# Patient Record
Sex: Female | Born: 1950 | Race: White | Hispanic: No | Smoking: Former smoker
Health system: Southern US, Community
[De-identification: ages and names within clinical notes are randomized; demographics above are authoritative.]

## PROBLEM LIST (undated history)

## (undated) DIAGNOSIS — F419 Anxiety disorder, unspecified: Secondary | ICD-10-CM

## (undated) DIAGNOSIS — Z889 Allergy status to unspecified drugs, medicaments and biological substances status: Secondary | ICD-10-CM

## (undated) DIAGNOSIS — J069 Acute upper respiratory infection, unspecified: Secondary | ICD-10-CM

## (undated) DIAGNOSIS — T7840XA Allergy, unspecified, initial encounter: Secondary | ICD-10-CM

## (undated) DIAGNOSIS — K589 Irritable bowel syndrome without diarrhea: Secondary | ICD-10-CM

## (undated) DIAGNOSIS — I10 Essential (primary) hypertension: Secondary | ICD-10-CM

## (undated) DIAGNOSIS — M1990 Unspecified osteoarthritis, unspecified site: Secondary | ICD-10-CM

## (undated) HISTORY — DX: Irritable bowel syndrome, unspecified: K58.9

## (undated) HISTORY — DX: Allergy status to unspecified drugs, medicaments and biological substances: Z88.9

## (undated) HISTORY — DX: Anxiety disorder, unspecified: F41.9

## (undated) HISTORY — PX: SINUS SURGERY WITH INSTATRAK: SHX5215

## (undated) HISTORY — DX: Acute upper respiratory infection, unspecified: J06.9

## (undated) HISTORY — PX: CHOLECYSTECTOMY: SHX55

## (undated) HISTORY — DX: Unspecified osteoarthritis, unspecified site: M19.90

## (undated) HISTORY — PX: ABDOMINAL HYSTERECTOMY: SHX81

## (undated) HISTORY — DX: Allergy, unspecified, initial encounter: T78.40XA

## (undated) HISTORY — DX: Essential (primary) hypertension: I10

## (undated) HISTORY — PX: SINOSCOPY: SHX187

---

## 1998-03-12 ENCOUNTER — Ambulatory Visit (HOSPITAL_COMMUNITY): Admission: RE | Admit: 1998-03-12 | Discharge: 1998-03-12 | Payer: Self-pay | Admitting: Internal Medicine

## 1998-07-25 ENCOUNTER — Encounter: Payer: Self-pay | Admitting: Internal Medicine

## 1998-07-25 ENCOUNTER — Ambulatory Visit (HOSPITAL_COMMUNITY): Admission: RE | Admit: 1998-07-25 | Discharge: 1998-07-25 | Payer: Self-pay | Admitting: Internal Medicine

## 2004-10-27 ENCOUNTER — Ambulatory Visit (HOSPITAL_COMMUNITY): Admission: RE | Admit: 2004-10-27 | Discharge: 2004-10-27 | Payer: Self-pay | Admitting: Family Medicine

## 2005-06-25 ENCOUNTER — Emergency Department (HOSPITAL_COMMUNITY): Admission: EM | Admit: 2005-06-25 | Discharge: 2005-06-26 | Payer: Self-pay | Admitting: *Deleted

## 2006-01-11 ENCOUNTER — Encounter (HOSPITAL_COMMUNITY): Admission: RE | Admit: 2006-01-11 | Discharge: 2006-02-10 | Payer: Self-pay | Admitting: Preventative Medicine

## 2008-05-16 ENCOUNTER — Ambulatory Visit: Payer: Self-pay | Admitting: Cardiology

## 2008-06-03 ENCOUNTER — Encounter: Payer: Self-pay | Admitting: Cardiology

## 2008-06-03 ENCOUNTER — Ambulatory Visit: Payer: Self-pay | Admitting: Cardiology

## 2008-06-03 DIAGNOSIS — I10 Essential (primary) hypertension: Secondary | ICD-10-CM | POA: Insufficient documentation

## 2008-06-03 DIAGNOSIS — R05 Cough: Secondary | ICD-10-CM

## 2008-06-03 DIAGNOSIS — R9430 Abnormal result of cardiovascular function study, unspecified: Secondary | ICD-10-CM | POA: Insufficient documentation

## 2008-06-03 DIAGNOSIS — F411 Generalized anxiety disorder: Secondary | ICD-10-CM | POA: Insufficient documentation

## 2008-06-03 DIAGNOSIS — K589 Irritable bowel syndrome without diarrhea: Secondary | ICD-10-CM | POA: Insufficient documentation

## 2008-06-03 DIAGNOSIS — K219 Gastro-esophageal reflux disease without esophagitis: Secondary | ICD-10-CM | POA: Insufficient documentation

## 2008-06-03 DIAGNOSIS — R059 Cough, unspecified: Secondary | ICD-10-CM | POA: Insufficient documentation

## 2008-06-03 DIAGNOSIS — R079 Chest pain, unspecified: Secondary | ICD-10-CM | POA: Insufficient documentation

## 2008-06-06 ENCOUNTER — Ambulatory Visit: Payer: Self-pay | Admitting: Cardiovascular Disease

## 2008-06-06 ENCOUNTER — Inpatient Hospital Stay (HOSPITAL_BASED_OUTPATIENT_CLINIC_OR_DEPARTMENT_OTHER): Admission: RE | Admit: 2008-06-06 | Discharge: 2008-06-06 | Payer: Self-pay | Admitting: Cardiovascular Disease

## 2008-07-09 ENCOUNTER — Ambulatory Visit: Payer: Self-pay | Admitting: Cardiology

## 2010-07-05 HISTORY — PX: SHOULDER SURGERY: SHX246

## 2010-11-17 NOTE — Cardiovascular Report (Signed)
NAMEHAZLEY, Misty Clark                  ACCOUNT NO.:  [id number]   MEDICAL RECORD NO.:  [id number]          PATIENT TYPE:  OIB   LOCATION:  1962                         FACILITY:  [facility]   PHYSICIAN:  Verne Carrow, MDDATE OF BIRTH:  05-12-1951   DATE OF PROCEDURE:  DATE OF DISCHARGE:  06/06/2008                            CARDIAC CATHETERIZATION   PRIMARY CARDIOLOGIST:  Learta Codding, MD,FACC   PRIMARY CARE PHYSICIAN:  Dr Ernestine Conrad.   PROCEDURES PERFORMED:  1. Left heart catheterization.  2. Selective coronary angiography.  3. Left ventricular angiogram.   OPERATOR:  Verne Carrow, MD   INDICATION:  Chest pain in a 60 year old Caucasian female with a past  medical history significant for irritable bowel syndrome, as well as  anxiety who has had recent episodes of chest heaviness related to  stressful events.  She underwent a nuclear stress study that showed some  ischemic EKG changes, however, no perfusion defects on this study.   DETAIL OF THE PROCEDURE:  The patient was brought into the outpatient  cardiac catheterization laboratory after signing informed consent for  the procedure.  The right groin was prepped and draped in the sterile  fashion.  A 1% lidocaine was used for local anesthesia.  The modified  Seldinger technique was used to obtain access to the right femoral  artery.  A 4-French sheath was placed in the right femoral artery  without difficulty.  A 4-French JL4 diagnostic catheter was used to  selectively engage and inject the left coronary system.  A 4-French 3DRC  catheter was used to selectively engage and inject the right coronary  artery.  A 4-French pigtail catheter was used across the aortic valve  into the left ventricle.  Following performance of the left ventricular  angiogram,  Pigtail catheter was pullback across the aortic valve with  no significant pressure gradient measured.  The patient tolerated the  procedure well and was taken to  the holding area in stable condition.   ANGIOGRAPHIC FINDINGS:  1. The left main coronary artery bifurcates into the circumflex and      the left anterior descending and has no evidence of disease.  2. Left anterior descending is a large vessel that becomes a moderate      size in the distal portion.  It gives off 3 diagonal branches that      are small to moderate size.  There is no evidence of disease in the      left anterior descending system.  3. Circumflex artery is a moderate-to-large size vessel that gives off      a small early obtuse marginal branch and 2 larger more distal      obtuse marginal branches.  The AV groove circumflex becomes small.      There is no evidence of disease in the circumflex system.  4. The right coronary artery is a large vessel that is dominant.  This      bifurcates into a moderate-sized posterior descending branch and a      moderate-sized posterolateral segment.  There is no evidence of  disease in the right coronary artery.  5. The left ventricular angiogram demonstrates normal left ventricular      systolic function with no wall motion abnormalities.  Ejection      fraction is estimated at 55%.  There is no evidence of mitral      regurgitation.   HEMODYNAMIC DATA:  Central aortic pressure 139/73.  Left ventricular  pressure 145/11, end-diastolic pressure 14.   IMPRESSION:  1. No angiographic evidence of coronary artery disease.  2. Normal left ventricular systolic function.   RECOMMENDATIONS:  No further cardiac workup is indicated at the current  time.  The patient will follow up with Dr. Andee Lineman in 1-2 weeks.      Verne Carrow, MD  Electronically Signed     CM/MEDQ  D:  06/06/2008  T:  06/06/2008  Job:  [id number]   cc:   Learta Codding, MD,FACC  Dr Ernestine Conrad

## 2010-11-17 NOTE — Assessment & Plan Note (Signed)
[facility]                          [facility] OFFICE NOTE   NAME:FULP, DONIESHA LANDAU                         MRN:          [id number]  DATE:07/09/2008                            DOB:          Mar 19, 1951    CARDIOLOGIST:  Learta Codding, MD,FACC   PRIMARY CARE PHYSICIAN:  Dr. Ernestine Conrad   REASON FOR VISIT:  Postcatheterization followup.   HISTORY OF PRESENT ILLNESS:  Ms. Jillyn Hidden is a 60 year old female patient  who recently saw Dr. Andee Lineman secondary to chest pain and shortness of  breath.  She had an exercise Cardiolite study with EKG changes  suggestive of ischemia, but her nuclear images were normal.  Proceeding  with stress echocardiogram versus cardiac catheterization was discussed  with the patient.  She preferred cardiac catheterization for definitive  diagnosis.  Her cardiac catheterization was done by Dr. Clifton James on  June 06, 2008, and demonstrated EF of 55% and no angiographic  evidence of coronary artery disease.  Her central aortic pressure is  139/73 and her LV pressure was 145/11 and her end-diastolic pressure was  14.  In the office today, the patient notes that she is doing well.  She  denies any further chest pain, but continues to shortness of breath with  exertion.  She describes NYHA class II to class IIB symptoms.  She  denies orthopnea, PND.  She denies pedal edema.  She continues to note a  nonproductive cough.  She notes occasional wheezing.  She denies any  further dyspepsia.  She has been off of her proton pump inhibitor for  quite some time now.  She did have a coughing spell few days ago and she  developed significant pain in her lower right chest.  She continues to  note discomfort in that region with certain positioning changes.  She  has been taking ibuprofen with some relief.  Overall, her pain has  improved since it began.   CURRENT MEDICATIONS:  1. Celexa 20 mg daily.  2. Aspirin 81 mg daily.  3. Xanax 0.25 mg at  bedtime.  4. Tylenol p.r.n.  5. Ibuprofen p.r.n.  6. Zyrtec p.r.n.  7. Sudafed p.r.n.   ALLERGIES:  SULFA, CODEINE, AUGMENTIN, BACTRIM.   PHYSICAL EXAMINATION:  GENERAL:  She is a well-nourished, well-developed  female in no distress.  VITAL SIGNS:  Blood pressure is 154/92, pulse 91, weight 163.6 pounds.  HEENT:  Normal.  NECK:  Without JVD.  CARDIAC:  S1 and S2.  Regular rate and rhythm.  LUNGS:  Clear to auscultation bilaterally.  No wheezing, no rhonchi, no  rales.  ABDOMEN:  Soft, nontender.  EXTREMITIES:  No edema.  NEUROLOGIC:  She is alert and oriented x3.  Cranial II through XII  grossly intact.  VASCULAR:  Right femoral arteriotomy site without hematoma or bruit.  CHEST:  She has some tenderness over the lower ribs on the right.  No  obvious deformity is noted.   ASSESSMENT/PLAN:  1. Noncardiac chest pain and shortness of breath.  As noted above, the      patient had normal heart  catheterization with normal left      ventricular function and normal left-sided pressures.  No further      cardiovascular testing is warranted at this time.  2. Chronic cough.  The patient notes occasional wheezing and I      questioned whether or not she may have an element of reactive      airway disease or asthma.  I have asked her to follow up further      with Dr. Loney Hering for further testing.  3. Hypertension.  She has had several readings of blood pressures over      140/90.  She will require medical therapy.  I have recommended the      patient to return to see her primary care physician for treatment      of her hypertension.  4. Chest pain.  She is concerned that she cracked her rib with her      coughing the other day.  Her symptoms have improved, but she is      still tender over that area.  We will set her up for rib films to      rule out the possibility of rib fracture.  Otherwise, she will      continue followup with her primary care physician  5. Gastroesophageal reflux  disease.  The patient denies any further      symptoms of dyspepsia since discontinuing her Prilosec some weeks      ago.  Further treatment of this will be per her primary care      physician.   DISPOSITION:  We will follow up on her chest x-ray.  She can follow up  with Cardiology on a p.r.n. basis.  She should continue followup with  her primary care physician as outlined above.      Tereso Newcomer, PA-C  Electronically Signed      Jonelle Sidle, MD  Electronically Signed   SW/MedQ  DD: 07/09/2008  DT: 07/10/2008  Job #: [id number]   cc:   Dr. Ellin Mayhew, MD,FACC

## 2011-01-07 ENCOUNTER — Encounter: Payer: Self-pay | Admitting: Cardiology

## 2015-06-02 DIAGNOSIS — Z1211 Encounter for screening for malignant neoplasm of colon: Secondary | ICD-10-CM | POA: Diagnosis not present

## 2015-06-02 DIAGNOSIS — E559 Vitamin D deficiency, unspecified: Secondary | ICD-10-CM | POA: Diagnosis not present

## 2015-06-02 DIAGNOSIS — Z124 Encounter for screening for malignant neoplasm of cervix: Secondary | ICD-10-CM | POA: Diagnosis not present

## 2015-06-02 DIAGNOSIS — Z0000 Encounter for general adult medical examination without abnormal findings: Secondary | ICD-10-CM | POA: Diagnosis not present

## 2015-06-02 DIAGNOSIS — Z1231 Encounter for screening mammogram for malignant neoplasm of breast: Secondary | ICD-10-CM | POA: Diagnosis not present

## 2015-06-02 DIAGNOSIS — E7801 Familial hypercholesterolemia: Secondary | ICD-10-CM | POA: Diagnosis not present

## 2015-06-02 DIAGNOSIS — Z23 Encounter for immunization: Secondary | ICD-10-CM | POA: Diagnosis not present

## 2015-06-02 DIAGNOSIS — I10 Essential (primary) hypertension: Secondary | ICD-10-CM | POA: Diagnosis not present

## 2015-06-11 DIAGNOSIS — R300 Dysuria: Secondary | ICD-10-CM | POA: Diagnosis not present

## 2015-06-12 DIAGNOSIS — Z1231 Encounter for screening mammogram for malignant neoplasm of breast: Secondary | ICD-10-CM | POA: Diagnosis not present

## 2015-10-03 DIAGNOSIS — J0100 Acute maxillary sinusitis, unspecified: Secondary | ICD-10-CM | POA: Diagnosis not present

## 2016-01-02 DIAGNOSIS — H40053 Ocular hypertension, bilateral: Secondary | ICD-10-CM | POA: Diagnosis not present

## 2016-04-13 DIAGNOSIS — Z0101 Encounter for examination of eyes and vision with abnormal findings: Secondary | ICD-10-CM | POA: Diagnosis not present

## 2016-06-18 DIAGNOSIS — Z1231 Encounter for screening mammogram for malignant neoplasm of breast: Secondary | ICD-10-CM | POA: Diagnosis not present

## 2016-07-14 DIAGNOSIS — J0100 Acute maxillary sinusitis, unspecified: Secondary | ICD-10-CM | POA: Diagnosis not present

## 2016-07-28 DIAGNOSIS — Z23 Encounter for immunization: Secondary | ICD-10-CM | POA: Diagnosis not present

## 2016-07-28 DIAGNOSIS — E7801 Familial hypercholesterolemia: Secondary | ICD-10-CM | POA: Diagnosis not present

## 2016-07-28 DIAGNOSIS — E559 Vitamin D deficiency, unspecified: Secondary | ICD-10-CM | POA: Diagnosis not present

## 2016-07-28 DIAGNOSIS — J209 Acute bronchitis, unspecified: Secondary | ICD-10-CM | POA: Diagnosis not present

## 2016-07-28 DIAGNOSIS — Z0000 Encounter for general adult medical examination without abnormal findings: Secondary | ICD-10-CM | POA: Diagnosis not present

## 2016-07-28 DIAGNOSIS — Z124 Encounter for screening for malignant neoplasm of cervix: Secondary | ICD-10-CM | POA: Diagnosis not present

## 2016-07-28 DIAGNOSIS — I10 Essential (primary) hypertension: Secondary | ICD-10-CM | POA: Diagnosis not present

## 2016-07-28 DIAGNOSIS — Z1231 Encounter for screening mammogram for malignant neoplasm of breast: Secondary | ICD-10-CM | POA: Diagnosis not present

## 2016-07-28 DIAGNOSIS — Z1211 Encounter for screening for malignant neoplasm of colon: Secondary | ICD-10-CM | POA: Diagnosis not present

## 2016-07-28 DIAGNOSIS — Z6826 Body mass index (BMI) 26.0-26.9, adult: Secondary | ICD-10-CM | POA: Diagnosis not present

## 2016-07-30 DIAGNOSIS — R05 Cough: Secondary | ICD-10-CM | POA: Diagnosis not present

## 2016-09-20 DIAGNOSIS — R300 Dysuria: Secondary | ICD-10-CM | POA: Diagnosis not present

## 2016-09-27 DIAGNOSIS — J309 Allergic rhinitis, unspecified: Secondary | ICD-10-CM | POA: Diagnosis not present

## 2016-09-27 DIAGNOSIS — R300 Dysuria: Secondary | ICD-10-CM | POA: Diagnosis not present

## 2017-03-24 DIAGNOSIS — Z79899 Other long term (current) drug therapy: Secondary | ICD-10-CM | POA: Diagnosis not present

## 2017-03-24 DIAGNOSIS — J209 Acute bronchitis, unspecified: Secondary | ICD-10-CM | POA: Diagnosis not present

## 2017-03-24 DIAGNOSIS — Z8249 Family history of ischemic heart disease and other diseases of the circulatory system: Secondary | ICD-10-CM | POA: Diagnosis not present

## 2017-03-24 DIAGNOSIS — I10 Essential (primary) hypertension: Secondary | ICD-10-CM | POA: Diagnosis not present

## 2017-03-24 DIAGNOSIS — R05 Cough: Secondary | ICD-10-CM | POA: Diagnosis not present

## 2017-03-24 DIAGNOSIS — Z87891 Personal history of nicotine dependence: Secondary | ICD-10-CM | POA: Diagnosis not present

## 2017-05-03 DIAGNOSIS — H524 Presbyopia: Secondary | ICD-10-CM | POA: Diagnosis not present

## 2017-10-14 DIAGNOSIS — R05 Cough: Secondary | ICD-10-CM | POA: Diagnosis not present

## 2017-10-14 DIAGNOSIS — J40 Bronchitis, not specified as acute or chronic: Secondary | ICD-10-CM | POA: Diagnosis not present

## 2017-11-04 DIAGNOSIS — R42 Dizziness and giddiness: Secondary | ICD-10-CM | POA: Diagnosis not present

## 2017-11-04 DIAGNOSIS — H6982 Other specified disorders of Eustachian tube, left ear: Secondary | ICD-10-CM | POA: Diagnosis not present

## 2017-11-04 DIAGNOSIS — I10 Essential (primary) hypertension: Secondary | ICD-10-CM | POA: Diagnosis not present

## 2017-11-09 NOTE — Progress Notes (Deleted)
Subjective: WU:[id number] care, *** HPI: Misty Clark is a 67 y.o. female presenting to clinic today for:  1. ***  Past Medical History:  Diagnosis Date  . Anxiety   . Arthritis   . IBS (irritable bowel syndrome)    Mixed  . Multiple allergies    *** The histories are not reviewed yet. Please review them in the "History" navigator section and refresh this SmartLink. Social History   Socioeconomic History  . Marital status: Widowed    Spouse name: Not on file  . Number of children: Not on file  . Years of education: Not on file  . Highest education level: Not on file  Occupational History  . Not on file  Social Needs  . Financial resource strain: Not on file  . Food insecurity:    Worry: Not on file    Inability: Not on file  . Transportation needs:    Medical: Not on file    Non-medical: Not on file  Tobacco Use  . Smoking status: Former Smoker    Packs/day: 0.50  . Tobacco comment: Quit many years ago.  Substance and Sexual Activity  . Alcohol use: No  . Drug use: Not on file  . Sexual activity: Not on file  Lifestyle  . Physical activity:    Days per week: Not on file    Minutes per session: Not on file  . Stress: Not on file  Relationships  . Social connections:    Talks on phone: Not on file    Gets together: Not on file    Attends religious service: Not on file    Active member of club or organization: Not on file    Attends meetings of clubs or organizations: Not on file    Relationship status: Not on file  . Intimate partner violence:    Fear of current or ex partner: Not on file    Emotionally abused: Not on file    Physically abused: Not on file    Forced sexual activity: Not on file  Other Topics Concern  . Not on file  Social History Narrative  . Not on file   No outpatient medications have been marked as taking for the 11/14/17 encounter (Appointment) with Raliegh Ip, DO.   Family History  Problem Relation Age of Onset  .  Breast cancer Sister   . Heart failure Maternal Grandmother        CHF  . Heart attack Paternal Grandfather        MI  . Coronary artery disease Paternal Grandfather        At a premature age  . Coronary artery disease Other    Allergies  Allergen Reactions  . Amoxicillin-Pot Clavulanate     REACTION: GI upset and rash  . Codeine     REACTION: vomiting  . Sulfonamide Derivatives     REACTION: rash     Health Maintenance: ***  Flu Vaccine: {YES/NO/WILD GNFAO:13086}  Tdap Vaccine: {YES/NO/WILD VHQIO:96295}  - every 34yrs - (<3 lifetime doses or unknown): all wounds -- look up need for Tetanus IG - (>=3 lifetime doses): clean/minor wound if >54yrs from previous; all other wounds if >95yrs from previous Zoster Vaccine: {YES/NO/WILD CARDS:18581} (those >50yo, once) Pneumonia Vaccine: {YES/NO/WILD MWUXL:24401} (those w/ risk factors) - (<30yr) Both: Immunocompromised, cochlear implant, CSF leak, asplenic, sickle cell, Chronic Renal Failure - (<5yr) PPSV-23 only: Heart dz, lung disease, DM, tobacco abuse, alcoholism, cirrhosis/liver disease. - (>42yr): PPSV13 then PPSV23  in 6-12mths;  - (>61yr): repeat PPSV23 once if pt received prior to 68yo and 24yrs have passed  ROS: Per HPI  Objective: Office vital signs reviewed. There were no vitals taken for this visit.  Physical Examination:  General: Awake, alert, *** nourished, No acute distress HEENT: Normal    Neck: No masses palpated. No lymphadenopathy    Ears: Tympanic membranes intact, normal light reflex, no erythema, no bulging    Eyes: PERRLA, extraocular movement in tact, sclera ***    Nose: nasal turbinates moist, *** nasal discharge    Throat: moist mucus membranes, no erythema, *** tonsillar exudate.  Airway is patent Cardio: regular rate and rhythm, S1S2 heard, no murmurs appreciated Pulm: clear to auscultation bilaterally, no wheezes, rhonchi or rales; normal work of breathing on room air GI: soft, non-tender,  non-distended, bowel sounds present x4, no hepatomegaly, no splenomegaly, no masses GU: external vaginal tissue ***, cervix ***, *** punctate lesions on cervix appreciated, *** discharge from cervical os, *** bleeding, *** cervical motion tenderness, *** abdominal/ adnexal masses Extremities: warm, well perfused, No edema, cyanosis or clubbing; +*** pulses bilaterally MSK: *** gait and *** station Skin: dry; intact; no rashes or lesions Neuro: *** Strength and light touch sensation grossly intact, *** DTRs ***/4  Assessment/ Plan: 67 y.o. female   No problem-specific Assessment & Plan notes found for this encounter.   Raliegh Ip, DO [facility] [phone]

## 2017-11-14 ENCOUNTER — Ambulatory Visit: Payer: Self-pay | Admitting: Family Medicine

## 2017-11-18 ENCOUNTER — Ambulatory Visit: Payer: Medicare HMO | Admitting: Family Medicine

## 2017-11-18 ENCOUNTER — Other Ambulatory Visit: Payer: Self-pay | Admitting: Family Medicine

## 2017-11-18 ENCOUNTER — Encounter: Payer: Self-pay | Admitting: Family Medicine

## 2017-11-18 VITALS — BP 138/82 | HR 96 | Temp 97.3°F | Ht 65.0 in | Wt 158.0 lb

## 2017-11-18 DIAGNOSIS — Z13220 Encounter for screening for lipoid disorders: Secondary | ICD-10-CM

## 2017-11-18 DIAGNOSIS — Z1211 Encounter for screening for malignant neoplasm of colon: Secondary | ICD-10-CM

## 2017-11-18 DIAGNOSIS — Z7689 Persons encountering health services in other specified circumstances: Secondary | ICD-10-CM

## 2017-11-18 DIAGNOSIS — J301 Allergic rhinitis due to pollen: Secondary | ICD-10-CM | POA: Diagnosis not present

## 2017-11-18 DIAGNOSIS — Z8349 Family history of other endocrine, nutritional and metabolic diseases: Secondary | ICD-10-CM

## 2017-11-18 DIAGNOSIS — F411 Generalized anxiety disorder: Secondary | ICD-10-CM

## 2017-11-18 DIAGNOSIS — Z1329 Encounter for screening for other suspected endocrine disorder: Secondary | ICD-10-CM

## 2017-11-18 DIAGNOSIS — Z130 Encounter for screening for diseases of the blood and blood-forming organs and certain disorders involving the immune mechanism: Secondary | ICD-10-CM | POA: Diagnosis not present

## 2017-11-18 DIAGNOSIS — I10 Essential (primary) hypertension: Secondary | ICD-10-CM

## 2017-11-18 DIAGNOSIS — R69 Illness, unspecified: Secondary | ICD-10-CM | POA: Diagnosis not present

## 2017-11-18 MED ORDER — LORATADINE 10 MG PO TABS: 10.0000 mg | ORAL_TABLET | Freq: Every day | ORAL | 11 refills | Status: DC

## 2017-11-18 MED ORDER — TRIAMTERENE-HCTZ 37.5-25 MG PO TABS: 1.0000 | ORAL_TABLET | Freq: Every day | ORAL | 0 refills | Status: DC

## 2017-11-18 MED ORDER — CITALOPRAM HYDROBROMIDE 20 MG PO TABS: 20.0000 mg | ORAL_TABLET | Freq: Every day | ORAL | 0 refills | Status: DC

## 2017-11-18 MED ORDER — ALPRAZOLAM 0.5 MG PO TABS: 0.2500 mg | ORAL_TABLET | Freq: Two times a day (BID) | ORAL | 0 refills | Status: DC | PRN

## 2017-11-18 NOTE — Patient Instructions (Addendum)
Stop lisinopril HCTZ.  Start the triamterene/HCTZ.  Monitor your blood pressures daily.  Your goal blood pressure is less than 150/90.  If you are seeing blood pressures less than 110/60, please contact the office.  I have refilled the Celexa and the Xanax.  We discussed that Xanax is for use in severe cases only.  Do not operate heavy machinery, do not drive, do not drink alcohol while taking.  This medication is only intended to aid you in any panic attacks while we get you back on Celexa.  I have ordered the colon cancer screening kit for you.  You will be contacted about this.  It takes about 2 to 3 weeks to receive the results once you send it back.  If it is negative, we will plan to check for colon cancer every 3 years.  Schedule your mammogram when you check out today.  Plan to see me back in 4 weeks for anxiety and blood pressure.  Taking the medicine as directed and not missing any doses is one of the best things you can do to treat your anxiety.  Here are some things to keep in mind:  1) Side effects (stomach upset, some increased anxiety) may happen before you notice a benefit.  These side effects typically go away over time. 2) Changes to your dose of medicine or a change in medication all together is sometimes necessary 3) Most people need to be on medication at least 12 months 4) Many people will notice an improvement within two weeks but the full effect of the medication can take up to 4-6 weeks 5) Stopping the medication when you start feeling better often results in a return of symptoms 6) Never discontinue your medication without contacting a health care professional first.  Some medications require gradual discontinuation/ taper and can make you sick if you stop them abruptly.  If your symptoms worsen or you have thoughts of suicide/homicide, PLEASE SEEK IMMEDIATE MEDICAL ATTENTION.  You may always call:  National Suicide Hotline: (424) 827-6677 [place]:  774-637-7742 Crisis Recovery in [place]: 512-851-1930   These are available 24 hours a day, 7 days a week.

## 2017-11-18 NOTE — Assessment & Plan Note (Addendum)
Does not appear very anxious on exam.  However, I do think that she has true panic symptoms that are related to significant stressors.  She seemed to do well on Celexa in the past.  I have also given her a small supply of Xanax to use over the next couple of weeks if needed for severe symptoms.  We discussed proper use of this medication.  Caution sedation.  We discussed that this is a one-time prescription while we get her back onto the Celexa.  The narcotic database was reviewed and there were no red flags.  Last fill was in 2018.  I have encouraged her to continue with therapy.  She will follow-up in 4 weeks for recheck.

## 2017-11-18 NOTE — Progress Notes (Signed)
Subjective: UY:[id number] care, HTN HPI: Misty Clark is a 67 y.o. female presenting to clinic today for:  1. Hypertension Patient reports Blood pressure at home: 140-160/80-90s; she reports that she is actually been using lisinopril/HCTZ every other day because she gets cramping in her lower extremities with this medication.  She notes that she was previously on triamterene/HCTZ, which she tolerated very well.  Lisinopril/HCTZ was prescribed by urgent care whom she saw for an upper respiratory infection.  She would like to go back on her previous blood pressure medicine. Denies headache, dizziness, visual changes, nausea, vomiting, chest pain, LE swelling, abdominal pain or shortness of breath.  2.  Anxiety Patient notes that she has had issues with anxiety and panic since the passing of her sister 5 years ago.  She notes her sister passed away from metastatic breast cancer.  Over the last few years, she has been having anxiety and panic episodes related to the death of her son-in-law.  She notes that her grandchildren have PTSD like behaviors related to the death of their father.  She describes this as very difficult to deal with.  She notes an upcoming trial related to the death of her son-in-law.  She does note that this increases her symptoms.  She was previously prescribed Celexa and Xanax.  She reported seldom use of the Xanax but does feel like she probably needs it now.  She brings me a bottle from 2018 that is empty.  She reports that Celexa helps control her symptoms quite a bit but that she has run out of the medication.  No SI, HI, visual or auditory hallucinations.  3.  Sinus symptoms Patient reports sinus pressure, postnasal drip and intermittent cough.  No fevers, chills.  No purulence from nares.  No hemoptysis.  No shortness of breath or wheeze.  She was seen at urgent care for symptoms and treated.  She notes that she takes Zyrtec daily but she wonders if this is not effective since  she has been on a Versed many years.  She wonders if there is an alternative.  4. Preventative care Has not had a mammogram in almost a year.  She was previously getting them done with the [facility] in [place].  Family history significant for breast cancer in her sister at age 37.  Age of death was 98.  Was getting colon cancer screening through [facility] yearly.  Has not had this done in quite some time.  No family history or personal history of colon cancers.  No hematochezia or melena.  She had a total hysterectomy in her 48s after postpartum hemorrhage.  She notes that she was on oral hormones for many years and then discontinued them after studies came out about adverse effects from prolonged use.  No abnormal vaginal bleeding.  Has not been getting any Paps because she had a total hysterectomy.  She thinks that she is up-to-date on her pneumococcal and tetanus shots.  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  She believes that she was tested for hepatitis C with Dr. Wenda Overland and that this was negative.  She has not had fasting labs in over a year.   Past Medical History:  Diagnosis Date  . Allergy   . Anxiety   . Arthritis   . Hypertension   . IBS (irritable bowel syndrome)    Mixed  . Multiple allergies    Past Surgical History:  Procedure Laterality Date  .  ABDOMINAL HYSTERECTOMY    . CHOLECYSTECTOMY    . SHOULDER SURGERY Left 2012  . SINUS SURGERY WITH INSTATRAK     Social History   Socioeconomic History  . Marital status: Widowed    Spouse name: Not on file  . Number of children: Not on file  . Years of education: Not on file  . Highest education level: Not on file  Occupational History  . Occupation: Retired Nursing    Comment: Scientist, research (physical sciences) [employer]  . Financial resource strain: Not on file  . Food insecurity:    Worry: Not on file    Inability: Not on file  . Transportation needs:    Medical: Not on file    Non-medical: Not on file   Tobacco Use  . Smoking status: Former Smoker    Packs/day: 0.50  . Smokeless tobacco: Never Used  . Tobacco comment: Quit many years ago.  Substance and Sexual Activity  . Alcohol use: No  . Drug use: Never  . Sexual activity: Not on file  Lifestyle  . Physical activity:    Days per week: Not on file    Minutes per session: Not on file  . Stress: Not on file  Relationships  . Social connections:    Talks on phone: Not on file    Gets together: Not on file    Attends religious service: Not on file    Active member of club or organization: Not on file    Attends meetings of clubs or organizations: Not on file    Relationship status: Not on file  . Intimate partner violence:    Fear of current or ex partner: Not on file    Emotionally abused: Not on file    Physically abused: Not on file    Forced sexual activity: Not on file  Other Topics Concern  . Not on file  Social History Narrative  . Not on file   Current Meds  Medication Sig  . ipratropium (ATROVENT) 0.06 % nasal spray 2 sprays by Each Nare route Three (3) times a day.  . [DISCONTINUED] lisinopril-hydrochlorothiazide (ZESTORETIC) 10-12.5 MG tablet Take by mouth.   Family History  Problem Relation Age of Onset  . Heart failure Maternal Grandmother        CHF  . Breast cancer Sister   . Heart attack Paternal Grandfather        MI  . Coronary artery disease Paternal Grandfather        At a premature age  . Coronary artery disease Other   . Colon cancer Other   . Prostate cancer Other   . Ovarian cancer Other   . Asthma Mother   . Heart disease Father   . Hypertension Daughter    Allergies  Allergen Reactions  . Codeine     REACTION: vomiting  . Sulfonamide Derivatives     REACTION: rash     Health Maintenance: mammo, colon cancer screening  ROS: Per HPI  Objective: Office vital signs reviewed. BP 138/82   Pulse 96   Temp (!) 97.3 F (36.3 C) (Oral)   Ht 5' 5" (1.651 m)   Wt 158 lb (71.7 kg)    BMI 26.29 kg/m   Physical Examination:  General: Awake, alert, well nourished, No acute distress HEENT: Normal    Neck: No masses palpated. No lymphadenopathy; no goiter     Eyes: PERRLA, extraocular movement in tact, sclera white; no exophthalmos    Nose: nasal  turbinates moist, no nasal discharge    Throat: moist mucus membranes, no erythema, no tonsillar exudate.  Airway is patent Cardio: regular rate and rhythm, S1S2 heard, no murmurs appreciated Pulm: clear to auscultation bilaterally, no wheezes, rhonchi or rales; normal work of breathing on room air Extremities: warm, well perfused, No edema, cyanosis or clubbing; +2 pulses bilaterally MSK: Normal gait and normal station Psych: Mood stable, speech normal, affect appropriate, pleasant, interactive, does not appear to be responding to internal stimuli. Depression screen [facility] 2/9 11/18/2017  Decreased Interest 0  Down, Depressed, Hopeless 0  PHQ - 2 Score 0   GAD 7 : Generalized Anxiety Score 11/18/2017  Nervous, Anxious, on Edge 1  Control/stop worrying 0  Worry too much - different things 1  Trouble relaxing 0  Restless 0  Easily annoyed or irritable 0  Afraid - awful might happen 0  Total GAD 7 Score 2  Anxiety Difficulty Not difficult at all     Assessment/ Plan:  67 y.o. female   Essential hypertension, benign Blood pressure within normal limits for age.  Because she is having adverse side effects from the lisinopril/HCTZ, will discontinue.  I have placed her back on the triamterene/HCTZ.  She will come in for fasting labs on Wednesday.  Plan to check CMP and lipid panel.  Patient will continue to monitor blood pressures at home.  Goal blood pressure less than 150/90.  She will contact me if she is having any blood pressures less than 110/60.  Follow-up in 4 weeks.  Generalized anxiety disorder Does not appear very anxious on exam.  However, I do think that she has true panic symptoms that are related to significant  stressors.  She seemed to do well on Celexa in the past.  I have also given her a small supply of Xanax to use over the next couple of weeks if needed for severe symptoms.  We discussed proper use of this medication.  Caution sedation.  We discussed that this is a one-time prescription while we get her back onto the Celexa.  The narcotic database was reviewed and there were no red flags.  Last fill was in 2018.  I have encouraged her to continue with therapy.  She will follow-up in 4 weeks for recheck.  Establishing care with new doctor, encounter for Release of information form completed.  Will obtain records from Dr. Irene Shipper office.  In the interim, patient to schedule mammogram.  Cologuard as below.  We discussed DEXA scan during today's visit.  Unsure of last.  Will await vaccination history and preventative care records from previous PCP.  Seasonal allergic rhinitis due to pollen Discontinue Zyrtec.  Start Claritin.  If she continues to have symptoms, may consider adding Flonase.  No evidence of infectious processes on exam.  Colon cancer screening Cologuard ordered.  Will contact patient once results are available. - Cologuard  Screening for malignant neoplasm of colon - Cologuard  Screening for lipid disorders - Lipid Panel; Future  Screening for deficiency anemia - CBC with Differential; Future  Screening for thyroid disorder - TSH; Future  Family history of thyroid disease in mother - TSH; Future  Meds ordered this encounter  Medications  . citalopram (CELEXA) 20 MG tablet    Sig: Take 1 tablet (20 mg total) by mouth daily.    Dispense:  90 tablet    Refill:  0  . ALPRAZolam (XANAX) 0.5 MG tablet    Sig: Take 0.5-1 tablets (0.25-0.5 mg total) by  mouth 2 (two) times daily as needed for anxiety.    Dispense:  14 tablet    Refill:  0  . triamterene-hydrochlorothiazide (MAXZIDE-25) 37.5-25 MG tablet    Sig: Take 1 tablet by mouth daily.    Dispense:  90 tablet    Refill:   0  . loratadine (CLARITIN) 10 MG tablet    Sig: Take 1 tablet (10 mg total) by mouth daily.    Dispense:  30 tablet    Refill:  11   Orders Placed This Encounter  Procedures  . Cologuard  . TSH  . Lipid Panel  . CBC with Differential  . [place], [place] [phone]

## 2017-11-18 NOTE — Assessment & Plan Note (Signed)
Blood pressure within normal limits for age.  Because she is having adverse side effects from the lisinopril/HCTZ, will discontinue.  I have placed her back on the triamterene/HCTZ.  She will come in for fasting labs on Wednesday.  Plan to check CMP and lipid panel.  Patient will continue to monitor blood pressures at home.  Goal blood pressure less than 150/90.  She will contact me if she is having any blood pressures less than 110/60.  Follow-up in 4 weeks.

## 2017-11-23 ENCOUNTER — Other Ambulatory Visit: Payer: Medicare HMO

## 2017-11-23 DIAGNOSIS — I10 Essential (primary) hypertension: Secondary | ICD-10-CM | POA: Diagnosis not present

## 2017-11-23 DIAGNOSIS — Z1329 Encounter for screening for other suspected endocrine disorder: Secondary | ICD-10-CM | POA: Diagnosis not present

## 2017-11-23 DIAGNOSIS — Z8349 Family history of other endocrine, nutritional and metabolic diseases: Secondary | ICD-10-CM | POA: Diagnosis not present

## 2017-11-23 DIAGNOSIS — Z130 Encounter for screening for diseases of the blood and blood-forming organs and certain disorders involving the immune mechanism: Secondary | ICD-10-CM | POA: Diagnosis not present

## 2017-11-23 DIAGNOSIS — Z13220 Encounter for screening for lipoid disorders: Secondary | ICD-10-CM

## 2017-11-24 LAB — CMP14+EGFR
A/G RATIO: 2 (ref 1.2–2.2)
ALBUMIN: 4.7 g/dL (ref 3.6–4.8)
ALT: 14 IU/L (ref 0–32)
AST: 12 IU/L (ref 0–40)
Alkaline Phosphatase: 85 IU/L (ref 39–117)
BUN / CREAT RATIO: 18 (ref 12–28)
BUN: 11 mg/dL (ref 8–27)
Bilirubin Total: 0.6 mg/dL (ref 0.0–1.2)
CALCIUM: 9.9 mg/dL (ref 8.7–10.3)
CO2: 26 mmol/L (ref 20–29)
Chloride: 93 mmol/L — ABNORMAL LOW (ref 96–106)
Creatinine, Ser: 0.62 mg/dL (ref 0.57–1.00)
GFR calc Af Amer: 108 mL/min/{1.73_m2} (ref >59)
GFR, EST NON AFRICAN AMERICAN: 94 mL/min/{1.73_m2} (ref >59)
GLOBULIN, TOTAL: 2.3 g/dL (ref 1.5–4.5)
Glucose: 100 mg/dL — ABNORMAL HIGH (ref 65–99)
POTASSIUM: 4.6 mmol/L (ref 3.5–5.2)
SODIUM: 133 mmol/L — AB (ref 134–144)
Total Protein: 7 g/dL (ref 6.0–8.5)

## 2017-11-24 LAB — CBC WITH DIFFERENTIAL/PLATELET
Basophils Absolute: 0 10*3/uL (ref 0.0–0.2)
Basos: 1 %
EOS (ABSOLUTE): 0 10*3/uL (ref 0.0–0.4)
Eos: 1 %
Hematocrit: 41 % (ref 34.0–46.6)
Hemoglobin: 14.5 g/dL (ref 11.1–15.9)
IMMATURE GRANULOCYTES: 0 %
Immature Grans (Abs): 0 10*3/uL (ref 0.0–0.1)
LYMPHS ABS: 1.4 10*3/uL (ref 0.7–3.1)
Lymphs: 23 %
MCH: 30.7 pg (ref 26.6–33.0)
MCHC: 35.4 g/dL (ref 31.5–35.7)
MCV: 87 fL (ref 79–97)
MONOS ABS: 0.4 10*3/uL (ref 0.1–0.9)
Monocytes: 7 %
NEUTROS ABS: 4.1 10*3/uL (ref 1.4–7.0)
NEUTROS PCT: 68 %
PLATELETS: 240 10*3/uL (ref 150–450)
RBC: 4.72 x10E6/uL (ref 3.77–5.28)
RDW: 12.6 % (ref 12.3–15.4)
WBC: 6 10*3/uL (ref 3.4–10.8)

## 2017-11-24 LAB — LIPID PANEL
CHOL/HDL RATIO: 3.6 ratio (ref 0.0–4.4)
CHOLESTEROL TOTAL: 225 mg/dL — AB (ref 100–199)
HDL: 63 mg/dL (ref >39)
LDL CALC: 137 mg/dL — AB (ref 0–99)
Triglycerides: 124 mg/dL (ref 0–149)
VLDL CHOLESTEROL CAL: 25 mg/dL (ref 5–40)

## 2017-11-24 LAB — TSH: TSH: 1.88 u[IU]/mL (ref 0.450–4.500)

## 2017-12-02 ENCOUNTER — Telehealth: Payer: Self-pay | Admitting: *Deleted

## 2017-12-02 NOTE — Progress Notes (Signed)
The order went through. The expiration date must've been changed when it was ordered. It usually defaults to 1 year out.

## 2017-12-16 NOTE — Progress Notes (Deleted)
Subjective: CC: f/u HTN, HLD, GAD PCP: Raliegh Ip, DO ZOX:WRUEA L Aybar is a 67 y.o. female presenting to clinic today for:  1. Hypertension Patient reports Blood pressure at home: ***; Meds: Compliant with triamterene/HCTZ, Side effects: ***  ROS: Denies headache, dizziness, visual changes, nausea, vomiting, chest pain, LE swelling, abdominal pain or shortness of breath.  2.  Hyperlipidemia Patient noted to have an ASCVD score of 10.7% on her recent cholesterol panel.  She is not currently on a statin.  She wanted to wait to today's visit before starting anything.  Diet is composed of ***.  Exercise ***.  Medical history significant for hypertension.  3. GAD Patient was reinitiated on Celexa and given a small amount of Xanax to get her start back on the SSRI.  She notes that anxiety symptoms have been ***.  She required ***Xanax pills.  ROS: Per HPI  Allergies  Allergen Reactions  . Codeine     REACTION: vomiting  . Sulfonamide Derivatives     REACTION: rash   Past Medical History:  Diagnosis Date  . Allergy   . Anxiety   . Arthritis   . Hypertension   . IBS (irritable bowel syndrome)    Mixed  . Multiple allergies     Current Outpatient Medications:  .  ALPRAZolam (XANAX) 0.5 MG tablet, Take 0.5-1 tablets (0.25-0.5 mg total) by mouth 2 (two) times daily as needed for anxiety., Disp: 14 tablet, Rfl: 0 .  citalopram (CELEXA) 20 MG tablet, Take 1 tablet (20 mg total) by mouth daily., Disp: 90 tablet, Rfl: 0 .  ipratropium (ATROVENT) 0.06 % nasal spray, 2 sprays by Each Nare route Three (3) times a day., Disp: , Rfl:  .  loratadine (CLARITIN) 10 MG tablet, Take 1 tablet (10 mg total) by mouth daily., Disp: 30 tablet, Rfl: 11 .  triamterene-hydrochlorothiazide (MAXZIDE-25) 37.5-25 MG tablet, Take 1 tablet by mouth daily., Disp: 90 tablet, Rfl: 0 Social History   Socioeconomic History  . Marital status: Widowed    Spouse name: Not on file  . Number of children:  Not on file  . Years of education: Not on file  . Highest education level: Not on file  Occupational History  . Occupation: Retired Nursing    Comment: Engineer, [employer]  Social Needs  . Financial resource strain: Not on file  . Food insecurity:    Worry: Not on file    Inability: Not on file  . Transportation needs:    Medical: Not on file    Non-medical: Not on file  Tobacco Use  . Smoking status: Former Smoker    Packs/day: 0.50  . Smokeless tobacco: Never Used  . Tobacco comment: Quit many years ago.  Substance and Sexual Activity  . Alcohol use: No  . Drug use: Never  . Sexual activity: Not on file  Lifestyle  . Physical activity:    Days per week: Not on file    Minutes per session: Not on file  . Stress: Not on file  Relationships  . Social connections:    Talks on phone: Not on file    Gets together: Not on file    Attends religious service: Not on file    Active member of club or organization: Not on file    Attends meetings of clubs or organizations: Not on file    Relationship status: Not on file  . Intimate partner violence:    Fear of current or ex partner: Not  on file    Emotionally abused: Not on file    Physically abused: Not on file    Forced sexual activity: Not on file  Other Topics Concern  . Not on file  Social History Narrative  . Not on file   Family History  Problem Relation Age of Onset  . Heart failure Maternal Grandmother        CHF  . Breast cancer Sister   . Heart attack Paternal Grandfather        MI  . Coronary artery disease Paternal Grandfather        At a premature age  . Coronary artery disease Other   . Colon cancer Other   . Prostate cancer Other   . Ovarian cancer Other   . Asthma Mother   . Thyroid disease Mother   . Heart disease Father   . Hypertension Daughter     Objective: Office vital signs reviewed. There were no vitals taken for this visit.  Physical Examination:  General: Awake, alert, ***  nourished, No acute distress HEENT: Normal    Neck: No masses palpated. No lymphadenopathy    Ears: Tympanic membranes intact, normal light reflex, no erythema, no bulging    Eyes: PERRLA, extraocular membranes intact, sclera ***    Nose: nasal turbinates moist, *** nasal discharge    Throat: moist mucus membranes, no erythema, *** tonsillar exudate.  Airway is patent Cardio: regular rate and rhythm, S1S2 heard, no murmurs appreciated Pulm: clear to auscultation bilaterally, no wheezes, rhonchi or rales; normal work of breathing on room air GI: soft, non-tender, non-distended, bowel sounds present x4, no hepatomegaly, no splenomegaly, no masses GU: external vaginal tissue ***, cervix ***, *** punctate lesions on cervix appreciated, *** discharge from cervical os, *** bleeding, *** cervical motion tenderness, *** abdominal/ adnexal masses Extremities: warm, well perfused, No edema, cyanosis or clubbing; +*** pulses bilaterally MSK: *** gait and *** station Skin: dry; intact; no rashes or lesions Neuro: *** Strength and light touch sensation grossly intact, *** DTRs ***/4  Assessment/ Plan: 67 y.o. female   ***  No orders of the defined types were placed in this encounter.  No orders of the defined types were placed in this encounter.    Raliegh IpAshly M Gottschalk, DO [facility] [phone]

## 2017-12-19 ENCOUNTER — Ambulatory Visit: Payer: Medicare HMO | Admitting: Family Medicine

## 2017-12-21 ENCOUNTER — Encounter: Payer: Self-pay | Admitting: Family Medicine

## 2017-12-23 NOTE — Progress Notes (Signed)
Subjective: CC: HTN PCP: Misty Ip, DO JYN:WGNFA [name] is a 67 y.o. female presenting to clinic today for:  1. Hypertension Patient was seen 1 month ago to establish care.  She was having adverse side effects from lisinopril/hydrochlorothiazide and requested to be placed back on her previous blood pressure medication, triamterene/hydrochlorothiazide.  She reports that her Blood pressure at home: 120/70s; Meds: Compliant with triamterene/hydrochlorothiazide, Side effects: none.  She is doing much better on this combination.  ROS: Denies headache, dizziness, visual changes, nausea, vomiting, chest pain, LE swelling, abdominal pain or shortness of breath.  2.  Hyperlipidemia Patient was found to have an ASCVD risk score of 10.7%.  She has a fairly healthy lifestyle.  She is active and eats fairly healthy.  She notes a family history of hyperlipidemia in her father.  Most of the family members live well into their 65s and 90s.  Her paternal grandfather had early CAD.  3.  Anxiety Patient was seen 1 month ago to establish care.  Celexa was started.  A small amount of Xanax was provided to bridge her while she started Celexa.  She reports that things have been going well on Celexa.  She has had to use no Xanax since last visit.  She notes improvement in depressive symptoms and reduction in anxiety.  She does note some GI side effects but those seem to be subsiding.  ROS: Per HPI  Allergies  Allergen Reactions  . Codeine     REACTION: vomiting  . Sulfonamide Derivatives     REACTION: rash   Past Medical History:  Diagnosis Date  . Allergy   . Anxiety   . Arthritis   . Hypertension   . IBS (irritable bowel syndrome)    Mixed  . Multiple allergies     Current Outpatient Medications:  .  ALPRAZolam (XANAX) 0.5 MG tablet, Take 0.5-1 tablets (0.25-0.5 mg total) by mouth 2 (two) times daily as needed for anxiety., Disp: 14 tablet, Rfl: 0 .  citalopram (CELEXA) 20 MG tablet,  Take 1 tablet (20 mg total) by mouth daily., Disp: 90 tablet, Rfl: 0 .  ipratropium (ATROVENT) 0.06 % nasal spray, 2 sprays by Each Nare route Three (3) times a day., Disp: , Rfl:  .  loratadine (CLARITIN) 10 MG tablet, Take 1 tablet (10 mg total) by mouth daily., Disp: 30 tablet, Rfl: 11 .  triamterene-hydrochlorothiazide (MAXZIDE-25) 37.5-25 MG tablet, Take 1 tablet by mouth daily., Disp: 90 tablet, Rfl: 0 Social History   Socioeconomic History  . Marital status: Widowed    Spouse name: Not on file  . Number of children: Not on file  . Years of education: Not on file  . Highest education level: Not on file  Occupational History  . Occupation: Retired Nursing    Comment: Engineer, [employer]  Social Needs  . Financial resource strain: Not on file  . Food insecurity:    Worry: Not on file    Inability: Not on file  . Transportation needs:    Medical: Not on file    Non-medical: Not on file  Tobacco Use  . Smoking status: Former Smoker    Packs/day: 0.50  . Smokeless tobacco: Never Used  . Tobacco comment: Quit many years ago.  Substance and Sexual Activity  . Alcohol use: No  . Drug use: Never  . Sexual activity: Not on file  Lifestyle  . Physical activity:    Days per week: Not on file  Minutes per session: Not on file  . Stress: Not on file  Relationships  . Social connections:    Talks on phone: Not on file    Gets together: Not on file    Attends religious service: Not on file    Active member of club or organization: Not on file    Attends meetings of clubs or organizations: Not on file    Relationship status: Not on file  . Intimate partner violence:    Fear of current or ex partner: Not on file    Emotionally abused: Not on file    Physically abused: Not on file    Forced sexual activity: Not on file  Other Topics Concern  . Not on file  Social History Narrative  . Not on file   Family History  Problem Relation Age of Onset  . Heart failure Maternal  Grandmother        CHF  . Breast cancer Sister   . Heart attack Paternal Grandfather        MI  . Coronary artery disease Paternal Grandfather        At a premature age  . Coronary artery disease Other   . Colon cancer Other   . Prostate cancer Other   . Ovarian cancer Other   . Asthma Mother   . Thyroid disease Mother   . Heart disease Father   . Hypertension Daughter     Objective: Office vital signs reviewed. BP 129/78   Pulse (!) 104   Temp 98.1 F (36.7 C) (Oral)   Ht 5\' 5"  (1.651 m)   Wt 154 lb (69.9 kg)   BMI 25.63 kg/m   Physical Examination:  General: Awake, alert, well nourished, No acute distress HEENT: Normal    Eyes: PERRLA, extraocular membranes intact, sclera white  Cardio: mildly tachycardic w/ rhythm, S1S2 heard, no murmurs appreciated Pulm: clear to auscultation bilaterally, no wheezes, rhonchi or rales; normal work of breathing on room air Extremities: warm, well perfused, No edema, cyanosis or clubbing; +2 pulses bilaterally MSK: normal gait and normal station Skin: dry; intact; no rashes or lesions  Depression screen [facility] 2/9 12/26/2017 11/18/2017  Decreased Interest 0 0  Down, Depressed, Hopeless - 0  PHQ - 2 Score 0 0   GAD 7 : Generalized Anxiety Score 12/26/2017 11/18/2017  Nervous, Anxious, on Edge 0 1  Control/stop worrying 0 0  Worry too much - different things 0 1  Trouble relaxing 0 0  Restless 0 0  Easily annoyed or irritable 0 0  Afraid - awful might happen 0 0  Total GAD 7 Score 0 2  Anxiety Difficulty Not difficult at all Not difficult at all    Assessment/ Plan: 67 y.o. female   Essential hypertension, benign Well-controlled on current regimen.  No changes made.  Maxide refill x6 months.  Follow-up in 3 months or sooner if needed.  Generalized anxiety disorder Well-controlled on Celexa 20 mg daily.  Has not had any need for benzodiazepine.  She will follow-up in 3 months or sooner if needed.  Mixed hyperlipidemia New  diagnosis for patient.  ASCVD risk score of 10.7%.  Lipitor 40 mg nightly prescribed.  We will plan to recheck direct LDL in 3 months.   Meds ordered this encounter  Medications  . citalopram (CELEXA) 20 MG tablet    Sig: Take 1 tablet (20 mg total) by mouth daily.    Dispense:  90 tablet    Refill:  1  .  triamterene-hydrochlorothiazide (MAXZIDE-25) 37.5-25 MG tablet    Sig: Take 1 tablet by mouth daily.    Dispense:  90 tablet    Refill:  1  . atorvastatin (LIPITOR) 40 MG tablet    Sig: Take 1 tablet (40 mg total) by mouth daily.    Dispense:  90 tablet    Refill:  3     Misty Clark SkainsM Emmilia Sowder, DO [facility] [phone]

## 2017-12-26 ENCOUNTER — Ambulatory Visit (INDEPENDENT_AMBULATORY_CARE_PROVIDER_SITE_OTHER): Payer: Medicare HMO | Admitting: Family Medicine

## 2017-12-26 ENCOUNTER — Encounter: Payer: Self-pay | Admitting: Family Medicine

## 2017-12-26 VITALS — BP 129/78 | HR 104 | Temp 98.1°F | Ht 65.0 in | Wt 154.0 lb

## 2017-12-26 DIAGNOSIS — I10 Essential (primary) hypertension: Secondary | ICD-10-CM

## 2017-12-26 DIAGNOSIS — R69 Illness, unspecified: Secondary | ICD-10-CM | POA: Diagnosis not present

## 2017-12-26 DIAGNOSIS — E782 Mixed hyperlipidemia: Secondary | ICD-10-CM | POA: Diagnosis not present

## 2017-12-26 DIAGNOSIS — Z1231 Encounter for screening mammogram for malignant neoplasm of breast: Secondary | ICD-10-CM | POA: Diagnosis not present

## 2017-12-26 DIAGNOSIS — F411 Generalized anxiety disorder: Secondary | ICD-10-CM | POA: Diagnosis not present

## 2017-12-26 LAB — HM MAMMOGRAPHY

## 2017-12-26 MED ORDER — CITALOPRAM HYDROBROMIDE 20 MG PO TABS: 20.0000 mg | ORAL_TABLET | Freq: Every day | ORAL | 1 refills | Status: DC

## 2017-12-26 MED ORDER — TRIAMTERENE-HCTZ 37.5-25 MG PO TABS: 1.0000 | ORAL_TABLET | Freq: Every day | ORAL | 1 refills | Status: DC

## 2017-12-26 MED ORDER — ATORVASTATIN CALCIUM 40 MG PO TABS: 40.0000 mg | ORAL_TABLET | Freq: Every day | ORAL | 3 refills | Status: DC

## 2017-12-26 NOTE — Assessment & Plan Note (Signed)
Well-controlled on current regimen.  No changes made.  Maxide refill x6 months.  Follow-up in 3 months or sooner if needed.

## 2017-12-26 NOTE — Assessment & Plan Note (Signed)
Well-controlled on Celexa 20 mg daily.  Has not had any need for benzodiazepine.  She will follow-up in 3 months or sooner if needed.

## 2017-12-26 NOTE — Patient Instructions (Signed)
We have started you on a cholesterol medication called atorvastatin (brand-name is Lipitor) today.  Your ASCVD risk score was 10.7%.  This is your ten-year risk score for stroke or heart attack.  We will plan to recheck your cholesterol in 3 months.  Refills have been sent on your blood pressure medicine and your Celexa.   Cholesterol Cholesterol is a white, waxy, fat-like substance that is needed by the human body in small amounts. The liver makes all the cholesterol we need. Cholesterol is carried from the liver by the blood through the blood vessels. Deposits of cholesterol (plaques) may build up on blood vessel (artery) walls. Plaques make the arteries narrower and stiffer. Cholesterol plaques increase the risk for heart attack and stroke. You cannot feel your cholesterol level even if it is very high. The only way to know that it is high is to have a blood test. Once you know your cholesterol levels, you should keep a record of the test results. Work with your health care provider to keep your levels in the desired range. What do the results mean?  Total cholesterol is a rough measure of all the cholesterol in your blood.  LDL (low-density lipoprotein) is the "bad" cholesterol. This is the type that causes plaque to build up on the artery walls. You want this level to be low.  HDL (high-density lipoprotein) is the "good" cholesterol because it cleans the arteries and carries the LDL away. You want this level to be high.  Triglycerides are fat that the body can either burn for energy or store. High levels are closely linked to heart disease. What are the desired levels of cholesterol?  Total cholesterol below 200.  LDL below 100 for people who are at risk, below 70 for people at very high risk.  HDL above 40 is good. A level of 60 or higher is considered to be protective against heart disease.  Triglycerides below 150. How can I lower my cholesterol? Diet Follow your diet program as  told by your health care provider.  Choose fish or white meat chicken and [place], roasted or baked. Limit fatty cuts of red meat, fried foods, and processed meats, such as sausage and lunch meats.  Eat lots of fresh fruits and vegetables.  Choose whole grains, beans, pasta, potatoes, and cereals.  Choose olive oil, corn oil, or canola oil, and use only small amounts.  Avoid butter, mayonnaise, shortening, or palm kernel oils.  Avoid foods with trans fats.  Drink skim or nonfat milk and eat low-fat or nonfat yogurt and cheeses. Avoid whole milk, cream, ice cream, egg yolks, and full-fat cheeses.  Healthier desserts include angel food cake, ginger snaps, animal crackers, hard candy, popsicles, and low-fat or nonfat frozen yogurt. Avoid pastries, cakes, pies, and cookies.  Exercise  Follow your exercise program as told by your health care provider. A regular program: ? Helps to decrease LDL and raise HDL. ? Helps with weight control.  Do things that increase your activity level, such as gardening, walking, and taking the stairs.  Ask your health care provider about ways that you can be more active in your daily life.  Medicine  Take over-the-counter and prescription medicines only as told by your health care provider. ? Medicine may be prescribed by your health care provider to help lower cholesterol and decrease the risk for heart disease. This is usually done if diet and exercise have failed to bring down cholesterol levels. ? If you have several risk factors,  you may need medicine even if your levels are normal.  This information is not intended to replace advice given to you by your health care provider. Make sure you discuss any questions you have with your health care provider. Document Released: 03/16/2001 Document Revised: 01/17/2016 Document Reviewed: 12/20/2015 Elsevier Interactive Patient Education  [employer]2018 Elsevier Inc.

## 2017-12-26 NOTE — Assessment & Plan Note (Signed)
New diagnosis for patient.  ASCVD risk score of 10.7%.  Lipitor 40 mg nightly prescribed.  We will plan to recheck direct LDL in 3 months.

## 2017-12-28 ENCOUNTER — Ambulatory Visit: Payer: Medicare HMO | Admitting: *Deleted

## 2018-01-10 ENCOUNTER — Ambulatory Visit (INDEPENDENT_AMBULATORY_CARE_PROVIDER_SITE_OTHER): Payer: Medicare HMO | Admitting: Family Medicine

## 2018-01-10 ENCOUNTER — Encounter: Payer: Self-pay | Admitting: Family Medicine

## 2018-01-10 VITALS — BP 138/79 | HR 91 | Temp 97.6°F | Ht 65.0 in | Wt 154.0 lb

## 2018-01-10 DIAGNOSIS — H1131 Conjunctival hemorrhage, right eye: Secondary | ICD-10-CM

## 2018-01-10 NOTE — Patient Instructions (Signed)

## 2018-01-10 NOTE — Progress Notes (Signed)
Subjective: CC: eye issue PCP: Misty Ip, DO Misty Clark is a 67 y.o. female presenting to clinic today for:  1. Eye problem Patient reports that she developed a red eye that has progressively gotten worse over the last couple of days.  She notes associated itching today.  She denies any visual disturbance, including loss of vision, blurry vision or double vision.  Denies any trauma to the eye.  No coughing but she has been sneezing quite a bit.  Blood pressures been under control with her current regimen.  No use of anticoagulants.   ROS: Per HPI  Allergies  Allergen Reactions  . Codeine     REACTION: vomiting  . Sulfonamide Derivatives     REACTION: rash   Past Medical History:  Diagnosis Date  . Allergy   . Anxiety   . Arthritis   . Hypertension   . IBS (irritable bowel syndrome)    Mixed  . Multiple allergies     Current Outpatient Medications:  .  ALPRAZolam (XANAX) 0.5 MG tablet, Take 0.5-1 tablets (0.25-0.5 mg total) by mouth 2 (two) times daily as needed for anxiety., Disp: 14 tablet, Rfl: 0 .  atorvastatin (LIPITOR) 40 MG tablet, Take 1 tablet (40 mg total) by mouth daily., Disp: 90 tablet, Rfl: 3 .  citalopram (CELEXA) 20 MG tablet, Take 1 tablet (20 mg total) by mouth daily., Disp: 90 tablet, Rfl: 1 .  ipratropium (ATROVENT) 0.06 % nasal spray, 2 sprays by Each Nare route Three (3) times a day., Disp: , Rfl:  .  loratadine (CLARITIN) 10 MG tablet, Take 1 tablet (10 mg total) by mouth daily., Disp: 30 tablet, Rfl: 11 .  triamterene-hydrochlorothiazide (MAXZIDE-25) 37.5-25 MG tablet, Take 1 tablet by mouth daily., Disp: 90 tablet, Rfl: 1 Social History   Socioeconomic History  . Marital status: Widowed    Spouse name: Not on file  . Number of children: Not on file  . Years of education: Not on file  . Highest education level: Not on file  Occupational History  . Occupation: Retired Nursing    Comment: Engineer, [employer]  Social Needs  .  Financial resource strain: Not on file  . Food insecurity:    Worry: Not on file    Inability: Not on file  . Transportation needs:    Medical: Not on file    Non-medical: Not on file  Tobacco Use  . Smoking status: Former Smoker    Packs/day: 0.50  . Smokeless tobacco: Never Used  . Tobacco comment: Quit many years ago.  Substance and Sexual Activity  . Alcohol use: No  . Drug use: Never  . Sexual activity: Not on file  Lifestyle  . Physical activity:    Days per week: Not on file    Minutes per session: Not on file  . Stress: Not on file  Relationships  . Social connections:    Talks on phone: Not on file    Gets together: Not on file    Attends religious service: Not on file    Active member of club or organization: Not on file    Attends meetings of clubs or organizations: Not on file    Relationship status: Not on file  . Intimate partner violence:    Fear of current or ex partner: Not on file    Emotionally abused: Not on file    Physically abused: Not on file    Forced sexual activity: Not on file  Other Topics Concern  . Not on file  Social History Narrative  . Not on file   Family History  Problem Relation Age of Onset  . Heart failure Maternal Grandmother        CHF  . Breast cancer Sister   . Heart attack Paternal Grandfather        MI  . Coronary artery disease Paternal Grandfather        At a premature age  . Coronary artery disease Other   . Colon cancer Other   . Prostate cancer Other   . Ovarian cancer Other   . Asthma Mother   . Thyroid disease Mother   . Heart disease Father   . Hypertension Daughter     Objective: Office vital signs reviewed. BP (!) 154/85   Pulse 91   Temp 97.6 F (36.4 C) (Oral)   Ht 5\' 5"  (1.651 m)   Wt 154 lb (69.9 kg)   BMI 25.63 kg/m   Physical Examination:  General: Awake, alert, well nourished, No acute distress    Eyes: PERRLA, extraocular membranes intact, no pain with extraocular movement testing.   Sclera white on left. On right a subconjunctival hemorrhage is appreciated along the ouer aspect of the sclera.  Assessment/ Plan: 67 y.o. female   1. Conjunctival hemorrhage, right Clinically appears to be a conjunctival hemorrhage.  Likely benign but given worsening of the hemorrhage, I have discussed her care with Dr. Conley RollsLe who agrees to see her today.  Patient will go directly to their office.   Misty IpAshly M Gottschalk, DO [facility] [phone]

## 2018-01-13 NOTE — Telephone Encounter (Signed)
Opened in error

## 2018-02-06 ENCOUNTER — Other Ambulatory Visit (HOSPITAL_COMMUNITY): Payer: Self-pay | Admitting: Internal Medicine

## 2018-02-06 DIAGNOSIS — N63 Unspecified lump in unspecified breast: Secondary | ICD-10-CM

## 2018-03-07 ENCOUNTER — Other Ambulatory Visit: Payer: Self-pay | Admitting: Family Medicine

## 2018-03-07 DIAGNOSIS — N6310 Unspecified lump in the right breast, unspecified quadrant: Secondary | ICD-10-CM

## 2018-03-29 ENCOUNTER — Ambulatory Visit (INDEPENDENT_AMBULATORY_CARE_PROVIDER_SITE_OTHER): Payer: Medicare HMO | Admitting: Family Medicine

## 2018-03-29 ENCOUNTER — Encounter: Payer: Self-pay | Admitting: Family Medicine

## 2018-03-29 VITALS — BP 158/98 | HR 81 | Temp 97.1°F | Ht 65.0 in | Wt 159.0 lb

## 2018-03-29 DIAGNOSIS — M545 Low back pain: Secondary | ICD-10-CM

## 2018-03-29 DIAGNOSIS — R69 Illness, unspecified: Secondary | ICD-10-CM | POA: Diagnosis not present

## 2018-03-29 DIAGNOSIS — F411 Generalized anxiety disorder: Secondary | ICD-10-CM | POA: Diagnosis not present

## 2018-03-29 DIAGNOSIS — J301 Allergic rhinitis due to pollen: Secondary | ICD-10-CM | POA: Diagnosis not present

## 2018-03-29 DIAGNOSIS — I10 Essential (primary) hypertension: Secondary | ICD-10-CM

## 2018-03-29 DIAGNOSIS — Z23 Encounter for immunization: Secondary | ICD-10-CM

## 2018-03-29 MED ORDER — TIZANIDINE HCL 4 MG PO TABS: 2.0000 mg | ORAL_TABLET | Freq: Three times a day (TID) | ORAL | 0 refills | Status: DC | PRN

## 2018-03-29 NOTE — Patient Instructions (Addendum)
Your blood pressure is too high.  Start taking your blood pressure medication again.  Keep an eye on the blood pressure and if you are having blood pressures persistently over 140/90 or if you have blood pressures less than 110/60s, I want you to contact the office.   Back Pain, Adult Back pain is very common. The pain often gets better over time. The cause of back pain is usually not dangerous. Most people can learn to manage their back pain on their own. Follow these instructions at home: Watch your back pain for any changes. The following actions may help to lessen any pain you are feeling:  Stay active. Start with short walks on flat ground if you can. Try to walk farther each day.  Exercise regularly as told by your doctor. Exercise helps your back heal faster. It also helps avoid future injury by keeping your muscles strong and flexible.  Do not sit, drive, or stand in one place for more than 30 minutes.  Do not stay in bed. Resting more than 1-2 days can slow down your recovery.  Be careful when you bend or lift an object. Use good form when lifting: ? Bend at your knees. ? Keep the object close to your body. ? Do not twist.  Sleep on a firm mattress. Lie on your side, and bend your knees. If you lie on your back, put a pillow under your knees.  Take medicines only as told by your doctor.  Put ice on the injured area. ? Put ice in a plastic bag. ? Place a towel between your skin and the bag. ? Leave the ice on for 20 minutes, 2-3 times a day for the first 2-3 days. After that, you can switch between ice and heat packs.  Avoid feeling anxious or stressed. Find good ways to deal with stress, such as exercise.  Maintain a healthy weight. Extra weight puts stress on your back.  Contact a doctor if:  You have pain that does not go away with rest or medicine.  You have worsening pain that goes down into your legs or buttocks.  You have pain that does not get better in one  week.  You have pain at night.  You lose weight.  You have a fever or chills. Get help right away if:  You cannot control when you poop (bowel movement) or pee (urinate).  Your arms or legs feel weak.  Your arms or legs lose feeling (numbness).  You feel sick to your stomach (nauseous) or throw up (vomit).  You have belly (abdominal) pain.  You feel like you may pass out (faint). This information is not intended to replace advice given to you by your health care provider. Make sure you discuss any questions you have with your health care provider. Document Released: 12/08/2007 Document Revised: 11/27/2015 Document Reviewed: 10/23/2013 Elsevier Interactive Patient Education  [employer].

## 2018-03-29 NOTE — Progress Notes (Signed)
Subjective: CC: HTN, HLD, GAD PCP: Raliegh Ip, DO ZOX:WRUEA L Westendorf is a 67 y.o. female presenting to clinic today for:  1. Hypertension w/ Hyperlipidemia ASCVD risk score of 10.7%.  Patient reports Blood pressure at home: Running 120s / 60s to 70s; she discontinued the use of her triamterene hydrochlorothiazide because blood pressures are running well.  Denies headache, dizziness, visual changes, nausea, vomiting, chest pain or shortness of breath.  2. GAD Patient is treated with Celexa 20 mg daily.  She also has as needed Xanax, which she rarely uses.  She has not had to use any of the Xanax in several months.  She states she has had some increased stress related to family members being ill, she cites that her father recently developed atrial fibrillation and was hospitalized.  She has been traveling quite a bit.  3.  Low back pain Patient reports acute onset of low back pain predominantly on the right side radiating to the right hip.  She denies any preceding trauma but states that she has been walking and flying quite a bit.  She describes it as a dull ache that occasionally turns into a burning pain that seems to be worsened by bending.  The pain occurs spontaneously.  She denies any lower extremity weakness, numbness or tingling.  No difficulty with bowel movements, urination or saddle anesthesia.  She has been using Tylenol with minimal improvement in pain.  ROS: Per HPI  Allergies  Allergen Reactions  . Codeine     REACTION: vomiting  . Sulfonamide Derivatives     REACTION: rash   Past Medical History:  Diagnosis Date  . Allergy   . Anxiety   . Arthritis   . Hypertension   . IBS (irritable bowel syndrome)    Mixed  . Multiple allergies     Current Outpatient Medications:  .  ALPRAZolam (XANAX) 0.5 MG tablet, Take 0.5-1 tablets (0.25-0.5 mg total) by mouth 2 (two) times daily as needed for anxiety., Disp: 14 tablet, Rfl: 0 .  atorvastatin (LIPITOR) 40 MG tablet,  Take 1 tablet (40 mg total) by mouth daily., Disp: 90 tablet, Rfl: 3 .  citalopram (CELEXA) 20 MG tablet, Take 1 tablet (20 mg total) by mouth daily., Disp: 90 tablet, Rfl: 1 .  ipratropium (ATROVENT) 0.06 % nasal spray, 2 sprays by Each Nare route Three (3) times a day., Disp: , Rfl:  .  loratadine (CLARITIN) 10 MG tablet, Take 1 tablet (10 mg total) by mouth daily., Disp: 30 tablet, Rfl: 11 .  triamterene-hydrochlorothiazide (MAXZIDE-25) 37.5-25 MG tablet, Take 1 tablet by mouth daily. (Patient not taking: Reported on 03/29/2018), Disp: 90 tablet, Rfl: 1 Social History   Socioeconomic History  . Marital status: Widowed    Spouse name: Not on file  . Number of children: Not on file  . Years of education: Not on file  . Highest education level: Not on file  Occupational History  . Occupation: Retired Nursing    Comment: Engineer, [employer]  Social Needs  . Financial resource strain: Not on file  . Food insecurity:    Worry: Not on file    Inability: Not on file  . Transportation needs:    Medical: Not on file    Non-medical: Not on file  Tobacco Use  . Smoking status: Former Smoker    Packs/day: 0.50  . Smokeless tobacco: Never Used  . Tobacco comment: Quit many years ago.  Substance and Sexual Activity  . Alcohol  use: No  . Drug use: Never  . Sexual activity: Not on file  Lifestyle  . Physical activity:    Days per week: Not on file    Minutes per session: Not on file  . Stress: Not on file  Relationships  . Social connections:    Talks on phone: Not on file    Gets together: Not on file    Attends religious service: Not on file    Active member of club or organization: Not on file    Attends meetings of clubs or organizations: Not on file    Relationship status: Not on file  . Intimate partner violence:    Fear of current or ex partner: Not on file    Emotionally abused: Not on file    Physically abused: Not on file    Forced sexual activity: Not on file  Other  Topics Concern  . Not on file  Social History Narrative  . Not on file   Family History  Problem Relation Age of Onset  . Heart failure Maternal Grandmother        CHF  . Breast cancer Sister   . Heart attack Paternal Grandfather        MI  . Coronary artery disease Paternal Grandfather        At a premature age  . Coronary artery disease Other   . Colon cancer Other   . Prostate cancer Other   . Ovarian cancer Other   . Asthma Mother   . Thyroid disease Mother   . Heart disease Father   . Hypertension Daughter     Objective: Office vital signs reviewed. BP (!) 158/98   Pulse 81   Temp (!) 97.1 F (36.2 C) (Oral)   Ht 5\' 5"  (1.651 m)   Wt 159 lb (72.1 kg)   BMI 26.46 kg/m   Physical Examination:  General: Awake, alert, well nourished, No acute distress HEENT: sclera white, MMM Cardio: regular rate and rhythm, S1S2 heard, no murmurs appreciated Pulm: clear to auscultation bilaterally, no wheezes, rhonchi or rales; normal work of breathing on room air Extremities: warm, well perfused, No edema, cyanosis or clubbing; +2 pulses bilaterally MSK: normal gait and station  Lumbar spine: She has decreased to range of motion in extension and rotation to the right.  No midline tenderness to palpation.  She does have paraspinal muscle tenderness to palpation particularly on the right side over the lumbosacral junction and over the SI joint. Skin: dry; intact; no rashes or lesions Neuro: 5/5 LE Strength and light touch sensation grossly intact, patellar DTRs 2/4 bilaterally  Depression screen [facility] 2/9 03/29/2018 01/10/2018 12/26/2017  Decreased Interest 0 0 0  Down, Depressed, Hopeless 0 0 -  PHQ - 2 Score 0 0 0   GAD 7 : Generalized Anxiety Score 03/29/2018 12/26/2017 11/18/2017  Nervous, Anxious, on Edge 1 0 1  Control/stop worrying 1 0 0  Worry too much - different things 0 0 1  Trouble relaxing 0 0 0  Restless 0 0 0  Easily annoyed or irritable 0 0 0  Afraid - awful might  happen 1 0 0  Total GAD 7 Score 3 0 2  Anxiety Difficulty Not difficult at all Not difficult at all Not difficult at all    Assessment/ Plan: 67 y.o. female   1. Essential hypertension, benign Not at goal but patient has been off of medication.  She will resume use of triamterene hydrochlorothiazide and follow-up in  3 months for recheck of blood pressure.  She will continue to monitor blood pressures at home as well.  Discussed goals and this is reinforced on AVS.  2. Seasonal allergic rhinitis due to pollen Having some nasal drainage since travel to [place].  This was mentioned briefly at the end of the visit.  Should she develop any signs or symptoms of infection, she will contact the office.  3. Generalized anxiety disorder Well-controlled on current regimen.  Rare use of Xanax.  Okay to continue Celexa 20 mg of as needed Xanax.  She will follow-up PRN.  4. Need for influenza vaccination Influenza vaccine administered today.  5. Acute right-sided low back pain without sciatica Likely muscle spasm.  She does have some tightness, particularly with extension rotation to the right of the spinal exam.  She otherwise had no focal neurologic deficits or muscular skeletal deficits.  I have given her a small quantity of muscle relaxer to use at bedtime if needed.  She will follow-up with me in the next couple of weeks if symptoms are worsening or not improving.  At that time, can consider obtaining lumbar spine x-rays.   No orders of the defined types were placed in this encounter.  Meds ordered this encounter  Medications  . tiZANidine (ZANAFLEX) 4 MG tablet    Sig: Take 0.5-1 tablets (2-4 mg total) by mouth every 8 (eight) hours as needed for muscle spasms.    Dispense:  30 tablet    Refill:  0     Ashly Hulen Skains, DO [facility] [phone]

## 2018-03-31 ENCOUNTER — Other Ambulatory Visit: Payer: Self-pay | Admitting: Family Medicine

## 2018-03-31 MED ORDER — IPRATROPIUM BROMIDE 0.06 % NA SOLN: NASAL | 0 refills | Status: DC

## 2018-03-31 NOTE — Telephone Encounter (Signed)
What is the name of the medication? Ipratropium 0.06% Nasal Spray Was seen 9-25 with Dr. Nadine Counts and she was going to call it in for her.  Have you contacted your pharmacy to request a refill? Yes  Which pharmacy would you like this sent to? [facility] in [place]   Patient notified that their request is being sent to the clinical staff for review and that they should receive a call once it is complete. If they do not receive a call within 24 hours they can check with their pharmacy or our office.

## 2018-04-11 ENCOUNTER — Ambulatory Visit (HOSPITAL_COMMUNITY)
Admission: RE | Admit: 2018-04-11 | Discharge: 2018-04-11 | Disposition: A | Discharge status: Home / Self Care | Payer: Medicare HMO | Source: Ambulatory Visit | Attending: Family Medicine | Admitting: Family Medicine

## 2018-04-11 DIAGNOSIS — N6310 Unspecified lump in the right breast, unspecified quadrant: Secondary | ICD-10-CM

## 2018-04-11 DIAGNOSIS — N6489 Other specified disorders of breast: Secondary | ICD-10-CM | POA: Diagnosis not present

## 2018-04-11 DIAGNOSIS — R928 Other abnormal and inconclusive findings on diagnostic imaging of breast: Secondary | ICD-10-CM | POA: Diagnosis not present

## 2018-04-25 ENCOUNTER — Ambulatory Visit: Payer: Medicare HMO | Admitting: Family Medicine

## 2018-04-26 ENCOUNTER — Ambulatory Visit (INDEPENDENT_AMBULATORY_CARE_PROVIDER_SITE_OTHER): Payer: Medicare HMO

## 2018-04-26 ENCOUNTER — Ambulatory Visit (INDEPENDENT_AMBULATORY_CARE_PROVIDER_SITE_OTHER): Payer: Medicare HMO | Admitting: Family Medicine

## 2018-04-26 ENCOUNTER — Encounter: Payer: Self-pay | Admitting: Family Medicine

## 2018-04-26 VITALS — BP 139/78 | HR 84 | Temp 97.8°F | Ht 65.0 in | Wt 160.0 lb

## 2018-04-26 DIAGNOSIS — R05 Cough: Secondary | ICD-10-CM

## 2018-04-26 DIAGNOSIS — R399 Unspecified symptoms and signs involving the genitourinary system: Secondary | ICD-10-CM | POA: Diagnosis not present

## 2018-04-26 DIAGNOSIS — R053 Chronic cough: Secondary | ICD-10-CM

## 2018-04-26 LAB — URINALYSIS
BILIRUBIN UA: NEGATIVE
GLUCOSE, UA: NEGATIVE
Ketones, UA: NEGATIVE
Nitrite, UA: NEGATIVE
PROTEIN UA: NEGATIVE
SPEC GRAV UA: 1.015 (ref 1.005–1.030)
Urobilinogen, Ur: 0.2 mg/dL (ref 0.2–1.0)
pH, UA: 7 (ref 5.0–7.5)

## 2018-04-26 MED ORDER — BENZONATATE 100 MG PO CAPS: 100.0000 mg | ORAL_CAPSULE | Freq: Three times a day (TID) | ORAL | 0 refills | Status: DC | PRN

## 2018-04-26 MED ORDER — ALBUTEROL SULFATE HFA 108 (90 BASE) MCG/ACT IN AERS: 2.0000 | INHALATION_SPRAY | Freq: Four times a day (QID) | RESPIRATORY_TRACT | 0 refills | Status: DC | PRN

## 2018-04-26 MED ORDER — AZITHROMYCIN 250 MG PO TABS: ORAL_TABLET | ORAL | 0 refills | Status: DC

## 2018-04-26 NOTE — Patient Instructions (Signed)
I have prescribed you azithromycin to cover for possible pneumonia.  This should treat any sinus symptoms as well.  I have also sent in cough Perles to use up to 3 times daily if needed.  Your albuterol inhaler has been refilled.  If your symptoms worsen or do not improve, I want you to return for reevaluation.  Acute Bronchitis, Adult Acute bronchitis is sudden (acute) swelling of the air tubes (bronchi) in the lungs. Acute bronchitis causes these tubes to fill with mucus, which can make it hard to breathe. It can also cause coughing or wheezing. In adults, acute bronchitis usually goes away within 2 weeks. A cough caused by bronchitis may last up to 3 weeks. Smoking, allergies, and asthma can make the condition worse. Repeated episodes of bronchitis may cause further lung problems, such as chronic obstructive pulmonary disease (COPD). What are the causes? This condition can be caused by germs and by substances that irritate the lungs, including:  Cold and flu viruses. This condition is most often caused by the same virus that causes a cold.  Bacteria.  Exposure to tobacco smoke, dust, fumes, and air pollution.  What increases the risk? This condition is more likely to develop in people who:  Have close contact with someone with acute bronchitis.  Are exposed to lung irritants, such as tobacco smoke, dust, fumes, and vapors.  Have a weak immune system.  Have a respiratory condition such as asthma.  What are the signs or symptoms? Symptoms of this condition include:  A cough.  Coughing up clear, yellow, or green mucus.  Wheezing.  Chest congestion.  Shortness of breath.  A fever.  Body aches.  Chills.  A sore throat.  How is this diagnosed? This condition is usually diagnosed with a physical exam. During the exam, your health care provider may order tests, such as chest X-rays, to rule out other conditions. He or she may also:  Test a sample of your mucus for  bacterial infection.  Check the level of oxygen in your blood. This is done to check for pneumonia.  Do a chest X-ray or lung function testing to rule out pneumonia and other conditions.  Perform blood tests.  Your health care provider will also ask about your symptoms and medical history. How is this treated? Most cases of acute bronchitis clear up over time without treatment. Your health care provider may recommend:  Drinking more fluids. Drinking more makes your mucus thinner, which may make it easier to breathe.  Taking a medicine for a fever or cough.  Taking an antibiotic medicine.  Using an inhaler to help improve shortness of breath and to control a cough.  Using a cool mist vaporizer or humidifier to make it easier to breathe.  Follow these instructions at home: Medicines  Take over-the-counter and prescription medicines only as told by your health care provider.  If you were prescribed an antibiotic, take it as told by your health care provider. Do not stop taking the antibiotic even if you start to feel better. General instructions  Get plenty of rest.  Drink enough fluids to keep your urine clear or pale yellow.  Avoid smoking and secondhand smoke. Exposure to cigarette smoke or irritating chemicals will make bronchitis worse. If you smoke and you need help quitting, ask your health care provider. Quitting smoking will help your lungs heal faster.  Use an inhaler, cool mist vaporizer, or humidifier as told by your health care provider.  Keep all follow-up visits  as told by your health care provider. This is important. How is this prevented? To lower your risk of getting this condition again:  Wash your hands often with soap and water. If soap and water are not available, use hand sanitizer.  Avoid contact with people who have cold symptoms.  Try not to touch your hands to your mouth, nose, or eyes.  Make sure to get the flu shot every year.  Contact a  health care provider if:  Your symptoms do not improve in 2 weeks of treatment. Get help right away if:  You cough up blood.  You have chest pain.  You have severe shortness of breath.  You become dehydrated.  You faint or keep feeling like you are going to faint.  You keep vomiting.  You have a severe headache.  Your fever or chills gets worse. This information is not intended to replace advice given to you by your health care provider. Make sure you discuss any questions you have with your health care provider. Document Released: 07/29/2004 Document Revised: 01/14/2016 Document Reviewed: 12/10/2015 Elsevier Interactive Patient Education  [employer].

## 2018-04-26 NOTE — Progress Notes (Signed)
Subjective: CC: Cough PCP: Raliegh Ip, DO ZHY:QMVHQ Misty Clark is a 67 y.o. female presenting to clinic today for:  1. Cough Patient reports a 3-week history of productive cough, which started out as sinus stuffiness and drainage.  She took Tylenol Sinus, use her Atrovent nasal spray and Zyrtec with little improvement in symptoms.  She notes that the cough seems worse, particularly with laying down.  She feels that she has been having subjective fevers and chills.  She reports rib pain because of persistent coughing.  She has some shortness of breath after coughing spells and feels like she is having some shortness of breath with exertion.  She used albuterol, which she had left over from a previous illness, which seemed to help.  Denies any hemoptysis.   ROS: Per HPI  Allergies  Allergen Reactions  . Codeine     REACTION: vomiting  . Sulfonamide Derivatives     REACTION: rash   Past Medical History:  Diagnosis Date  . Allergy   . Anxiety   . Arthritis   . Hypertension   . IBS (irritable bowel syndrome)    Mixed  . Multiple allergies     Current Outpatient Medications:  .  ALPRAZolam (XANAX) 0.5 MG tablet, Take 0.5-1 tablets (0.25-0.5 mg total) by mouth 2 (two) times daily as needed for anxiety., Disp: 14 tablet, Rfl: 0 .  atorvastatin (LIPITOR) 40 MG tablet, Take 1 tablet (40 mg total) by mouth daily., Disp: 90 tablet, Rfl: 3 .  citalopram (CELEXA) 20 MG tablet, Take 1 tablet (20 mg total) by mouth daily., Disp: 90 tablet, Rfl: 1 .  ipratropium (ATROVENT) 0.06 % nasal spray, 2 sprays by Each Nare route Three (3) times a day., Disp: 45 mL, Rfl: 0 .  loratadine (CLARITIN) 10 MG tablet, Take 1 tablet (10 mg total) by mouth daily., Disp: 30 tablet, Rfl: 11 .  tiZANidine (ZANAFLEX) 4 MG tablet, Take 0.5-1 tablets (2-4 mg total) by mouth every 8 (eight) hours as needed for muscle spasms., Disp: 30 tablet, Rfl: 0 .  triamterene-hydrochlorothiazide (MAXZIDE-25) 37.5-25 MG  tablet, Take 1 tablet by mouth daily. (Patient not taking: Reported on 03/29/2018), Disp: 90 tablet, Rfl: 1 Social History   Socioeconomic History  . Marital status: Widowed    Spouse name: Not on file  . Number of children: Not on file  . Years of education: Not on file  . Highest education level: Not on file  Occupational History  . Occupation: Retired Nursing    Comment: Engineer, [employer]  Social Needs  . Financial resource strain: Not on file  . Food insecurity:    Worry: Not on file    Inability: Not on file  . Transportation needs:    Medical: Not on file    Non-medical: Not on file  Tobacco Use  . Smoking status: Former Smoker    Packs/day: 0.50  . Smokeless tobacco: Never Used  . Tobacco comment: Quit many years ago.  Substance and Sexual Activity  . Alcohol use: No  . Drug use: Never  . Sexual activity: Not on file  Lifestyle  . Physical activity:    Days per week: Not on file    Minutes per session: Not on file  . Stress: Not on file  Relationships  . Social connections:    Talks on phone: Not on file    Gets together: Not on file    Attends religious service: Not on file    Active member  of club or organization: Not on file    Attends meetings of clubs or organizations: Not on file    Relationship status: Not on file  . Intimate partner violence:    Fear of current or ex partner: Not on file    Emotionally abused: Not on file    Physically abused: Not on file    Forced sexual activity: Not on file  Other Topics Concern  . Not on file  Social History Narrative  . Not on file   Family History  Problem Relation Age of Onset  . Heart failure Maternal Grandmother        CHF  . Breast cancer Sister   . Heart attack Paternal Grandfather        MI  . Coronary artery disease Paternal Grandfather        At a premature age  . Coronary artery disease Other   . Colon cancer Other   . Prostate cancer Other   . Ovarian cancer Other   . Asthma Mother   .  Thyroid disease Mother   . Heart disease Father   . Hypertension Daughter     Objective: Office vital signs reviewed. BP 139/78   Pulse 84   Temp 97.8 F (36.6 C) (Oral)   Ht 5\' 5"  (1.651 m)   Wt 160 lb (72.6 kg)   SpO2 97%   BMI 26.63 kg/m   Physical Examination:  General: Awake, alert, well nourished, nontoxic. No acute distress HEENT: Normal    Neck: No masses palpated. No lymphadenopathy    Ears: Tympanic membranes intact, normal light reflex, no erythema, no bulging    Eyes: PERRLA, extraocular membranes intact, sclera white    Nose: nasal turbinates moist, clear nasal discharge    Throat: moist mucus membranes, no erythema, no tonsillar exudate.  Airway is patent Cardio: regular rate and rhythm, S1S2 heard, no murmurs appreciated Pulm: Slight decreased breath sounds and appreciable mild expiratory wheezes in the right middle lung fields.  Otherwise clear to auscultation.  No rhonchi or rales; normal work of breathing on room air  No results found.  Assessment/ Plan: 67 y.o. female   1. Persistent cough for 3 weeks or longer Her work of breathing on room air is within normal limits.  Her physical exam was remarkable for what sounded like mild expiratory wheezes in the right middle lung field with slight decreased breath sounds in that area.  I ordered a chest x-ray which upon personal review does report feel some streakiness in that area.  Awaiting formal review by radiology.  I have started empiric antibiotics with a Z-Pak.  Instructions for use discussed with the patient.  Tessalon Perles sent for cough.  Albuterol inhaler refilled.  Patient to follow-up if symptoms are not improving or are worsening.  Reasons for emergent evaluation the emergency department discussed as well.  Follow-up as needed as directed. - DG Chest 2 View; Future   Orders Placed This Encounter  Procedures  . DG Chest 2 View    Standing Status:   Future    Number of Occurrences:   1    Standing  Expiration Date:   06/27/2019    Order Specific Question:   Reason for Exam (SYMPTOM  OR DIAGNOSIS REQUIRED)    Answer:   3 week productive cough.  RML w/ wheeze and decreased breath sounds.    Order Specific Question:   Preferred imaging location?    Answer:   Internal  Order Specific Question:   Radiology Contrast Protocol - do NOT remove file path    Answer:   [email or web address]   Meds ordered this encounter  Medications  . benzonatate (TESSALON PERLES) 100 MG capsule    Sig: Take 1 capsule (100 mg total) by mouth 3 (three) times daily as needed.    Dispense:  20 capsule    Refill:  0  . albuterol (PROVENTIL HFA;VENTOLIN HFA) 108 (90 Base) MCG/ACT inhaler    Sig: Inhale 2 puffs into the lungs every 6 (six) hours as needed for wheezing or shortness of breath.    Dispense:  1 Inhaler    Refill:  0  . azithromycin (ZITHROMAX) 250 MG tablet    Sig: Take 2 tablets today, then take 1 tablet daily until gone.    Dispense:  6 tablet    Refill:  0     Danniell Rotundo Hulen Skains, DO [facility] [phone]

## 2018-04-28 ENCOUNTER — Ambulatory Visit: Payer: Medicare HMO | Admitting: Family Medicine

## 2018-05-10 ENCOUNTER — Ambulatory Visit (INDEPENDENT_AMBULATORY_CARE_PROVIDER_SITE_OTHER): Payer: Medicare HMO | Admitting: Family Medicine

## 2018-05-10 ENCOUNTER — Encounter: Payer: Self-pay | Admitting: Family Medicine

## 2018-05-10 VITALS — BP 121/73 | HR 86 | Temp 97.4°F | Ht 65.0 in | Wt 160.0 lb

## 2018-05-10 DIAGNOSIS — R053 Chronic cough: Secondary | ICD-10-CM

## 2018-05-10 DIAGNOSIS — R05 Cough: Secondary | ICD-10-CM

## 2018-05-10 DIAGNOSIS — E782 Mixed hyperlipidemia: Secondary | ICD-10-CM

## 2018-05-10 MED ORDER — MONTELUKAST SODIUM 10 MG PO TABS: 10.0000 mg | ORAL_TABLET | Freq: Every day | ORAL | 1 refills | Status: DC

## 2018-05-10 NOTE — Progress Notes (Signed)
Subjective: CC: cough PCP: Raliegh Ip, DO ZOX:WRUEA Misty Clark is a 67 y.o. female presenting to clinic today for:  1. Cough Patient is here for 2-week recheck on her cough.  She notes she continues to have sinus drainage and that the cough seems to be worse in the morning.  She has had some thickened phlegm.  She alternates Claritin and Zyrtec daily.  She also uses Atrovent nasal spray.  She is planning on adding humidification today.  Denies any hemoptysis, shortness of breath or wheeze.  Upon further questioning, she was actually on Singulair many years ago and did well on this but somehow it was discontinued when she switched from her old PCP to me.  She would like to go back on this as it works well for a family member.   ROS: Per HPI  Allergies  Allergen Reactions  . Codeine     REACTION: vomiting  . Sulfonamide Derivatives     REACTION: rash   Past Medical History:  Diagnosis Date  . Allergy   . Anxiety   . Arthritis   . Hypertension   . IBS (irritable bowel syndrome)    Mixed  . Multiple allergies     Current Outpatient Medications:  .  albuterol (PROVENTIL HFA;VENTOLIN HFA) 108 (90 Base) MCG/ACT inhaler, Inhale 2 puffs into the lungs every 6 (six) hours as needed for wheezing or shortness of breath., Disp: 1 Inhaler, Rfl: 0 .  ALPRAZolam (XANAX) 0.5 MG tablet, Take 0.5-1 tablets (0.25-0.5 mg total) by mouth 2 (two) times daily as needed for anxiety., Disp: 14 tablet, Rfl: 0 .  atorvastatin (LIPITOR) 40 MG tablet, Take 1 tablet (40 mg total) by mouth daily., Disp: 90 tablet, Rfl: 3 .  benzonatate (TESSALON PERLES) 100 MG capsule, Take 1 capsule (100 mg total) by mouth 3 (three) times daily as needed., Disp: 20 capsule, Rfl: 0 .  citalopram (CELEXA) 20 MG tablet, Take 1 tablet (20 mg total) by mouth daily., Disp: 90 tablet, Rfl: 1 .  ipratropium (ATROVENT) 0.06 % nasal spray, 2 sprays by Each Nare route Three (3) times a day., Disp: 45 mL, Rfl: 0 .  loratadine  (CLARITIN) 10 MG tablet, Take 1 tablet (10 mg total) by mouth daily., Disp: 30 tablet, Rfl: 11 .  tiZANidine (ZANAFLEX) 4 MG tablet, Take 0.5-1 tablets (2-4 mg total) by mouth every 8 (eight) hours as needed for muscle spasms., Disp: 30 tablet, Rfl: 0 .  triamterene-hydrochlorothiazide (MAXZIDE-25) 37.5-25 MG tablet, Take 1 tablet by mouth daily., Disp: 90 tablet, Rfl: 1 Social History   Socioeconomic History  . Marital status: Widowed    Spouse name: Not on file  . Number of children: Not on file  . Years of education: Not on file  . Highest education level: Not on file  Occupational History  . Occupation: Retired Nursing    Comment: Engineer, [employer]  Social Needs  . Financial resource strain: Not on file  . Food insecurity:    Worry: Not on file    Inability: Not on file  . Transportation needs:    Medical: Not on file    Non-medical: Not on file  Tobacco Use  . Smoking status: Former Smoker    Packs/day: 0.50  . Smokeless tobacco: Never Used  . Tobacco comment: Quit many years ago.  Substance and Sexual Activity  . Alcohol use: No  . Drug use: Never  . Sexual activity: Not on file  Lifestyle  . Physical activity:  Days per week: Not on file    Minutes per session: Not on file  . Stress: Not on file  Relationships  . Social connections:    Talks on phone: Not on file    Gets together: Not on file    Attends religious service: Not on file    Active member of club or organization: Not on file    Attends meetings of clubs or organizations: Not on file    Relationship status: Not on file  . Intimate partner violence:    Fear of current or ex partner: Not on file    Emotionally abused: Not on file    Physically abused: Not on file    Forced sexual activity: Not on file  Other Topics Concern  . Not on file  Social History Narrative  . Not on file   Family History  Problem Relation Age of Onset  . Heart failure Maternal Grandmother        CHF  . Breast cancer  Sister   . Heart attack Paternal Grandfather        MI  . Coronary artery disease Paternal Grandfather        At a premature age  . Coronary artery disease Other   . Colon cancer Other   . Prostate cancer Other   . Ovarian cancer Other   . Asthma Mother   . Thyroid disease Mother   . Heart disease Father   . Hypertension Daughter     Objective: Office vital signs reviewed. BP 121/73   Pulse 86   Temp (!) 97.4 F (36.3 C) (Oral)   Ht 5\' 5"  (1.651 m)   Wt 160 lb (72.6 kg)   SpO2 97%   BMI 26.63 kg/m   Physical Examination:  General: Awake, alert, well nourished, No acute distress HEENT: Normal    Neck: No masses palpated. No lymphadenopathy    Ears: Tympanic membranes intact, normal light reflex, no erythema, no bulging    Eyes: PERRLA, extraocular membranes intact, sclera white    Nose: nasal turbinates moist, clear nasal discharge    Throat: moist mucus membranes, no erythema, no tonsillar exudate.  Airway is patent Cardio: regular rate and rhythm, S1S2 heard, no murmurs appreciated Pulm: clear to auscultation bilaterally, no wheezes, rhonchi or rales; normal work of breathing on room air  Assessment/ Plan: 67 y.o. female   1. Persistent cough for 3 weeks or longer Likely related to postnasal drip.  I have added Singulair 10 mg nightly.  She is to continue Claritin daily.  Continue nasal spray as directed.  Add humidification.  We discussed addition of nasal saline gel or missed as well.  Reasons for return discussed.  She will follow-up in the next 8 weeks or sooner if needed. - CBC  2. Mixed hyperlipidemia Started on Lipitor this past summer for ASCVD risk score of 10.7%.  We will check direct LDL. - LDL Cholesterol, Direct   Orders Placed This Encounter  Procedures  . LDL Cholesterol, Direct  . CBC   Meds ordered this encounter  Medications  . montelukast (SINGULAIR) 10 MG tablet    Sig: Take 1 tablet (10 mg total) by mouth at bedtime.    Dispense:  90  tablet    Refill:  1     Louay Myrie Hulen Skains, DO [facility] [phone]

## 2018-05-10 NOTE — Patient Instructions (Addendum)
You had labs performed today.  You will be contacted with the results of the labs once they are available, usually in the next 3 business days for routine lab work.   We have added singulair to use at night.  I want you to add humidification to your room and use nasal saline gel or mist if you are having persistent nasal bleeds.  Continue Claritin during the day.

## 2018-05-11 LAB — LDL CHOLESTEROL, DIRECT: LDL Direct: 80 mg/dL (ref 0–99)

## 2018-05-11 LAB — CBC
HEMATOCRIT: 37.9 % (ref 34.0–46.6)
Hemoglobin: 13.3 g/dL (ref 11.1–15.9)
MCH: 30.3 pg (ref 26.6–33.0)
MCHC: 35.1 g/dL (ref 31.5–35.7)
MCV: 86 fL (ref 79–97)
Platelets: 248 10*3/uL (ref 150–450)
RBC: 4.39 x10E6/uL (ref 3.77–5.28)
RDW: 11.4 % — AB (ref 12.3–15.4)
WBC: 5.9 10*3/uL (ref 3.4–10.8)

## 2018-06-29 ENCOUNTER — Ambulatory Visit: Payer: Medicare HMO | Admitting: Family

## 2018-07-03 ENCOUNTER — Ambulatory Visit (INDEPENDENT_AMBULATORY_CARE_PROVIDER_SITE_OTHER): Payer: Medicare HMO | Admitting: Family Medicine

## 2018-07-03 ENCOUNTER — Encounter: Payer: Self-pay | Admitting: Family Medicine

## 2018-07-03 VITALS — BP 107/68 | HR 86 | Temp 97.7°F | Ht 65.0 in | Wt 159.0 lb

## 2018-07-03 DIAGNOSIS — B9689 Other specified bacterial agents as the cause of diseases classified elsewhere: Secondary | ICD-10-CM

## 2018-07-03 DIAGNOSIS — J019 Acute sinusitis, unspecified: Secondary | ICD-10-CM | POA: Diagnosis not present

## 2018-07-03 DIAGNOSIS — J301 Allergic rhinitis due to pollen: Secondary | ICD-10-CM | POA: Diagnosis not present

## 2018-07-03 DIAGNOSIS — F411 Generalized anxiety disorder: Secondary | ICD-10-CM | POA: Diagnosis not present

## 2018-07-03 DIAGNOSIS — R69 Illness, unspecified: Secondary | ICD-10-CM | POA: Diagnosis not present

## 2018-07-03 DIAGNOSIS — I10 Essential (primary) hypertension: Secondary | ICD-10-CM | POA: Diagnosis not present

## 2018-07-03 MED ORDER — AMOXICILLIN-POT CLAVULANATE 875-125 MG PO TABS: 1.0000 | ORAL_TABLET | Freq: Two times a day (BID) | ORAL | 0 refills | Status: DC

## 2018-07-03 MED ORDER — TRIAMTERENE-HCTZ 37.5-25 MG PO TABS: 1.0000 | ORAL_TABLET | Freq: Every day | ORAL | 1 refills | Status: DC

## 2018-07-03 MED ORDER — BENZONATATE 100 MG PO CAPS: 100.0000 mg | ORAL_CAPSULE | Freq: Three times a day (TID) | ORAL | 1 refills | Status: DC | PRN

## 2018-07-03 MED ORDER — CITALOPRAM HYDROBROMIDE 20 MG PO TABS: 20.0000 mg | ORAL_TABLET | Freq: Every day | ORAL | 1 refills | Status: DC

## 2018-07-03 NOTE — Progress Notes (Signed)
Subjective: CC: cough, anxiety, htn PCP: Raliegh IpGottschalk,  M, DO WJX:BJYNWHPI:Misty Clark is a 67 y.o. female presenting to clinic today for:  1. Cough Has been ongoing essentially since October.  She does report that since adding the Singulair the cough that she was experiencing previously has resolved however she started developing more drainage, postnasal drip and a mild cough over the last couple of weeks.  She has been taking her Singulair, Claritin, using her nasal spray but these are not helping.  She did feel that she lost her breath when she went outside in the cold recently.  The seem to be relieved by albuterol.  Denies any hemoptysis, shortness of breath, wheezing.  She reports associated facial pain.  She did have subjective fevers that have resolved.  She is unsure as to who her sick contact was but notes that she had been shopping for Christmas and could have come in contact with anything.  2.  Anxiety disorder Patient reports good control with Celexa.  No SI, HI.  No panic symptoms.  3.  Hypertension Patient reports compliance with Maxide.  No chest pain, visual disturbance.  ROS: Per HPI  Allergies  Allergen Reactions  . Codeine     REACTION: vomiting  . Sulfonamide Derivatives     REACTION: rash   Past Medical History:  Diagnosis Date  . Allergy   . Anxiety   . Arthritis   . Hypertension   . IBS (irritable bowel syndrome)    Mixed  . Multiple allergies     Current Outpatient Medications:  .  albuterol (PROVENTIL HFA;VENTOLIN HFA) 108 (90 Base) MCG/ACT inhaler, Inhale 2 puffs into the lungs every 6 (six) hours as needed for wheezing or shortness of breath., Disp: 1 Inhaler, Rfl: 0 .  ALPRAZolam (XANAX) 0.5 MG tablet, Take 0.5-1 tablets (0.25-0.5 mg total) by mouth 2 (two) times daily as needed for anxiety., Disp: 14 tablet, Rfl: 0 .  atorvastatin (LIPITOR) 40 MG tablet, Take 1 tablet (40 mg total) by mouth daily., Disp: 90 tablet, Rfl: 3 .  benzonatate (TESSALON  PERLES) 100 MG capsule, Take 1 capsule (100 mg total) by mouth 3 (three) times daily as needed., Disp: 20 capsule, Rfl: 0 .  citalopram (CELEXA) 20 MG tablet, Take 1 tablet (20 mg total) by mouth daily., Disp: 90 tablet, Rfl: 1 .  ipratropium (ATROVENT) 0.06 % nasal spray, 2 sprays by Each Nare route Three (3) times a day., Disp: 45 mL, Rfl: 0 .  loratadine (CLARITIN) 10 MG tablet, Take 1 tablet (10 mg total) by mouth daily., Disp: 30 tablet, Rfl: 11 .  montelukast (SINGULAIR) 10 MG tablet, Take 1 tablet (10 mg total) by mouth at bedtime., Disp: 90 tablet, Rfl: 1 .  tiZANidine (ZANAFLEX) 4 MG tablet, Take 0.5-1 tablets (2-4 mg total) by mouth every 8 (eight) hours as needed for muscle spasms., Disp: 30 tablet, Rfl: 0 .  triamterene-hydrochlorothiazide (MAXZIDE-25) 37.5-25 MG tablet, Take 1 tablet by mouth daily., Disp: 90 tablet, Rfl: 1 Social History   Socioeconomic History  . Marital status: Widowed    Spouse name: Not on file  . Number of children: Not on file  . Years of education: Not on file  . Highest education level: Not on file  Occupational History  . Occupation: Retired Nursing    Comment: Engineer, [employer] unit/ HH  Social Needs  . Financial resource strain: Not on file  . Food insecurity:    Worry: Not on file    Inability:  Not on file  . Transportation needs:    Medical: Not on file    Non-medical: Not on file  Tobacco Use  . Smoking status: Former Smoker    Packs/day: 0.50  . Smokeless tobacco: Never Used  . Tobacco comment: Quit many years ago.  Substance and Sexual Activity  . Alcohol use: No  . Drug use: Never  . Sexual activity: Not on file  Lifestyle  . Physical activity:    Days per week: Not on file    Minutes per session: Not on file  . Stress: Not on file  Relationships  . Social connections:    Talks on phone: Not on file    Gets together: Not on file    Attends religious service: Not on file    Active member of club or organization: Not on file     Attends meetings of clubs or organizations: Not on file    Relationship status: Not on file  . Intimate partner violence:    Fear of current or ex partner: Not on file    Emotionally abused: Not on file    Physically abused: Not on file    Forced sexual activity: Not on file  Other Topics Concern  . Not on file  Social History Narrative  . Not on file   Family History  Problem Relation Age of Onset  . Heart failure Maternal Grandmother        CHF  . Breast cancer Sister   . Heart attack Paternal Grandfather        MI  . Coronary artery disease Paternal Grandfather        At a premature age  . Coronary artery disease Other   . Colon cancer Other   . Prostate cancer Other   . Ovarian cancer Other   . Asthma Mother   . Thyroid disease Mother   . Heart disease Father   . Hypertension Daughter     Objective: Office vital signs reviewed. BP 107/68   Pulse 86   Temp 97.7 F (36.5 C) (Oral)   Ht 5\' 5"  (1.651 m)   Wt 159 lb (72.1 kg)   BMI 26.46 kg/m   Physical Examination:  General: Awake, alert, well nourished, No acute distress HEENT: Mild TTP to maxillary sinuses. No TTP to frontal sinuses    Neck: No masses palpated. No lymphadenopathy    Ears: Tympanic membranes intact, normal light reflex, no erythema, no bulging    Eyes: PERRLA, extraocular membranes intact, sclera white    Nose: nasal turbinates moist, scant nasal discharge in left nare.    Throat: moist mucus membranes, no erythema, no tonsillar exudate.  Airway is patent Cardio: regular rate and rhythm, S1S2 heard, no murmurs appreciated Pulm: clear to auscultation bilaterally, no wheezes, rhonchi or rales; normal work of breathing on room air Psych: Mood stable, speech normal, affect appropriate, pleasant and interactive. Depression screen [facility] 2/9 07/03/2018 05/10/2018 04/26/2018  Decreased Interest 0 0 0  Down, Depressed, Hopeless 0 0 0  PHQ - 2 Score 0 0 0  Altered sleeping 0 0 -  Tired, decreased energy  0 0 -  Change in appetite 0 0 -  Feeling bad or failure about yourself  0 0 -  Trouble concentrating 0 0 -  Moving slowly or fidgety/restless 0 0 -  Suicidal thoughts 0 0 -  PHQ-9 Score 0 0 -  Difficult doing work/chores - Not difficult at all -   GAD 7 :  Generalized Anxiety Score 07/03/2018 03/29/2018 12/26/2017 11/18/2017  Nervous, Anxious, on Edge 0 1 0 1  Control/stop worrying 0 1 0 0  Worry too much - different things 0 0 0 1  Trouble relaxing 0 0 0 0  Restless 0 0 0 0  Easily annoyed or irritable 0 0 0 0  Afraid - awful might happen 0 1 0 0  Total GAD 7 Score 0 3 0 2  Anxiety Difficulty - Not difficult at all Not difficult at all Not difficult at all      Assessment/ Plan: 67 y.o. female   1. Acute bacterial sinusitis The chronic cough has resolved with addition of Singulair.  However, I suspect that she has a sinus infection.  Postnasal drip is likely causing a reactive cough.  I question bronchospasm since she had a flare after being exposed to cold air recently.  Augmentin 875 p.o. twice daily prescribed.  Okay to continue Claritin, nasal spray and Singulair.  If symptoms persist, we will plan to place referral to pulmonology.  Patient will follow-up in 1 month.  2. Generalized anxiety disorder Doing well.  Celexa refilled.  3. Seasonal allergic rhinitis due to pollen Continue allergy medicines as above.  4. Essential hypertension, benign Under excellent control.  Continue current regimen.   No orders of the defined types were placed in this encounter.  Meds ordered this encounter  Medications  . amoxicillin-clavulanate (AUGMENTIN) 875-125 MG tablet    Sig: Take 1 tablet by mouth 2 (two) times daily.    Dispense:  20 tablet    Refill:  0  . benzonatate (TESSALON PERLES) 100 MG capsule    Sig: Take 1 capsule (100 mg total) by mouth 3 (three) times daily as needed.    Dispense:  20 capsule    Refill:  1  . citalopram (CELEXA) 20 MG tablet    Sig: Take 1 tablet  (20 mg total) by mouth daily.    Dispense:  90 tablet    Refill:  1  . triamterene-hydrochlorothiazide (MAXZIDE-25) 37.5-25 MG tablet    Sig: Take 1 tablet by mouth daily.    Dispense:  90 tablet    Refill:  1      Hulen Skains, DO [facility] [phone]

## 2018-07-03 NOTE — Patient Instructions (Signed)
Follow up in 1 month.  If cough does not resolve, we will place referral to pulmonology.

## 2018-07-11 ENCOUNTER — Ambulatory Visit: Payer: Medicare HMO | Admitting: Family Medicine

## 2018-07-28 ENCOUNTER — Encounter: Payer: Self-pay | Admitting: Family Medicine

## 2018-07-28 ENCOUNTER — Ambulatory Visit (INDEPENDENT_AMBULATORY_CARE_PROVIDER_SITE_OTHER): Payer: Medicare HMO | Admitting: Family Medicine

## 2018-07-28 ENCOUNTER — Ambulatory Visit (INDEPENDENT_AMBULATORY_CARE_PROVIDER_SITE_OTHER): Payer: Medicare HMO

## 2018-07-28 VITALS — BP 137/88 | HR 108 | Temp 98.7°F | Ht 65.0 in | Wt 161.0 lb

## 2018-07-28 DIAGNOSIS — W19XXXA Unspecified fall, initial encounter: Secondary | ICD-10-CM

## 2018-07-28 DIAGNOSIS — M25531 Pain in right wrist: Secondary | ICD-10-CM

## 2018-07-28 DIAGNOSIS — M25512 Pain in left shoulder: Secondary | ICD-10-CM | POA: Diagnosis not present

## 2018-07-28 MED ORDER — ALPRAZOLAM 0.5 MG PO TABS: 0.2500 mg | ORAL_TABLET | Freq: Two times a day (BID) | ORAL | 0 refills | Status: DC | PRN

## 2018-07-28 MED ORDER — TIZANIDINE HCL 4 MG PO TABS: 2.0000 mg | ORAL_TABLET | Freq: Three times a day (TID) | ORAL | 0 refills | Status: DC | PRN

## 2018-07-28 NOTE — Progress Notes (Signed)
Subjective: CC: fall PCP: Raliegh IpGottschalk, Ashly M, DO ZOX:WRUEAHPI:Misty Clark is a 68 y.o. female presenting to clinic today for:  1. Fall Patient sustained a mechanical fall last Friday.  She notes that she was walking in the dark when she stepped on a log and it rolled from underneath her foot.  She fell onto her left side with both arms outstretched, denies hitting her head.  She notes that she has had some bilateral wrist pain and left shoulder pain since that time.  Denies any gross discoloration including bruising or swelling of the shoulder.  However she has had some soft tissue swelling of bilateral wrists.  She is here for evaluation of this.  She does note that pain is somewhat better but is still present and seems to radiate from wrist to forearms.  She has been using Tylenol.  She reports history of left shoulder repair in the past.  She notes preserved range of motion at home.   ROS: Per HPI  Allergies  Allergen Reactions  . Codeine     REACTION: vomiting  . Sulfonamide Derivatives     REACTION: rash   Past Medical History:  Diagnosis Date  . Allergy   . Anxiety   . Arthritis   . Hypertension   . IBS (irritable bowel syndrome)    Mixed  . Multiple allergies     Current Outpatient Medications:  .  albuterol (PROVENTIL HFA;VENTOLIN HFA) 108 (90 Base) MCG/ACT inhaler, Inhale 2 puffs into the lungs every 6 (six) hours as needed for wheezing or shortness of breath., Disp: 1 Inhaler, Rfl: 0 .  ALPRAZolam (XANAX) 0.5 MG tablet, Take 0.5-1 tablets (0.25-0.5 mg total) by mouth 2 (two) times daily as needed for anxiety., Disp: 14 tablet, Rfl: 0 .  amoxicillin-clavulanate (AUGMENTIN) 875-125 MG tablet, Take 1 tablet by mouth 2 (two) times daily., Disp: 20 tablet, Rfl: 0 .  atorvastatin (LIPITOR) 40 MG tablet, Take 1 tablet (40 mg total) by mouth daily., Disp: 90 tablet, Rfl: 3 .  benzonatate (TESSALON PERLES) 100 MG capsule, Take 1 capsule (100 mg total) by mouth 3 (three) times daily  as needed., Disp: 20 capsule, Rfl: 1 .  citalopram (CELEXA) 20 MG tablet, Take 1 tablet (20 mg total) by mouth daily., Disp: 90 tablet, Rfl: 1 .  ipratropium (ATROVENT) 0.06 % nasal spray, 2 sprays by Each Nare route Three (3) times a day., Disp: 45 mL, Rfl: 0 .  loratadine (CLARITIN) 10 MG tablet, Take 1 tablet (10 mg total) by mouth daily., Disp: 30 tablet, Rfl: 11 .  montelukast (SINGULAIR) 10 MG tablet, Take 1 tablet (10 mg total) by mouth at bedtime., Disp: 90 tablet, Rfl: 1 .  tiZANidine (ZANAFLEX) 4 MG tablet, Take 0.5-1 tablets (2-4 mg total) by mouth every 8 (eight) hours as needed for muscle spasms., Disp: 30 tablet, Rfl: 0 .  triamterene-hydrochlorothiazide (MAXZIDE-25) 37.5-25 MG tablet, Take 1 tablet by mouth daily., Disp: 90 tablet, Rfl: 1 Social History   Socioeconomic History  . Marital status: Widowed    Spouse name: Not on file  . Number of children: Not on file  . Years of education: Not on file  . Highest education level: Not on file  Occupational History  . Occupation: Retired Nursing    Comment: Engineer, [employer] unit/ HH  Social Needs  . Financial resource strain: Not on file  . Food insecurity:    Worry: Not on file    Inability: Not on file  . Transportation needs:  Medical: Not on file    Non-medical: Not on file  Tobacco Use  . Smoking status: Former Smoker    Packs/day: 0.50  . Smokeless tobacco: Never Used  . Tobacco comment: Quit many years ago.  Substance and Sexual Activity  . Alcohol use: No  . Drug use: Never  . Sexual activity: Not on file  Lifestyle  . Physical activity:    Days per week: Not on file    Minutes per session: Not on file  . Stress: Not on file  Relationships  . Social connections:    Talks on phone: Not on file    Gets together: Not on file    Attends religious service: Not on file    Active member of club or organization: Not on file    Attends meetings of clubs or organizations: Not on file    Relationship status: Not on  file  . Intimate partner violence:    Fear of current or ex partner: Not on file    Emotionally abused: Not on file    Physically abused: Not on file    Forced sexual activity: Not on file  Other Topics Concern  . Not on file  Social History Narrative  . Not on file   Family History  Problem Relation Age of Onset  . Heart failure Maternal Grandmother        CHF  . Breast cancer Sister   . Heart attack Paternal Grandfather        MI  . Coronary artery disease Paternal Grandfather        At a premature age  . Coronary artery disease Other   . Colon cancer Other   . Prostate cancer Other   . Ovarian cancer Other   . Asthma Mother   . Thyroid disease Mother   . Heart disease Father   . Hypertension Daughter     Objective: Office vital signs reviewed. BP 137/88   Pulse (!) 108   Temp 98.7 F (37.1 C) (Oral)   Ht 5\' 5"  (1.651 m)   Wt 161 lb (73 kg)   BMI 26.79 kg/m   Physical Examination:  General: Awake, alert, well nourished, No acute distress Extremities: warm, well perfused, No edema, cyanosis or clubbing; +2 pulses bilaterally MSK: normal gait and station  Shoulder: Patient has full active range of motion all planes but does have some discomfort with internal rotation of the left shoulder.  No visible deformities.  No palpable deformities.  Shoulder discomfort is not reproduced by palpation.  She has no gross swelling or discoloration.  Strength preserved.  Wrists: She does have pain throughout the entire left wrist but palpation does not seem to exacerbate.  She has full active range of motion in bilateral wrists.  The right wrist does have a bony prominence along the radial aspect of the dorsum of the wrist.  She is mildly tender to palpation over this area.  Certainly no discoloration. Skin: dry; intact; no rashes or lesions Neuro: 5/5 UE Strength and light touch sensation grossly intact  No results found.  Assessment/ Plan: 68 y.o. female   1. Fall, initial  encounter Mechanical. - DG Wrist Complete Right; Future  2. Right wrist pain Given bony prominence, question dislocation of 1 of the wrist bones.  I ordered a wrist x-ray to further evaluate.  I could not appreciate for personal review of x-ray anything that demonstrated dislocation of the bones of the wrist.  Certainly no fractures were  appreciated.  I am awaiting formal read by radiology we will contact the patient once this read is available.  For now, continue home care regimen - DG Wrist Complete Right; Future  3. Acute pain of left shoulder No evidence of dislocation, fracture or rotator cuff tear.  Continue supportive care.   Orders Placed This Encounter  Procedures  . DG Wrist Complete Right    Standing Status:   Future    Number of Occurrences:   1    Standing Expiration Date:   09/26/2019    Order Specific Question:   Reason for Exam (SYMPTOM  OR DIAGNOSIS REQUIRED)    Answer:   fall 1 week ago bony deformity on the dorsal aspect.    Order Specific Question:   Preferred imaging location?    Answer:   Internal    Order Specific Question:   Radiology Contrast Protocol - do NOT remove file path    Answer:   [email or web address]   Meds ordered this encounter  Medications  . tiZANidine (ZANAFLEX) 4 MG tablet    Sig: Take 0.5-1 tablets (2-4 mg total) by mouth every 8 (eight) hours as needed for muscle spasms.    Dispense:  30 tablet    Refill:  0  . ALPRAZolam (XANAX) 0.5 MG tablet    Sig: Take 0.5-1 tablets (0.25-0.5 mg total) by mouth 2 (two) times daily as needed for anxiety.    Dispense:  14 tablet    Refill:  0   The Narcotic Database has been reviewed.  There were no red flags.   Patient noted continued rare use of alprazolam.  She has about 6 tablets left and asked for refill during today's visit.  This was placed.  Last fill was in May 2019.   Raliegh Ip, DO [facility] [phone]

## 2018-07-28 NOTE — Patient Instructions (Signed)
Continue current care for pain.  Nothing seems broken.  I have sent in med refills.  I will contact you with the formal xray read by the radiologist when available.   Contusion  A contusion is a deep bruise. Contusions happen when an injury causes bleeding under the skin. Symptoms of bruising include pain, swelling, and discolored skin. The skin may turn blue, purple, or yellow. Follow these instructions at home:  Rest the injured area.  If told, put ice on the injured area. ? Put ice in a plastic bag. ? Place a towel between your skin and the bag. ? Leave the ice on for 20 minutes, 2-3 times per day.  If told, put light pressure (compression) on the injured area using an elastic bandage. Make sure the bandage is not too tight. Remove it and put it back on as told by your doctor.  If possible, raise (elevate) the injured area above the level of your heart while you are sitting or lying down.  Take over-the-counter and prescription medicines only as told by your doctor. Contact a doctor if:  Your symptoms do not get better after several days of treatment.  Your symptoms get worse.  You have trouble moving the injured area. Get help right away if:  You have very bad pain.  You have a loss of feeling (numbness) in a hand or foot.  Your hand or foot turns pale or cold. This information is not intended to replace advice given to you by your health care provider. Make sure you discuss any questions you have with your health care provider. Document Released: 12/08/2007 Document Revised: 11/27/2015 Document Reviewed: 11/06/2014 Elsevier Interactive Patient Education  2019 [employer].

## 2018-08-04 ENCOUNTER — Ambulatory Visit: Payer: Medicare HMO | Admitting: Family Medicine

## 2018-08-09 ENCOUNTER — Encounter: Payer: Self-pay | Admitting: Family Medicine

## 2018-08-09 ENCOUNTER — Ambulatory Visit (INDEPENDENT_AMBULATORY_CARE_PROVIDER_SITE_OTHER): Payer: Medicare HMO | Admitting: Family Medicine

## 2018-08-09 VITALS — BP 141/88 | HR 75 | Temp 97.3°F | Ht 65.0 in | Wt 163.0 lb

## 2018-08-09 DIAGNOSIS — J069 Acute upper respiratory infection, unspecified: Secondary | ICD-10-CM

## 2018-08-09 NOTE — Progress Notes (Signed)
Subjective: CC: URI PCP: Raliegh Ip, DO KHT:XHFSF Misty Clark is a 68 y.o. female presenting to clinic today for:  1.  URI Patient reports onset of mildly productive cough with clear sputum 2 days ago.  She reports some chills but no fevers, no hemoptysis, no shortness of breath or wheeze.  She had similar but much worse symptoms 1 month ago that totally resolved after antibiotics.  She states that she started taking Coricidin last evening and has noticed some improvement in the drainage.  She has a granddaughter who is also ill.   ROS: Per HPI  Allergies  Allergen Reactions  . Codeine     REACTION: vomiting  . Sulfonamide Derivatives     REACTION: rash   Past Medical History:  Diagnosis Date  . Allergy   . Anxiety   . Arthritis   . Hypertension   . IBS (irritable bowel syndrome)    Mixed  . Multiple allergies     Current Outpatient Medications:  .  albuterol (PROVENTIL HFA;VENTOLIN HFA) 108 (90 Base) MCG/ACT inhaler, Inhale 2 puffs into the lungs every 6 (six) hours as needed for wheezing or shortness of breath., Disp: 1 Inhaler, Rfl: 0 .  ALPRAZolam (XANAX) 0.5 MG tablet, Take 0.5-1 tablets (0.25-0.5 mg total) by mouth 2 (two) times daily as needed for anxiety., Disp: 14 tablet, Rfl: 0 .  atorvastatin (LIPITOR) 40 MG tablet, Take 1 tablet (40 mg total) by mouth daily., Disp: 90 tablet, Rfl: 3 .  benzonatate (TESSALON PERLES) 100 MG capsule, Take 1 capsule (100 mg total) by mouth 3 (three) times daily as needed., Disp: 20 capsule, Rfl: 1 .  citalopram (CELEXA) 20 MG tablet, Take 1 tablet (20 mg total) by mouth daily., Disp: 90 tablet, Rfl: 1 .  ipratropium (ATROVENT) 0.06 % nasal spray, 2 sprays by Each Nare route Three (3) times a day., Disp: 45 mL, Rfl: 0 .  loratadine (CLARITIN) 10 MG tablet, Take 1 tablet (10 mg total) by mouth daily., Disp: 30 tablet, Rfl: 11 .  montelukast (SINGULAIR) 10 MG tablet, Take 1 tablet (10 mg total) by mouth at bedtime., Disp: 90  tablet, Rfl: 1 .  tiZANidine (ZANAFLEX) 4 MG tablet, Take 0.5-1 tablets (2-4 mg total) by mouth every 8 (eight) hours as needed for muscle spasms., Disp: 30 tablet, Rfl: 0 .  triamterene-hydrochlorothiazide (MAXZIDE-25) 37.5-25 MG tablet, Take 1 tablet by mouth daily., Disp: 90 tablet, Rfl: 1 Social History   Socioeconomic History  . Marital status: Widowed    Spouse name: Not on file  . Number of children: Not on file  . Years of education: Not on file  . Highest education level: Not on file  Occupational History  . Occupation: Retired Nursing    Comment: Engineer, petroleum [employer]  Social Needs  . Financial resource strain: Not on file  . Food insecurity:    Worry: Not on file    Inability: Not on file  . Transportation needs:    Medical: Not on file    Non-medical: Not on file  Tobacco Use  . Smoking status: Former Smoker    Packs/day: 0.50  . Smokeless tobacco: Never Used  . Tobacco comment: Quit many years ago.  Substance and Sexual Activity  . Alcohol use: No  . Drug use: Never  . Sexual activity: Not on file  Lifestyle  . Physical activity:    Days per week: Not on file    Minutes per session: Not on file  .  Stress: Not on file  Relationships  . Social connections:    Talks on phone: Not on file    Gets together: Not on file    Attends religious service: Not on file    Active member of club or organization: Not on file    Attends meetings of clubs or organizations: Not on file    Relationship status: Not on file  . Intimate partner violence:    Fear of current or ex partner: Not on file    Emotionally abused: Not on file    Physically abused: Not on file    Forced sexual activity: Not on file  Other Topics Concern  . Not on file  Social History Narrative  . Not on file   Family History  Problem Relation Age of Onset  . Heart failure Maternal Grandmother        CHF  . Breast cancer Sister   . Heart attack Paternal Grandfather        MI  . Coronary artery  disease Paternal Grandfather        At a premature age  . Coronary artery disease Other   . Colon cancer Other   . Prostate cancer Other   . Ovarian cancer Other   . Asthma Mother   . Thyroid disease Mother   . Heart disease Father   . Hypertension Daughter     Objective: Office vital signs reviewed. BP (!) 141/88   Pulse 75   Temp (!) 97.3 F (36.3 C) (Oral)   Ht 5\' 5"  (1.651 m)   Wt 163 lb (73.9 kg)   BMI 27.12 kg/m   Physical Examination:  General: Awake, alert, well nourished, No acute distress HEENT: Normal    Neck: No masses palpated. No lymphadenopathy    Ears: Tympanic membranes intact, normal light reflex, no erythema, no bulging    Eyes: PERRLA, extraocular membranes intact, sclera white    Nose: nasal turbinates moist, clear nasal discharge    Throat: moist mucus membranes, no erythema, no tonsillar exudate.  Airway is patent Cardio: regular rate and rhythm, S1S2 heard, no murmurs appreciated Pulm: clear to auscultation bilaterally, no wheezes, rhonchi or rales; normal work of breathing on room air  Assessment/ Plan: 68 y.o. female   1. URI with cough and congestion Patient is afebrile and well-appearing.  Her physical exam was fairly unremarkable.  Since symptoms are improving, this is reassuring and likely reflective of a viral URI versus allergy mediated.  Okay to continue Coricidin.  Push clear fluids.  Humidification recommended to thin mucus and improve congestion.  Reasons for return evaluation discussed.  If symptoms persist or worsen, can plan for empiric antibiotics plus or minus oral steroids if needed.   No orders of the defined types were placed in this encounter.  No orders of the defined types were placed in this encounter.    Raliegh IpAshly M Saki Legore, DO [facility] [phone]

## 2018-09-15 ENCOUNTER — Ambulatory Visit: Payer: Medicare HMO | Admitting: Family Medicine

## 2018-09-20 ENCOUNTER — Telehealth: Payer: Self-pay | Admitting: Family Medicine

## 2018-09-20 NOTE — Telephone Encounter (Signed)
Pt aware.

## 2018-09-20 NOTE — Telephone Encounter (Signed)
Please have patient take appropriate measures over-the-counter to treat what sounds like a viral upper respiratory infection.  I have included instructions below.  If she develops any fever, pus from the nose, or her symptoms last greater than 2 weeks have her contact me.  I recommend that you only use cold medications that are safe in high blood pressure like Coricidin (generic is fine).  Other cold medications can increase your blood pressure.  - Get plenty of rest and drink plenty of fluids. - Try to breathe moist air. Use a cold mist humidifier. - Consume warm fluids (soup or tea) to provide relief for a stuffy nose and to loosen phlegm. - For nasal stuffiness, try saline nasal spray or a Neti Pot.  Afrin nasal spray can also be used but this product should not be used longer than 3 days or it will cause rebound nasal stuffiness (worsening nasal congestion). - For sore throat pain relief: use chloraseptic spray, suck on throat lozenges, hard candy or popsicles; gargle with warm salt water (1/4 tsp. salt per 8 oz. of water); and eat soft, bland foods. - Eat a well-balanced diet. If you cannot, ensure you are getting enough nutrients by taking a daily multivitamin. - Avoid dairy products, as they can thicken phlegm. - Avoid alcohol, as it impairs your body's immune system.  CONTACT YOUR DOCTOR IF YOU EXPERIENCE ANY OF THE FOLLOWING: - High fever - Ear pain - Sinus-type headache - Unusually severe cold symptoms - Cough that gets worse while other cold symptoms improve - Flare up of any chronic lung problem, such as asthma - Your symptoms persist longer than 2 weeks

## 2018-09-22 ENCOUNTER — Telehealth: Payer: Self-pay | Admitting: Family Medicine

## 2018-09-22 ENCOUNTER — Other Ambulatory Visit: Payer: Self-pay | Admitting: Family Medicine

## 2018-09-22 MED ORDER — AZITHROMYCIN 250 MG PO TABS: ORAL_TABLET | ORAL | 0 refills | Status: DC

## 2018-09-22 MED ORDER — BENZONATATE 100 MG PO CAPS: 100.0000 mg | ORAL_CAPSULE | Freq: Three times a day (TID) | ORAL | 1 refills | Status: DC | PRN

## 2018-09-22 NOTE — Progress Notes (Signed)
Patient reports a 1.5-week history of cough, congestion, sinus pain and drainage.  She has been using Alka-Seltzer with little improvement in symptoms.  No hemoptysis or shortness of breath.  No known fevers.  A/P We will proceed with empiric treatment with antibiotics.  Z-Pak sent.  Tessalon Perles as needed cough.  Hydrate, rest.  She will have a family member who is not symptomatic pick up her prescriptions.  Meds ordered this encounter  Medications  . benzonatate (TESSALON PERLES) 100 MG capsule    Sig: Take 1 capsule (100 mg total) by mouth 3 (three) times daily as needed.    Dispense:  20 capsule    Refill:  1  . azithromycin (ZITHROMAX) 250 MG tablet    Sig: Take 2 tablets today, then take 1 tablet daily until gone.    Dispense:  6 tablet    Refill:  0    Mathew Storck M. Nadine Counts, DO [facility]

## 2018-09-22 NOTE — Telephone Encounter (Signed)
[facility]  SWITCHBOARD  SICK CALL SCREENING   09/22/2018  Misty Clark    YKD:[id number]    DOB:05-Jan-1951  Best Contact Telephone Number: [phone]   1. Symptoms: Sinus pressure, left ear pain,  Cough, drainage, nasal congestion, unknown fever   2. Symptom Onset: 1 weeks  3. Have you traveled any over the past 14 days? No  4.   Have you been in recent contact with someone that has tested positive for COVID-19? No  5.   Which pharmacy would you use today if given a prescription? [facility] [place]    Follow-Up Plan [name] was informed that this information will be given to a clinical staff member to review and that they should receive a follow-up telephone call within 24 hours. she was advised to call back if she develops any new symptoms or if her current symptoms worsen.   Screened by: Josetta Huddle, LPN

## 2018-10-31 ENCOUNTER — Other Ambulatory Visit: Payer: Self-pay | Admitting: Family Medicine

## 2018-11-01 NOTE — Telephone Encounter (Signed)
Forwarding to Dr. Nadine Counts for approval

## 2018-11-03 NOTE — Telephone Encounter (Signed)
Appointment noted.

## 2018-11-03 NOTE — Telephone Encounter (Signed)
Please put her on the schedule for evisit next week for the xanax.

## 2018-11-06 ENCOUNTER — Other Ambulatory Visit: Payer: Self-pay | Admitting: Family Medicine

## 2018-11-06 ENCOUNTER — Other Ambulatory Visit: Payer: Self-pay

## 2018-11-06 ENCOUNTER — Ambulatory Visit (INDEPENDENT_AMBULATORY_CARE_PROVIDER_SITE_OTHER): Payer: Medicare HMO | Admitting: Family Medicine

## 2018-11-06 DIAGNOSIS — Z1211 Encounter for screening for malignant neoplasm of colon: Secondary | ICD-10-CM | POA: Diagnosis not present

## 2018-11-06 DIAGNOSIS — F411 Generalized anxiety disorder: Secondary | ICD-10-CM | POA: Diagnosis not present

## 2018-11-06 DIAGNOSIS — Z6379 Other stressful life events affecting family and household: Secondary | ICD-10-CM | POA: Diagnosis not present

## 2018-11-06 DIAGNOSIS — R69 Illness, unspecified: Secondary | ICD-10-CM | POA: Diagnosis not present

## 2018-11-06 MED ORDER — CITALOPRAM HYDROBROMIDE 20 MG PO TABS: 20.0000 mg | ORAL_TABLET | Freq: Every day | ORAL | 1 refills | Status: DC

## 2018-11-06 MED ORDER — ALPRAZOLAM 0.5 MG PO TABS: 0.5000 mg | ORAL_TABLET | Freq: Every day | ORAL | 0 refills | Status: DC | PRN

## 2018-11-06 NOTE — Progress Notes (Signed)
Telephone visit  Subjective: CC: GAD PCP: Raliegh Ip, DO KHV:[id number] Misty Clark is a 68 y.o. female calls for telephone consult today. Patient provides verbal consent for consult held via phone.  Location of patient: home Location of provider: [facility] Others present for call: non  1.  Anxiety disorder Patient last seen for anxiety disorder in December.  At that time she had had very good control of anxiety with Celexa.  She reports lately that she feels quite a bit more anxious, and she thinks this is related to ongoing issues with a family member who has lung cancer but there is concern for metastases.  She also has a daughter who is going through divorce and is having quite a bit of stress and anger with regards to the legal issues surrounding that.  She goes on to state that she has another friend who is a nurse that may be infected with COVID-19 and she worries quite a bit about her health.  She feels isolated and frustrated that she is not able to see her family members because of the pandemic.  Though she goes on to state that she is quite fearful with how the pandemic is impacting everything right now.  She continues to use Xanax as needed and very sparingly.  She denies any depression of her breathing, dizziness or falls.  No SI, HI.  2. Colon cancer screen Cologuard was ordered previously but she notes that she is in and off and she does not think it was ever received.  She has not received a result.  ROS: Per HPI  Allergies  Allergen Reactions  . Codeine     REACTION: vomiting  . Sulfonamide Derivatives     REACTION: rash   Past Medical History:  Diagnosis Date  . Allergy   . Anxiety   . Arthritis   . Hypertension   . IBS (irritable bowel syndrome)    Mixed  . Multiple allergies     Current Outpatient Medications:  .  albuterol (VENTOLIN HFA) 108 (90 Base) MCG/ACT inhaler, INHALE 2 PUFFS BY MOUTH EVERY 6 HOURS AS NEEDED FOR WHEEZING FOR SHORTNESS OF BREATH, Disp: 18  g, Rfl: 0 .  ALPRAZolam (XANAX) 0.5 MG tablet, Take 0.5-1 tablets (0.25-0.5 mg total) by mouth 2 (two) times daily as needed for anxiety., Disp: 14 tablet, Rfl: 0 .  atorvastatin (LIPITOR) 40 MG tablet, Take 1 tablet (40 mg total) by mouth daily., Disp: 90 tablet, Rfl: 3 .  azithromycin (ZITHROMAX) 250 MG tablet, Take 2 tablets today, then take 1 tablet daily until gone., Disp: 6 tablet, Rfl: 0 .  benzonatate (TESSALON PERLES) 100 MG capsule, Take 1 capsule (100 mg total) by mouth 3 (three) times daily as needed., Disp: 20 capsule, Rfl: 1 .  citalopram (CELEXA) 20 MG tablet, Take 1 tablet (20 mg total) by mouth daily., Disp: 90 tablet, Rfl: 1 .  ipratropium (ATROVENT) 0.06 % nasal spray, 2 sprays by Each Nare route Three (3) times a day., Disp: 45 mL, Rfl: 0 .  loratadine (CLARITIN) 10 MG tablet, Take 1 tablet (10 mg total) by mouth daily., Disp: 30 tablet, Rfl: 11 .  montelukast (SINGULAIR) 10 MG tablet, Take 1 tablet (10 mg total) by mouth at bedtime., Disp: 90 tablet, Rfl: 1 .  tiZANidine (ZANAFLEX) 4 MG tablet, Take 0.5-1 tablets (2-4 mg total) by mouth every 8 (eight) hours as needed for muscle spasms., Disp: 30 tablet, Rfl: 0 .  triamterene-hydrochlorothiazide (MAXZIDE-25) 37.5-25 MG tablet, Take 1  tablet by mouth daily., Disp: 90 tablet, Rfl: 1  [facility] 11/06/2018 08/09/2018 07/28/2018  Decreased Interest 0 0 0  Down, Depressed, Hopeless 1 0 1  PHQ - 2 Score 1 0 1  Altered sleeping - 0 1  Tired, decreased energy - 0 0  Change in appetite - 0 0  Feeling bad or failure about yourself  - 0 0  Trouble concentrating - 0 0  Moving slowly or fidgety/restless - 0 0  Suicidal thoughts - 0 0  PHQ-9 Score - 0 2  Difficult doing work/chores - - Not difficult at all   GAD 7 : Generalized Anxiety Score 11/06/2018 07/03/2018 03/29/2018 12/26/2017  Nervous, Anxious, on Edge 1 0 1 0  Control/stop worrying 3 0 1 0  Worry too much - different things 3 0 0 0  Trouble relaxing 1 0 0 0   Restless 0 0 0 0  Easily annoyed or irritable 1 0 0 0  Afraid - awful might happen 1 0 1 0  Total GAD 7 Score 10 0 3 0  Anxiety Difficulty Somewhat difficult - Not difficult at all Not difficult at all    Assessment/ Plan: 68 y.o. female   1. Generalized anxiety disorder Somewhat uncontrolled.  I do think this is situational related to the COVID-19 pandemic and home restrictions.  I have renewed her Celexa 20 mg daily.  Have also renewed her Xanax as she is using this very sparingly.  Last refill was in January.  No red flag signs or symptoms.  National cardiac database was reviewed and there were no red flags there.  She will follow-up in 3 months, sooner if needed - citalopram (CELEXA) 20 MG tablet; Take 1 tablet (20 mg total) by mouth daily.  Dispense: 90 tablet; Refill: 1 - ALPRAZolam (XANAX) 0.5 MG tablet; Take 1 tablet (0.5 mg total) by mouth daily as needed for anxiety.  Dispense: 20 tablet; Refill: 0 - Referral to Chronic Care Management Services  2. Stress due to illness of family member I placed a referral to CCM, clinical social work in efforts to obtain counseling services for this patient.  She is very amenable to their services if it is covered by her insurance  3. Stressful life event affecting family As above  4. Screen for colon cancer She did return the Cologuard but notes that she thinks it may have been lost in the mail.  She never received a result nor evidence on her insurance that this was filed.  I will reorder the Cologuard for colon cancer screening. - Cologuard   Start time: 9:46am End time: 10:03am  Total time spent on patient care (including telephone call/ virtual visit): 25 minutes  [name], DO  [facility] [phone]

## 2018-11-06 NOTE — Telephone Encounter (Signed)
Left message to refill albuterol.  Script had been sent but [facility] says they did not get, per patient.

## 2018-11-28 ENCOUNTER — Ambulatory Visit (INDEPENDENT_AMBULATORY_CARE_PROVIDER_SITE_OTHER): Payer: Medicare HMO | Admitting: Family Medicine

## 2018-11-28 ENCOUNTER — Other Ambulatory Visit: Payer: Self-pay

## 2018-11-28 ENCOUNTER — Encounter: Payer: Self-pay | Admitting: Family Medicine

## 2018-11-28 VITALS — BP 130/88

## 2018-11-28 DIAGNOSIS — Z1159 Encounter for screening for other viral diseases: Secondary | ICD-10-CM

## 2018-11-28 DIAGNOSIS — F411 Generalized anxiety disorder: Secondary | ICD-10-CM | POA: Diagnosis not present

## 2018-11-28 DIAGNOSIS — E782 Mixed hyperlipidemia: Secondary | ICD-10-CM | POA: Diagnosis not present

## 2018-11-28 DIAGNOSIS — Z130 Encounter for screening for diseases of the blood and blood-forming organs and certain disorders involving the immune mechanism: Secondary | ICD-10-CM

## 2018-11-28 DIAGNOSIS — J301 Allergic rhinitis due to pollen: Secondary | ICD-10-CM | POA: Diagnosis not present

## 2018-11-28 DIAGNOSIS — R739 Hyperglycemia, unspecified: Secondary | ICD-10-CM

## 2018-11-28 DIAGNOSIS — Z9189 Other specified personal risk factors, not elsewhere classified: Secondary | ICD-10-CM

## 2018-11-28 DIAGNOSIS — I10 Essential (primary) hypertension: Secondary | ICD-10-CM

## 2018-11-28 DIAGNOSIS — R69 Illness, unspecified: Secondary | ICD-10-CM | POA: Diagnosis not present

## 2018-11-28 MED ORDER — TRIAMTERENE-HCTZ 37.5-25 MG PO TABS: 1.0000 | ORAL_TABLET | Freq: Every day | ORAL | 3 refills | Status: DC

## 2018-11-28 MED ORDER — ATORVASTATIN CALCIUM 40 MG PO TABS: 40.0000 mg | ORAL_TABLET | Freq: Every day | ORAL | 3 refills | Status: DC

## 2018-11-28 NOTE — Progress Notes (Signed)
Telephone visit  Subjective: CC: f/u HTN/ HLD PCP: Janora Norlander, DO ZTI:WPYKD Misty Clark is a 68 y.o. female calls for telephone consult today. Patient provides verbal consent for consult held via phone.  Location of patient: home Location of provider: [facility] Others present for call: none  1. Hypertension/ Hyperlipidemia Patient reports compliance with Maxide and lipitor.  Denies CP, SOB, Edema.  She reports BGs have been better since she stopped using the sinus medication.  2. Anxiety She reports controlled symptoms with w/ Celexa and prn use of Xanax.  She reports still feeling worried about going out in public and how she has seen several people not abiding by social distancing.    3. Allergic rhinitis  She reports prn use of allergy medications and reports control of symptoms.  BP has gotten better as above.   ROS: Per HPI  Allergies  Allergen Reactions  . Codeine     REACTION: vomiting  . Sulfonamide Derivatives     REACTION: rash   Past Medical History:  Diagnosis Date  . Allergy   . Anxiety   . Arthritis   . Hypertension   . IBS (irritable bowel syndrome)    Mixed  . Multiple allergies     Current Outpatient Medications:  .  albuterol (VENTOLIN HFA) 108 (90 Base) MCG/ACT inhaler, INHALE 2 PUFFS BY MOUTH EVERY 6 HOURS AS NEEDED FOR WHEEZING FOR SHORTNESS OF BREATH, Disp: 18 g, Rfl: 0 .  ALPRAZolam (XANAX) 0.5 MG tablet, Take 1 tablet (0.5 mg total) by mouth daily as needed for anxiety., Disp: 20 tablet, Rfl: 0 .  atorvastatin (LIPITOR) 40 MG tablet, Take 1 tablet (40 mg total) by mouth daily., Disp: 90 tablet, Rfl: 3 .  citalopram (CELEXA) 20 MG tablet, Take 1 tablet (20 mg total) by mouth daily., Disp: 90 tablet, Rfl: 1 .  ipratropium (ATROVENT) 0.06 % nasal spray, 2 sprays by Each Nare route Three (3) times a day., Disp: 45 mL, Rfl: 0 .  loratadine (CLARITIN) 10 MG tablet, Take 1 tablet (10 mg total) by mouth daily., Disp: 30 tablet, Rfl: 11 .  montelukast  (SINGULAIR) 10 MG tablet, Take 1 tablet (10 mg total) by mouth at bedtime., Disp: 90 tablet, Rfl: 1 .  tiZANidine (ZANAFLEX) 4 MG tablet, Take 0.5-1 tablets (2-4 mg total) by mouth every 8 (eight) hours as needed for muscle spasms., Disp: 30 tablet, Rfl: 0 .  triamterene-hydrochlorothiazide (MAXZIDE-25) 37.5-25 MG tablet, Take 1 tablet by mouth daily., Disp: 90 tablet, Rfl: 1 .  triprolidine-pseudoephedrine (APRODINE) 2.5-60 MG TABS tablet, Take 1 tablet by mouth daily as needed., Disp: , Rfl:   Blood pressure 130/88.  Assessment/ Plan: 68 y.o. female   1. Essential hypertension, benign Controlled. Check CMP.   - triamterene-hydrochlorothiazide (MAXZIDE-25) 37.5-25 MG tablet; Take 1 tablet by mouth daily.  Dispense: 90 tablet; Refill: 3 - CMP14+EGFR; Future  2. Mixed hyperlipidemia Due for fasting lipid panel.  This has been ordered.  Lipitor renewed. - atorvastatin (LIPITOR) 40 MG tablet; Take 1 tablet (40 mg total) by mouth daily.  Dispense: 90 tablet; Refill: 3 - Lipid Panel; Future - CMP14+EGFR; Future  3. Seasonal allergic rhinitis due to pollen Controlled.   4. Generalized anxiety disorder Controlled.  Continue current therapy  5. Elevated blood sugar Noted to be 100 on last panel. Check A1c - Bayer DCA Hb A1c Waived; Future  6. Screening, anemia, deficiency, iron - CBC; Future   Start time: 10:59am End time: 11:11am  Total time spent  on patient care (including telephone call/ virtual visit): 15 minutes  [place], [place] [phone]

## 2018-12-04 ENCOUNTER — Telehealth: Payer: Self-pay | Admitting: Family Medicine

## 2019-01-09 DIAGNOSIS — Z0100 Encounter for examination of eyes and vision without abnormal findings: Secondary | ICD-10-CM | POA: Diagnosis not present

## 2019-03-07 ENCOUNTER — Telehealth: Payer: Self-pay | Admitting: Family Medicine

## 2019-03-07 NOTE — Telephone Encounter (Signed)
Attempted to contact patient - NA °

## 2019-03-08 ENCOUNTER — Encounter: Payer: Self-pay | Admitting: Family Medicine

## 2019-03-08 ENCOUNTER — Ambulatory Visit (INDEPENDENT_AMBULATORY_CARE_PROVIDER_SITE_OTHER): Payer: Medicare HMO | Admitting: Family Medicine

## 2019-03-08 DIAGNOSIS — J0100 Acute maxillary sinusitis, unspecified: Secondary | ICD-10-CM

## 2019-03-08 MED ORDER — AZITHROMYCIN 250 MG PO TABS: ORAL_TABLET | ORAL | 0 refills | Status: DC

## 2019-03-08 NOTE — Progress Notes (Signed)
Virtual Visit via telephone Note  I connected with Misty Clark on 03/08/19 at 1725 by telephone and verified that I am speaking with the correct person using two identifiers. Misty Clark is currently located at car and no other people are currently with her during visit. The provider, Fransisca Kaufmann Dettinger, MD is located in their office at time of visit.  Call ended at 1732  I discussed the limitations, risks, security and privacy concerns of performing an evaluation and management service by telephone and the availability of in person appointments. I also discussed with the patient that there may be a patient responsible charge related to this service. The patient expressed understanding and agreed to proceed.   History and Present Illness: Patient is calling in with sinus pressure and pain in her cheeks and stuffed in head and ears.  She has used nasal saline and flonase.  She denies any fevers or chills. She also takes zyrtec. She denies covid contacts  No diagnosis found.  Outpatient Encounter Medications as of 03/08/2019  Medication Sig  . albuterol (VENTOLIN HFA) 108 (90 Base) MCG/ACT inhaler INHALE 2 PUFFS BY MOUTH EVERY 6 HOURS AS NEEDED FOR WHEEZING FOR SHORTNESS OF BREATH  . ALPRAZolam (XANAX) 0.5 MG tablet Take 1 tablet (0.5 mg total) by mouth daily as needed for anxiety.  Marland Kitchen atorvastatin (LIPITOR) 40 MG tablet Take 1 tablet (40 mg total) by mouth daily.  . citalopram (CELEXA) 20 MG tablet Take 1 tablet (20 mg total) by mouth daily.  Marland Kitchen ipratropium (ATROVENT) 0.06 % nasal spray 2 sprays by Each Nare route Three (3) times a day.  . loratadine (CLARITIN) 10 MG tablet Take 1 tablet (10 mg total) by mouth daily.  . montelukast (SINGULAIR) 10 MG tablet Take 1 tablet (10 mg total) by mouth at bedtime.  Marland Kitchen tiZANidine (ZANAFLEX) 4 MG tablet Take 0.5-1 tablets (2-4 mg total) by mouth every 8 (eight) hours as needed for muscle spasms.  Marland Kitchen triamterene-hydrochlorothiazide (MAXZIDE-25)  37.5-25 MG tablet Take 1 tablet by mouth daily.  Marland Kitchen triprolidine-pseudoephedrine (APRODINE) 2.5-60 MG TABS tablet Take 1 tablet by mouth daily as needed.   No facility-administered encounter medications on file as of 03/08/2019.     Review of Systems  Constitutional: Negative for chills and fever.  HENT: Positive for congestion, postnasal drip, sinus pressure, sneezing and sore throat. Negative for ear discharge, ear pain and rhinorrhea.   Eyes: Negative for pain, redness and visual disturbance.  Respiratory: Positive for cough. Negative for chest tightness, shortness of breath and wheezing.   Cardiovascular: Negative for chest pain and leg swelling.  Musculoskeletal: Negative for back pain and gait problem.  Skin: Negative for rash.  Neurological: Negative for light-headedness and headaches.  Psychiatric/Behavioral: Negative for agitation and behavioral problems.  All other systems reviewed and are negative.   Observations/Objective: Patient sounds comfortable  Assessment and Plan: Problem List Items Addressed This Visit    None    Visit Diagnoses    Acute non-recurrent maxillary sinusitis    -  Primary   Relevant Medications   azithromycin (ZITHROMAX) 250 MG tablet       Follow Up Instructions: Recommended to quarantine until symptoms are gone  She doesn't want covid testing   I discussed the assessment and treatment plan with the patient. The patient was provided an opportunity to ask questions and all were answered. The patient agreed with the plan and demonstrated an understanding of the instructions.   The patient was advised to  call back or seek an in-person evaluation if the symptoms worsen or if the condition fails to improve as anticipated.  The above assessment and management plan was discussed with the patient. The patient verbalized understanding of and has agreed to the management plan. Patient is aware to call the clinic if symptoms persist or worsen. Patient is  aware when to return to the clinic for a follow-up visit. Patient educated on when it is appropriate to go to the emergency department.    I provided 7 minutes of non-face-to-face time during this encounter.    Nils PyleJoshua A Dettinger, MD

## 2019-03-09 NOTE — Telephone Encounter (Signed)
Script sent in by covering provider.

## 2019-03-19 ENCOUNTER — Other Ambulatory Visit (HOSPITAL_COMMUNITY): Payer: Self-pay | Admitting: Family Medicine

## 2019-03-19 DIAGNOSIS — Z1231 Encounter for screening mammogram for malignant neoplasm of breast: Secondary | ICD-10-CM

## 2019-04-18 ENCOUNTER — Ambulatory Visit (HOSPITAL_COMMUNITY): Payer: Medicare HMO

## 2019-04-25 ENCOUNTER — Ambulatory Visit (HOSPITAL_COMMUNITY): Payer: Medicare HMO

## 2019-05-23 ENCOUNTER — Other Ambulatory Visit: Payer: Self-pay

## 2019-05-23 ENCOUNTER — Ambulatory Visit (INDEPENDENT_AMBULATORY_CARE_PROVIDER_SITE_OTHER): Payer: Medicare HMO | Admitting: Family Medicine

## 2019-05-23 DIAGNOSIS — F4321 Adjustment disorder with depressed mood: Secondary | ICD-10-CM

## 2019-05-23 DIAGNOSIS — F411 Generalized anxiety disorder: Secondary | ICD-10-CM

## 2019-05-23 DIAGNOSIS — R05 Cough: Secondary | ICD-10-CM

## 2019-05-23 DIAGNOSIS — B9689 Other specified bacterial agents as the cause of diseases classified elsewhere: Secondary | ICD-10-CM

## 2019-05-23 DIAGNOSIS — J019 Acute sinusitis, unspecified: Secondary | ICD-10-CM | POA: Diagnosis not present

## 2019-05-23 DIAGNOSIS — R69 Illness, unspecified: Secondary | ICD-10-CM | POA: Diagnosis not present

## 2019-05-23 DIAGNOSIS — R059 Cough, unspecified: Secondary | ICD-10-CM

## 2019-05-23 MED ORDER — ALPRAZOLAM 0.5 MG PO TABS: 0.5000 mg | ORAL_TABLET | Freq: Every day | ORAL | 0 refills | Status: DC | PRN

## 2019-05-23 MED ORDER — BENZONATATE 100 MG PO CAPS: 100.0000 mg | ORAL_CAPSULE | Freq: Three times a day (TID) | ORAL | 0 refills | Status: DC | PRN

## 2019-05-23 MED ORDER — AZITHROMYCIN 250 MG PO TABS: ORAL_TABLET | ORAL | 0 refills | Status: DC

## 2019-05-23 NOTE — Progress Notes (Signed)
Telephone visit  Subjective: CC: sinus PCP: Janora Norlander, DO ERX:Misty Clark is a 68 y.o. female calls for telephone consult today. Patient provides verbal consent for consult held via phone.  Location of patient: home Location of provider: Working remotely from home Others present for call: none  1. Sinusitis Patient reports onset about 1 week ago.  She describes nasal stuffiness, maxillary sinus pain and ear pressure in the left ear.  No fever, nausea, vomiting, diarrhea.  She reports that she had a few family members that were positive for [place].  She got tested and was negative.  She reports mild wheezing with coughing spells.  She reports good response to her inhaler.  No hemoptysis.  She reports post nasal drip.  2.  Grief Patient reports increased anxiety surrounding the death of 2 family members.  She had one passed away from complications of breast cancer and the other an unknown reason for death.  She notes that her nephew was found deceased in his home in [place].  He had already been decomposing for about a week when they found his body.  She unfortunately had to give this information to his family.  She notes stress surrounding these events and is trying to hold together as best as possible.  She did on 1 occasion increase her Celexa to 40 mg daily and felt that it did help some.  She has not done this again since that time because she was worried about continuing this without the supervision of a physician.  She has about 6 pills of her alprazolam left and is using this very sparingly, her last refill was back in May for #20.  ROS: Per HPI  Allergies  Allergen Reactions  . Codeine     REACTION: vomiting  . Sulfonamide Derivatives     REACTION: rash   Past Medical History:  Diagnosis Date  . Allergy   . Anxiety   . Arthritis   . Hypertension   . IBS (irritable bowel syndrome)    Mixed  . Multiple allergies     Current Outpatient Medications:  .   albuterol (VENTOLIN HFA) 108 (90 Base) MCG/ACT inhaler, INHALE 2 PUFFS BY MOUTH EVERY 6 HOURS AS NEEDED FOR WHEEZING FOR SHORTNESS OF BREATH, Disp: 18 g, Rfl: 0 .  ALPRAZolam (XANAX) 0.5 MG tablet, Take 1 tablet (0.5 mg total) by mouth daily as needed for anxiety., Disp: 20 tablet, Rfl: 0 .  atorvastatin (LIPITOR) 40 MG tablet, Take 1 tablet (40 mg total) by mouth daily., Disp: 90 tablet, Rfl: 3 .  azithromycin (ZITHROMAX) 250 MG tablet, Take 2 the first day and then one each day after., Disp: 6 tablet, Rfl: 0 .  citalopram (CELEXA) 20 MG tablet, Take 1 tablet (20 mg total) by mouth daily., Disp: 90 tablet, Rfl: 1 .  ipratropium (ATROVENT) 0.06 % nasal spray, 2 sprays by Each Nare route Three (3) times a day., Disp: 45 mL, Rfl: 0 .  loratadine (CLARITIN) 10 MG tablet, Take 1 tablet (10 mg total) by mouth daily., Disp: 30 tablet, Rfl: 11 .  montelukast (SINGULAIR) 10 MG tablet, Take 1 tablet (10 mg total) by mouth at bedtime., Disp: 90 tablet, Rfl: 1 .  tiZANidine (ZANAFLEX) 4 MG tablet, Take 0.5-1 tablets (2-4 mg total) by mouth every 8 (eight) hours as needed for muscle spasms., Disp: 30 tablet, Rfl: 0 .  triamterene-hydrochlorothiazide (MAXZIDE-25) 37.5-25 MG tablet, Take 1 tablet by mouth daily., Disp: 90 tablet, Rfl: 3 .  triprolidine-pseudoephedrine (  APRODINE) 2.5-60 MG TABS tablet, Take 1 tablet by mouth daily as needed., Disp: , Rfl:   Assessment/ Plan: 68 y.o. female   1. Acute bacterial sinusitis Given refractory nature to conservative measures will empirically treat with antibiotics.  She responded really well to the Z-Pak earlier this year and therefore we have repeated this.  I have also sent in [facility].  She had recent Covid testing which was negative.  But we discussed low threshold to repeat testing if symptoms do not resolve as expected.  She understands reasons for emergent evaluation emergency department.  She will follow-up as needed - azithromycin (ZITHROMAX) 250 MG  tablet; Take 2 tablets today, then take 1 tablet daily until gone.  Dispense: 6 tablet; Refill: 0 - benzonatate (TESSALON PERLES) 100 MG capsule; Take 1 capsule (100 mg total) by mouth 3 (three) times daily as needed.  Dispense: 30 capsule; Refill: 0  2. Cough - benzonatate (TESSALON PERLES) 100 MG capsule; Take 1 capsule (100 mg total) by mouth 3 (three) times daily as needed.  Dispense: 30 capsule; Refill: 0  3. Generalized anxiety disorder Some increased situational anxiety surrounding to family members death.  We did consider and discuss increasing dose of the Celexa but given age she has an increased risk of arrhythmia at higher doses.  We discussed max recommended dose is 20 mg daily.  Because this is situational, I anticipate that it will improve after she has gone through the grieving process.  I have renewed her Xanax to have on hand but asked that she uses very sparingly.  The narcotic database was reviewed and there were no red flags. - ALPRAZolam (XANAX) 0.5 MG tablet; Take 1 tablet (0.5 mg total) by mouth daily as needed for anxiety.  Dispense: 20 tablet; Refill: 0  4. Grief reaction As above.   Start time: 3:14pm End time: 3:26pm  Total time spent on patient care (including telephone call/ virtual visit): 16 minutes  Trinh Sanjose Hulen Skains, DO [facility] [phone]

## 2019-08-03 ENCOUNTER — Other Ambulatory Visit: Payer: Self-pay | Admitting: Family Medicine

## 2019-08-22 ENCOUNTER — Other Ambulatory Visit: Payer: Self-pay | Admitting: Family Medicine

## 2019-08-22 DIAGNOSIS — F411 Generalized anxiety disorder: Secondary | ICD-10-CM

## 2019-08-30 DIAGNOSIS — Z1231 Encounter for screening mammogram for malignant neoplasm of breast: Secondary | ICD-10-CM | POA: Diagnosis not present

## 2019-09-11 ENCOUNTER — Ambulatory Visit (INDEPENDENT_AMBULATORY_CARE_PROVIDER_SITE_OTHER): Payer: Medicare HMO | Admitting: Family Medicine

## 2019-09-11 DIAGNOSIS — F411 Generalized anxiety disorder: Secondary | ICD-10-CM

## 2019-09-11 DIAGNOSIS — J329 Chronic sinusitis, unspecified: Secondary | ICD-10-CM | POA: Diagnosis not present

## 2019-09-11 DIAGNOSIS — R69 Illness, unspecified: Secondary | ICD-10-CM | POA: Diagnosis not present

## 2019-09-11 MED ORDER — MONTELUKAST SODIUM 10 MG PO TABS: 10.0000 mg | ORAL_TABLET | Freq: Every day | ORAL | 1 refills | Status: DC

## 2019-09-11 MED ORDER — BENZONATATE 100 MG PO CAPS: 100.0000 mg | ORAL_CAPSULE | Freq: Three times a day (TID) | ORAL | 0 refills | Status: DC | PRN

## 2019-09-11 MED ORDER — PREDNISONE 20 MG PO TABS: 40.0000 mg | ORAL_TABLET | Freq: Every day | ORAL | 0 refills | Status: AC

## 2019-09-11 MED ORDER — AZITHROMYCIN 250 MG PO TABS: ORAL_TABLET | ORAL | 0 refills | Status: DC

## 2019-09-11 MED ORDER — CITALOPRAM HYDROBROMIDE 20 MG PO TABS: 20.0000 mg | ORAL_TABLET | Freq: Every day | ORAL | 0 refills | Status: DC

## 2019-09-11 NOTE — Progress Notes (Signed)
Telephone visit  Subjective: CC: Sinusitis PCP: Raliegh Ip, DO TDV:VOHYW Misty Clark is a 69 y.o. female calls for telephone consult today. Patient provides verbal consent for consult held via phone.  Due to COVID-19 pandemic this visit was conducted virtually. This visit type was conducted due to national recommendations for restrictions regarding the COVID-19 Pandemic (e.g. social distancing, sheltering in place) in an effort to limit this patient's exposure and mitigate transmission in our community. All issues noted in this document were discussed and addressed.  A physical exam was not performed with this format.   Location of patient: home Location of provider: Working remotely from home Others present for call: none  1. Sinusitis Has been going on for several days now.  Patient reports pain in her cheekbones and her left ear.  This is her typical sinus infection.  Left sinus infection was in the fall and was relieved by use of Z-Pak.  She said several Covid test over the last several months which have been negative.  No fevers, hemoptysis, shortness of breath.  She does have some wheezing which is relieved by her albuterol inhaler.  She does report a mild cough that is nonproductive.  She uses Zyrtec.  She is in need of renewal of the Singulair.   ROS: Per HPI  Allergies  Allergen Reactions  . Codeine     REACTION: vomiting  . Sulfonamide Derivatives     REACTION: rash   Past Medical History:  Diagnosis Date  . Allergy   . Anxiety   . Arthritis   . Hypertension   . IBS (irritable bowel syndrome)    Mixed  . Multiple allergies     Current Outpatient Medications:  .  albuterol (VENTOLIN HFA) 108 (90 Base) MCG/ACT inhaler, INHALE 2 PUFFS BY MOUTH EVERY 6 HOURS AS NEEDED FOR WHEEZING FOR SHORTNESS OF BREATH, Disp: 9 g, Rfl: 0 .  ALPRAZolam (XANAX) 0.5 MG tablet, Take 1 tablet (0.5 mg total) by mouth daily as needed for anxiety., Disp: 20 tablet, Rfl: 0 .   atorvastatin (LIPITOR) 40 MG tablet, Take 1 tablet (40 mg total) by mouth daily., Disp: 90 tablet, Rfl: 3 .  azithromycin (ZITHROMAX) 250 MG tablet, Take 2 tablets today, then take 1 tablet daily until gone., Disp: 6 tablet, Rfl: 0 .  benzonatate (TESSALON PERLES) 100 MG capsule, Take 1 capsule (100 mg total) by mouth 3 (three) times daily as needed., Disp: 30 capsule, Rfl: 0 .  citalopram (CELEXA) 20 MG tablet, Take 1 tablet (20 mg total) by mouth daily. (Needs to be seen before next refill), Disp: 30 tablet, Rfl: 0 .  ipratropium (ATROVENT) 0.06 % nasal spray, 2 sprays by Each Nare route Three (3) times a day., Disp: 45 mL, Rfl: 0 .  loratadine (CLARITIN) 10 MG tablet, Take 1 tablet (10 mg total) by mouth daily., Disp: 30 tablet, Rfl: 11 .  montelukast (SINGULAIR) 10 MG tablet, Take 1 tablet (10 mg total) by mouth at bedtime., Disp: 90 tablet, Rfl: 1 .  triamterene-hydrochlorothiazide (MAXZIDE-25) 37.5-25 MG tablet, Take 1 tablet by mouth daily., Disp: 90 tablet, Rfl: 3 .  triprolidine-pseudoephedrine (APRODINE) 2.5-60 MG TABS tablet, Take 1 tablet by mouth daily as needed., Disp: , Rfl:   Assessment/ Plan: 69 y.o. female   1. Recurrent sinus infections Given refractory nature to over-the-counter and conservative measures.  We will prickly treat for bacterial sinus infection with Z-Pak, prednisone burst and Tessalon Perles.  Home care instructions were reviewed with the patient.  Reasons for return discussed.  She will follow-up as needed - azithromycin (ZITHROMAX) 250 MG tablet; Take 2 tablets today, then take 1 tablet daily until gone.  Dispense: 6 tablet; Refill: 0 - predniSONE (DELTASONE) 20 MG tablet; Take 2 tablets (40 mg total) by mouth daily with breakfast for 5 days.  Dispense: 10 tablet; Refill: 0 - benzonatate (TESSALON PERLES) 100 MG capsule; Take 1 capsule (100 mg total) by mouth 3 (three) times daily as needed.  Dispense: 20 capsule; Refill: 0  2. Generalized anxiety  disorder Renewal of Celexa sent to pharmacy.  I scheduled the patient an appointment for the end of the month.  She will contact me sooner if issues arise. - citalopram (CELEXA) 20 MG tablet; Take 1 tablet (20 mg total) by mouth daily.  Dispense: 90 tablet; Refill: 0   Start time: 2:30pm End time: 2:37pm  Total time spent on patient care (including telephone call/ virtual visit): 16 minutes  [place], [place] [phone]

## 2019-10-03 ENCOUNTER — Ambulatory Visit (INDEPENDENT_AMBULATORY_CARE_PROVIDER_SITE_OTHER): Payer: Medicare HMO

## 2019-10-03 ENCOUNTER — Ambulatory Visit (INDEPENDENT_AMBULATORY_CARE_PROVIDER_SITE_OTHER): Payer: Medicare HMO | Admitting: Family Medicine

## 2019-10-03 ENCOUNTER — Other Ambulatory Visit: Payer: Self-pay

## 2019-10-03 ENCOUNTER — Encounter: Payer: Self-pay | Admitting: Family Medicine

## 2019-10-03 VITALS — BP 138/86 | HR 82 | Temp 98.4°F | Ht 65.0 in | Wt 167.0 lb

## 2019-10-03 DIAGNOSIS — Z1159 Encounter for screening for other viral diseases: Secondary | ICD-10-CM | POA: Diagnosis not present

## 2019-10-03 DIAGNOSIS — R69 Illness, unspecified: Secondary | ICD-10-CM | POA: Diagnosis not present

## 2019-10-03 DIAGNOSIS — Z130 Encounter for screening for diseases of the blood and blood-forming organs and certain disorders involving the immune mechanism: Secondary | ICD-10-CM | POA: Diagnosis not present

## 2019-10-03 DIAGNOSIS — E782 Mixed hyperlipidemia: Secondary | ICD-10-CM | POA: Diagnosis not present

## 2019-10-03 DIAGNOSIS — I10 Essential (primary) hypertension: Secondary | ICD-10-CM | POA: Diagnosis not present

## 2019-10-03 DIAGNOSIS — Z780 Asymptomatic menopausal state: Secondary | ICD-10-CM | POA: Diagnosis not present

## 2019-10-03 DIAGNOSIS — Z1211 Encounter for screening for malignant neoplasm of colon: Secondary | ICD-10-CM | POA: Diagnosis not present

## 2019-10-03 DIAGNOSIS — J309 Allergic rhinitis, unspecified: Secondary | ICD-10-CM

## 2019-10-03 DIAGNOSIS — F411 Generalized anxiety disorder: Secondary | ICD-10-CM | POA: Diagnosis not present

## 2019-10-03 DIAGNOSIS — M8588 Other specified disorders of bone density and structure, other site: Secondary | ICD-10-CM | POA: Diagnosis not present

## 2019-10-03 MED ORDER — ATORVASTATIN CALCIUM 40 MG PO TABS: 40.0000 mg | ORAL_TABLET | Freq: Every day | ORAL | 3 refills | Status: DC

## 2019-10-03 MED ORDER — TRIAMTERENE-HCTZ 37.5-25 MG PO TABS: 1.0000 | ORAL_TABLET | Freq: Every day | ORAL | 3 refills | Status: DC

## 2019-10-03 MED ORDER — CITALOPRAM HYDROBROMIDE 20 MG PO TABS: 20.0000 mg | ORAL_TABLET | Freq: Every day | ORAL | 3 refills | Status: DC

## 2019-10-03 NOTE — Progress Notes (Signed)
Subjective: CC: Follow-up hypertension, fasting labs PCP: Janora Norlander, DO [id number] Misty Clark is a 69 y.o. female presenting to clinic today for:  1.  Hypertension with hyperlipidemia Patient reports compliance with her Maxide 25, Lipitor 40 mg daily.  No chest pain.  She does have intermittent shortness of breath but attributes this to allergy symptoms.  See below.  2. GAD w/ panic Stable.  No longer using Xanax.  Relying solely on Celexa.  She notes the passing of a family member recently.  There are plans to go down to [place] next week to do a memorial.  She is trying to move her parents up to [place] as she worries about them living in [place].  3.  Allergy symptoms Patient reports ongoing uncontrolled rhinorrhea, postnasal drip and intermittent coughing.  She uses a nasal saline spray, Atrovent nasal spray, Singulair, Zyrtec and intermittently and albuterol inhaler but symptoms not improving.  She would like to see Dr. Ernst Bowler, an allergist in [place] if possible.  Wants to know if she is a candidate for allergy shots.   ROS: Per HPI  Allergies  Allergen Reactions  . Codeine     REACTION: vomiting  . Sulfonamide Derivatives     REACTION: rash   Past Medical History:  Diagnosis Date  . Allergy   . Anxiety   . Arthritis   . Hypertension   . IBS (irritable bowel syndrome)    Mixed  . Multiple allergies     Current Outpatient Medications:  .  albuterol (VENTOLIN HFA) 108 (90 Base) MCG/ACT inhaler, INHALE 2 PUFFS BY MOUTH EVERY 6 HOURS AS NEEDED FOR WHEEZING FOR SHORTNESS OF BREATH, Disp: 9 g, Rfl: 0 .  ALPRAZolam (XANAX) 0.5 MG tablet, Take 1 tablet (0.5 mg total) by mouth daily as needed for anxiety., Disp: 20 tablet, Rfl: 0 .  atorvastatin (LIPITOR) 40 MG tablet, Take 1 tablet (40 mg total) by mouth daily., Disp: 90 tablet, Rfl: 3 .  cetirizine (ZYRTEC) 10 MG chewable tablet, Chew 10 mg by mouth daily., Disp: , Rfl:  .  citalopram (CELEXA) 20 MG  tablet, Take 1 tablet (20 mg total) by mouth daily., Disp: 90 tablet, Rfl: 0 .  ipratropium (ATROVENT) 0.06 % nasal spray, 2 sprays by Each Nare route Three (3) times a day., Disp: 45 mL, Rfl: 0 .  montelukast (SINGULAIR) 10 MG tablet, Take 1 tablet (10 mg total) by mouth at bedtime., Disp: 90 tablet, Rfl: 1 .  triamterene-hydrochlorothiazide (MAXZIDE-25) 37.5-25 MG tablet, Take 1 tablet by mouth daily., Disp: 90 tablet, Rfl: 3 .  triprolidine-pseudoephedrine (APRODINE) 2.5-60 MG TABS tablet, Take 1 tablet by mouth daily as needed., Disp: , Rfl:  Social History   Socioeconomic History  . Marital status: Widowed    Spouse name: Not on file  . Number of children: Not on file  . Years of education: Not on file  . Highest education level: Not on file  Occupational History  . Occupation: Retired Nursing    Comment: Scientist, research (physical sciences) [employer]  Tobacco Use  . Smoking status: Former Smoker    Packs/day: 0.50  . Smokeless tobacco: Never Used  . Tobacco comment: Quit many years ago.  Substance and Sexual Activity  . Alcohol use: No  . Drug use: Never  . Sexual activity: Not on file  Other Topics Concern  . Not on file  Social History Narrative  . Not on file   Social Determinants of Health   Financial Resource Strain:   .  Difficulty of Paying Living Expenses:   Food Insecurity:   . Worried About Charity fundraiser in the Last Year:   . Arboriculturist in the Last Year:   Transportation Needs:   . Film/video editor (Medical):   Marland Kitchen Lack of Transportation (Non-Medical):   Physical Activity:   . Days of Exercise per Week:   . Minutes of Exercise per Session:   Stress:   . Feeling of Stress :   Social Connections:   . Frequency of Communication with Friends and Family:   . Frequency of Social Gatherings with Friends and Family:   . Attends Religious Services:   . Active Member of Clubs or Organizations:   . Attends Archivist Meetings:   Marland Kitchen Marital Status:   Intimate  Partner Violence:   . Fear of Current or Ex-Partner:   . Emotionally Abused:   Marland Kitchen Physically Abused:   . Sexually Abused:    Family History  Problem Relation Age of Onset  . Heart failure Maternal Grandmother        CHF  . Breast cancer Sister   . Heart attack Paternal Grandfather        MI  . Coronary artery disease Paternal Grandfather        At a premature age  . Coronary artery disease Other   . Colon cancer Other   . Prostate cancer Other   . Ovarian cancer Other   . Asthma Mother   . Thyroid disease Mother   . Heart disease Father   . Hypertension Daughter     Objective: Office vital signs reviewed. BP 138/86   Pulse 82   Temp 98.4 F (36.9 C) (Temporal)   Ht '5\' 5"'  (1.651 m)   Wt 167 lb (75.8 kg)   SpO2 96%   BMI 27.79 kg/m   Physical Examination:  General: Awake, alert, well nourished, No acute distress HEENT: Normal    Neck: No masses palpated. No lymphadenopathy    Ears: Tympanic membranes intact, normal light reflex, no erythema, no bulging    Eyes: PERRLA, extraocular membranes intact, sclera white    Nose: nasal turbinates moist, clear nasal discharge; left nare with hemostatic bleed.    Throat: moist mucus membranes, no erythema, no tonsillar exudate.  Airway is patent Cardio: regular rate and rhythm, S1S2 heard, no murmurs appreciated Pulm: clear to auscultation bilaterally, no wheezes, rhonchi or rales; normal work of breathing on room air Extremities: warm, well perfused, No edema, cyanosis or clubbing; +2 pulses bilaterally MSK: normal gait and station  Assessment/ Plan: 69 y.o. female   1. Allergic rhinitis, unspecified seasonality, unspecified trigger Not controlled.  Referral to allergy placed - Ambulatory referral to Allergy  2. Essential hypertension, benign Controlled.  Continue current regimen - CMP14+EGFR - Lipid Panel - triamterene-hydrochlorothiazide (MAXZIDE-25) 37.5-25 MG tablet; Take 1 tablet by mouth daily.  Dispense: 90  tablet; Refill: 3  3. Mixed hyperlipidemia Check fasting lipid - CMP14+EGFR - Lipid Panel - TSH - atorvastatin (LIPITOR) 40 MG tablet; Take 1 tablet (40 mg total) by mouth daily.  Dispense: 90 tablet; Refill: 3  4. Generalized anxiety disorder Stable.  Xanax discontinued - citalopram (CELEXA) 20 MG tablet; Take 1 tablet (20 mg total) by mouth daily.  Dispense: 90 tablet; Refill: 3  5. Screening, anemia, deficiency, iron - CBC  6. Asymptomatic postmenopausal estrogen deficiency Family history of osteoporosis. - DG [facility] DEXA; Future  7. Encounter for hepatitis C screening test for low  risk patient - Hepatitis C antibody  8. Colon cancer screening Sent in Cologuard but unfortunately never was received. Would like a new one - Cologuard   Orders Placed This Encounter  Procedures  . ToxASSURE Select 13 (MW), Urine  . CMP14+EGFR  . Lipid Panel  . TSH  . CBC   No orders of the defined types were placed in this encounter.    Janora Norlander, DO [place] [phone]

## 2019-10-03 NOTE — Patient Instructions (Signed)

## 2019-10-04 LAB — CMP14+EGFR
ALT: 13 IU/L (ref 0–32)
AST: 14 IU/L (ref 0–40)
Albumin/Globulin Ratio: 2.1 (ref 1.2–2.2)
Albumin: 4.6 g/dL (ref 3.8–4.8)
Alkaline Phosphatase: 73 IU/L (ref 39–117)
BUN/Creatinine Ratio: 17 (ref 12–28)
BUN: 11 mg/dL (ref 8–27)
Bilirubin Total: 0.4 mg/dL (ref 0.0–1.2)
CO2: 23 mmol/L (ref 20–29)
Calcium: 9.3 mg/dL (ref 8.7–10.3)
Chloride: 94 mmol/L — ABNORMAL LOW (ref 96–106)
Creatinine, Ser: 0.65 mg/dL (ref 0.57–1.00)
GFR calc Af Amer: 105 mL/min/{1.73_m2} (ref >59)
GFR calc non Af Amer: 91 mL/min/{1.73_m2} (ref >59)
Globulin, Total: 2.2 g/dL (ref 1.5–4.5)
Glucose: 99 mg/dL (ref 65–99)
Potassium: 4.1 mmol/L (ref 3.5–5.2)
Sodium: 134 mmol/L (ref 134–144)
Total Protein: 6.8 g/dL (ref 6.0–8.5)

## 2019-10-04 LAB — LIPID PANEL
Chol/HDL Ratio: 4.6 ratio — ABNORMAL HIGH (ref 0.0–4.4)
Cholesterol, Total: 223 mg/dL — ABNORMAL HIGH (ref 100–199)
HDL: 49 mg/dL (ref >39)
LDL Chol Calc (NIH): 144 mg/dL — ABNORMAL HIGH (ref 0–99)
Triglycerides: 167 mg/dL — ABNORMAL HIGH (ref 0–149)
VLDL Cholesterol Cal: 30 mg/dL (ref 5–40)

## 2019-10-04 LAB — HEPATITIS C ANTIBODY: Hep C Virus Ab: <0.1 s/co ratio (ref 0.0–0.9)

## 2019-10-04 LAB — CBC
Hematocrit: 38.4 % (ref 34.0–46.6)
Hemoglobin: 13.7 g/dL (ref 11.1–15.9)
MCH: 30.8 pg (ref 26.6–33.0)
MCHC: 35.7 g/dL (ref 31.5–35.7)
MCV: 86 fL (ref 79–97)
Platelets: 207 10*3/uL (ref 150–450)
RBC: 4.45 x10E6/uL (ref 3.77–5.28)
RDW: 11.3 % — ABNORMAL LOW (ref 11.7–15.4)
WBC: 5.2 10*3/uL (ref 3.4–10.8)

## 2019-10-04 LAB — TSH: TSH: 1.94 u[IU]/mL (ref 0.450–4.500)

## 2019-10-15 ENCOUNTER — Encounter: Payer: Self-pay | Admitting: *Deleted

## 2019-10-15 ENCOUNTER — Telehealth: Payer: Self-pay | Admitting: *Deleted

## 2019-10-15 NOTE — Telephone Encounter (Signed)
Lab results reviewed. She was sick awhile back and missed a few doses of her cholesterol medication but usually takes it regularly.   She has had some troubles lately and been out of town.    She called to find out about a call and it we discovered it was from a referral done to  the allergy clinic.  We gave her their phone number [phone].

## 2019-10-22 ENCOUNTER — Encounter: Payer: Self-pay | Admitting: Family Medicine

## 2019-10-22 ENCOUNTER — Ambulatory Visit (INDEPENDENT_AMBULATORY_CARE_PROVIDER_SITE_OTHER): Payer: Medicare HMO | Admitting: Family Medicine

## 2019-10-22 ENCOUNTER — Telehealth: Payer: Self-pay | Admitting: Family Medicine

## 2019-10-22 DIAGNOSIS — J301 Allergic rhinitis due to pollen: Secondary | ICD-10-CM

## 2019-10-22 DIAGNOSIS — J0100 Acute maxillary sinusitis, unspecified: Secondary | ICD-10-CM

## 2019-10-22 MED ORDER — CEFPROZIL 500 MG PO TABS: 500.0000 mg | ORAL_TABLET | Freq: Two times a day (BID) | ORAL | 0 refills | Status: DC

## 2019-10-22 NOTE — Progress Notes (Signed)
Subjective:    Patient ID: Misty Clark, female    DOB: 06-11-1951, 69 y.o.   MRN: [id number]   HPI: Misty Clark is a 69 y.o. female presenting for Symptoms include congestion, facial pain, nasal congestion, non productive cough, post nasal drip and sinus pressure. There is no fever, chills, or sweats. Onset of symptoms was a few days ago, gradually worsening since that time.     Depression screen [facility] 2/9 10/03/2019 11/06/2018 08/09/2018 07/28/2018 07/03/2018  Decreased Interest 0 0 0 0 0  Down, Depressed, Hopeless 0 1 0 1 0  PHQ - 2 Score 0 1 0 1 0  Altered sleeping 0 - 0 1 0  Tired, decreased energy 0 - 0 0 0  Change in appetite 0 - 0 0 0  Feeling bad or failure about yourself  0 - 0 0 0  Trouble concentrating 0 - 0 0 0  Moving slowly or fidgety/restless 0 - 0 0 0  Suicidal thoughts 0 - 0 0 0  PHQ-9 Score 0 - 0 2 0  Difficult doing work/chores - - - Not difficult at all -     Relevant past medical, surgical, family and social history reviewed and updated as indicated.  Interim medical history since our last visit reviewed. Allergies and medications reviewed and updated.  ROS:  Review of Systems  Constitutional: Negative for activity change, appetite change, chills and fever.  HENT: Positive for congestion, postnasal drip, rhinorrhea and sinus pressure. Negative for ear discharge, ear pain, hearing loss, nosebleeds, sneezing and trouble swallowing.   Respiratory: Negative for chest tightness and shortness of breath.   Cardiovascular: Negative for chest pain and palpitations.  Skin: Negative for rash.     Social History   Tobacco Use  Smoking Status Former Smoker  . Packs/day: 0.50  . Years: 3.00  . Pack years: 1.50  Smokeless Tobacco Never Used  Tobacco Comment   Quit many years ago.       Objective:     Wt Readings from Last 3 Encounters:  10/03/19 167 lb (75.8 kg)  08/09/18 163 lb (73.9 kg)  07/28/18 161 lb (73 kg)     Exam deferred. Pt. Harboring  due to COVID 19. Phone visit performed.   Assessment & Plan:   1. Acute maxillary sinusitis, recurrence not specified   2. Seasonal allergic rhinitis due to pollen     Meds ordered this encounter  Medications  . cefPROZIL (CEFZIL) 500 MG tablet    Sig: Take 1 tablet (500 mg total) by mouth 2 (two) times daily. For infection. Take all of this medication.    Dispense:  20 tablet    Refill:  0    No orders of the defined types were placed in this encounter.     Diagnoses and all orders for this visit:  Acute maxillary sinusitis, recurrence not specified  Seasonal allergic rhinitis due to pollen  Other orders -     cefPROZIL (CEFZIL) 500 MG tablet; Take 1 tablet (500 mg total) by mouth 2 (two) times daily. For infection. Take all of this medication.    Virtual Visit via telephone Note  I discussed the limitations, risks, security and privacy concerns of performing an evaluation and management service by telephone and the availability of in person appointments. The patient was identified with two identifiers. Pt.expressed understanding and agreed to proceed. Pt. Is at home. Dr. Darlyn Read is in his office.  Follow Up Instructions:   I discussed  the assessment and treatment plan with the patient. The patient was provided an opportunity to ask questions and all were answered. The patient agreed with the plan and demonstrated an understanding of the instructions.   The patient was advised to call back or seek an in-person evaluation if the symptoms worsen or if the condition fails to improve as anticipated.   Total minutes including chart review and phone contact time: 9   Follow up plan: Return if symptoms worsen or fail to improve.  Claretta Fraise, MD [place]

## 2019-10-23 ENCOUNTER — Other Ambulatory Visit: Payer: Self-pay | Admitting: Family Medicine

## 2019-10-23 MED ORDER — AZITHROMYCIN 250 MG PO TABS: ORAL_TABLET | ORAL | 0 refills | Status: DC

## 2019-10-23 NOTE — Telephone Encounter (Signed)
Patient done televisit with you on 4/19. Please advise

## 2019-10-23 NOTE — Telephone Encounter (Signed)
Lmtcb.

## 2019-10-23 NOTE — Telephone Encounter (Signed)
Please let her know that I sent in the  Z- pack

## 2019-10-29 NOTE — Telephone Encounter (Signed)
Patient never returned call, encounter closed 

## 2019-11-08 DIAGNOSIS — L237 Allergic contact dermatitis due to plants, except food: Secondary | ICD-10-CM | POA: Diagnosis not present

## 2019-11-14 ENCOUNTER — Ambulatory Visit: Payer: Medicare HMO | Admitting: Allergy & Immunology

## 2019-11-20 ENCOUNTER — Other Ambulatory Visit: Payer: Self-pay

## 2019-11-20 ENCOUNTER — Ambulatory Visit (INDEPENDENT_AMBULATORY_CARE_PROVIDER_SITE_OTHER): Payer: Medicare HMO | Admitting: Family Medicine

## 2019-11-20 VITALS — BP 138/81 | HR 87 | Temp 97.8°F | Wt 162.0 lb

## 2019-11-20 DIAGNOSIS — R238 Other skin changes: Secondary | ICD-10-CM

## 2019-11-20 DIAGNOSIS — R233 Spontaneous ecchymoses: Secondary | ICD-10-CM

## 2019-11-20 DIAGNOSIS — L247 Irritant contact dermatitis due to plants, except food: Secondary | ICD-10-CM | POA: Diagnosis not present

## 2019-11-20 MED ORDER — ALPRAZOLAM 0.5 MG PO TABS: 0.5000 mg | ORAL_TABLET | Freq: Every day | ORAL | 0 refills | Status: DC | PRN

## 2019-11-20 MED ORDER — MOMETASONE FUROATE 0.1 % EX CREA: 1.0000 "application " | TOPICAL_CREAM | Freq: Every day | CUTANEOUS | 0 refills | Status: DC

## 2019-11-20 NOTE — Patient Instructions (Signed)
Claritin during day, Zyrtec at night Oatmeal baths Apply topical corticosteroid to affected areas for next 1-2 weeks If no improvement or if worsening, let me know.  We will refer to derm for skin sampling.   Contact Dermatitis Dermatitis is redness, soreness, and swelling (inflammation) of the skin. Contact dermatitis is a reaction to certain substances that touch the skin. Many different substances can cause contact dermatitis. There are two types of contact dermatitis:  Irritant contact dermatitis. This type is caused by something that irritates your skin, such as having dry hands from washing them too often with soap. This type does not require previous exposure to the substance for a reaction to occur. This is the most common type.  Allergic contact dermatitis. This type is caused by a substance that you are allergic to, such as poison ivy. This type occurs when you have been exposed to the substance (allergen) and develop a sensitivity to it. Dermatitis may develop soon after your first exposure to the allergen, or it may not develop until the next time you are exposed and every time thereafter. What are the causes? Irritant contact dermatitis is most commonly caused by exposure to:  Makeup.  Soaps.  Detergents.  Bleaches.  Acids.  Metal salts, such as nickel. Allergic contact dermatitis is most commonly caused by exposure to:  Poisonous plants.  Chemicals.  Jewelry.  Latex.  Medicines.  Preservatives in products, such as clothing. What increases the risk? You are more likely to develop this condition if you have:  A job that exposes you to irritants or allergens.  Certain medical conditions, such as asthma or eczema. What are the signs or symptoms? Symptoms of this condition may occur on your body anywhere the irritant has touched you or is touched by you.  Symptoms include: ? Dryness or flaking. ? Redness. ? Cracks. ? Itching. ? Pain or a burning  feeling. ? Blisters. ? Drainage of small amounts of blood or clear fluid from skin cracks. With allergic contact dermatitis, there may also be swelling in areas such as the eyelids, mouth, or genitals. How is this diagnosed? This condition is diagnosed with a medical history and physical exam.  A patch skin test may be performed to help determine the cause.  If the condition is related to your job, you may need to see an occupational medicine specialist. How is this treated? This condition is treated by checking for the cause of the reaction and protecting your skin from further contact. Treatment may also include:  Steroid creams or ointments. Oral steroid medicines may be needed in more severe cases.  Antibiotic medicines or antibacterial ointments, if a skin infection is present.  Antihistamine lotion or an antihistamine taken by mouth to ease itching.  A bandage (dressing). Follow these instructions at home: Skin care  Moisturize your skin as needed.  Apply cool compresses to the affected areas.  Try applying baking soda paste to your skin. Stir water into baking soda until it reaches a paste-like consistency.  Do not scratch your skin, and avoid friction to the affected area.  Avoid the use of soaps, perfumes, and dyes. Medicines  Take or apply over-the-counter and prescription medicines only as told by your health care provider.  If you were prescribed an antibiotic medicine, take or apply the antibiotic as told by your health care provider. Do not stop using the antibiotic even if your condition improves. Bathing  Try taking a bath with: ? Epsom salts. Follow the instructions on  the packaging. You can get these at your local pharmacy or grocery store. ? Baking soda. Pour a small amount into the bath as directed by your health care provider. ? Colloidal oatmeal. Follow the instructions on the packaging. You can get this at your local pharmacy or grocery store.  Bathe  less frequently, such as every other day.  Bathe in lukewarm water. Avoid using hot water. Bandage care  If you were given a bandage (dressing), change it as told by your health care provider.  Wash your hands with soap and water before and after you change your dressing. If soap and water are not available, use hand sanitizer. General instructions  Avoid the substance that caused your reaction. If you do not know what caused it, keep a journal to try to track what caused it. Write down: ? What you eat. ? What cosmetic products you use. ? What you drink. ? What you wear in the affected area. This includes jewelry.  Check the affected areas every day for signs of infection. Check for: ? More redness, swelling, or pain. ? More fluid or blood. ? Warmth. ? Pus or a bad smell.  Keep all follow-up visits as told by your health care provider. This is important. Contact a health care provider if:  Your condition does not improve with treatment.  Your condition gets worse.  You have signs of infection such as swelling, tenderness, redness, soreness, or warmth in the affected area.  You have a fever.  You have new symptoms. Get help right away if:  You have a severe headache, neck pain, or neck stiffness.  You vomit.  You feel very sleepy.  You notice red streaks coming from the affected area.  Your bone or joint underneath the affected area becomes painful after the skin has healed.  The affected area turns darker.  You have difficulty breathing. Summary  Dermatitis is redness, soreness, and swelling (inflammation) of the skin. Contact dermatitis is a reaction to certain substances that touch the skin.  Symptoms of this condition may occur on your body anywhere the irritant has touched you or is touched by you.  This condition is treated by figuring out what caused the reaction and protecting your skin from further contact. Treatment may also include medicines and skin  care.  Avoid the substance that caused your reaction. If you do not know what caused it, keep a journal to try to track what caused it.  Contact a health care provider if your condition gets worse or you have signs of infection such as swelling, tenderness, redness, soreness, or warmth in the affected area. This information is not intended to replace advice given to you by your health care provider. Make sure you discuss any questions you have with your health care provider. Document Revised: 10/11/2018 Document Reviewed: 01/04/2018 Elsevier Patient Education  2020 [employer].

## 2019-11-20 NOTE — Progress Notes (Signed)
Subjective: CC: Easy bruising PCP: Raliegh Ip, DO [id number] Misty Clark is a 69 y.o. female presenting to clinic today for:  1.  Easy bruising Patient reports that she is been having bruising at the sites of where she has been scratching.  She was diagnosed with a plant contact dermatitis about 2 weeks ago treated with a injected and oral corticosteroid.  While the rash seems to be improving, the bruising is still present.  Bruising did not start until after she started the corticosteroid.  She continues to have quite a bit of pruritus, particularly on her back.  Though vesicles seem to be drying up.  She has been using Zyrtec daily.  She tried using hydroxyzine but this caused significant drowsiness.  She has been so anxious about the itching that she has been using some of her Xanax that she had on reserve.  She has about 4 tablets left but wants know for she can get another supply to keep on hand.  Last refill was back in November for #20.   ROS: Per HPI  Allergies  Allergen Reactions  . Codeine     REACTION: vomiting  . Sulfonamide Derivatives     REACTION: rash   Past Medical History:  Diagnosis Date  . Allergy   . Anxiety   . Arthritis   . Hypertension   . IBS (irritable bowel syndrome)    Mixed  . Multiple allergies     Current Outpatient Medications:  .  albuterol (VENTOLIN HFA) 108 (90 Base) MCG/ACT inhaler, INHALE 2 PUFFS BY MOUTH EVERY 6 HOURS AS NEEDED FOR WHEEZING FOR SHORTNESS OF BREATH, Disp: 9 g, Rfl: 0 .  atorvastatin (LIPITOR) 40 MG tablet, Take 1 tablet (40 mg total) by mouth daily., Disp: 90 tablet, Rfl: 3 .  azithromycin (ZITHROMAX Z-PAK) 250 MG tablet, Take two right away Then one a day for the next 4 days., Disp: 6 each, Rfl: 0 .  cetirizine (ZYRTEC) 10 MG chewable tablet, Chew 10 mg by mouth daily., Disp: , Rfl:  .  citalopram (CELEXA) 20 MG tablet, Take 1 tablet (20 mg total) by mouth daily., Disp: 90 tablet, Rfl: 3 .  ipratropium (ATROVENT) 0.06  % nasal spray, 2 sprays by Each Nare route Three (3) times a day., Disp: 45 mL, Rfl: 0 .  montelukast (SINGULAIR) 10 MG tablet, Take 1 tablet (10 mg total) by mouth at bedtime., Disp: 90 tablet, Rfl: 1 .  triamterene-hydrochlorothiazide (MAXZIDE-25) 37.5-25 MG tablet, Take 1 tablet by mouth daily., Disp: 90 tablet, Rfl: 3 Social History   Socioeconomic History  . Marital status: Widowed    Spouse name: Not on file  . Number of children: Not on file  . Years of education: Not on file  . Highest education level: Not on file  Occupational History  . Occupation: Retired Nursing    Comment: Engineer, [employer]  Tobacco Use  . Smoking status: Former Smoker    Packs/day: 0.50    Years: 3.00    Pack years: 1.50  . Smokeless tobacco: Never Used  . Tobacco comment: Quit many years ago.  Substance and Sexual Activity  . Alcohol use: No  . Drug use: Never  . Sexual activity: Not on file  Other Topics Concern  . Not on file  Social History Narrative  . Not on file   Social Determinants of Health   Financial Resource Strain:   . Difficulty of Paying Living Expenses:   Food Insecurity:   .  Worried About Programme researcher, broadcasting/film/video in the Last Year:   . Barista in the Last Year:   Transportation Needs:   . Freight forwarder (Medical):   Marland Kitchen Lack of Transportation (Non-Medical):   Physical Activity:   . Days of Exercise per Week:   . Minutes of Exercise per Session:   Stress:   . Feeling of Stress :   Social Connections:   . Frequency of Communication with Friends and Family:   . Frequency of Social Gatherings with Friends and Family:   . Attends Religious Services:   . Active Member of Clubs or Organizations:   . Attends Banker Meetings:   Marland Kitchen Marital Status:   Intimate Partner Violence:   . Fear of Current or Ex-Partner:   . Emotionally Abused:   Marland Kitchen Physically Abused:   . Sexually Abused:    Family History  Problem Relation Age of Onset  . Heart failure  Maternal Grandmother        CHF  . Breast cancer Sister   . Heart attack Paternal Grandfather        MI  . Coronary artery disease Paternal Grandfather        At a premature age  . Coronary artery disease Other   . Colon cancer Other   . Prostate cancer Other   . Ovarian cancer Other   . Asthma Mother   . Thyroid disease Mother   . Heart disease Father   . Hypertension Daughter     Objective: Office vital signs reviewed. BP 138/81   Pulse 87   Temp 97.8 F (36.6 C)   Wt 162 lb (73.5 kg)   SpO2 97%   BMI 26.96 kg/m   Physical Examination:  General: Awake, alert, well nourished, No acute distress Skin: Patient with several scabbed over lesions on her upper back.  No active vesicles.  No surrounding erythema, induration or evidence of secondary infection.  She has similar lesions along the upper extremities bilaterally.  She has areas of hyperpigmentation but does not appear to be overt ecchymosis. Psych: Mood stable, speech normal, affect appropriate Depression screen [facility] 2/9 11/20/2019 10/03/2019 11/06/2018  Decreased Interest 0 0 0  Down, Depressed, Hopeless 0 0 1  PHQ - 2 Score 0 0 1  Altered sleeping 0 0 -  Tired, decreased energy 0 0 -  Change in appetite 0 0 -  Feeling bad or failure about yourself  0 0 -  Trouble concentrating 0 0 -  Moving slowly or fidgety/restless 0 0 -  Suicidal thoughts 0 0 -  PHQ-9 Score 0 0 -  Difficult doing work/chores - - -    Assessment/ Plan: 69 y.o. female   1. Irritant contact dermatitis due to plants, except food Claritin in the morning, Zyrtec at night.  Use oatmeal baths.  Continue moisturization.  Topical mometasone cream prescribed.  2. Easy bruisability Likely secondary to oral corticosteroid use.  We discussed that if symptoms do not resolve after rash has resolved, plan for further work-up.   No orders of the defined types were placed in this encounter.  Meds ordered this encounter  Medications  . mometasone (ELOCON)  0.1 % cream    Sig: Apply 1 application topically daily. To affected areas    Dispense:  45 g    Refill:  0  . ALPRAZolam (XANAX) 0.5 MG tablet    Sig: Take 1 tablet (0.5 mg total) by mouth daily as needed for  anxiety.    Dispense:  20 tablet    Refill:  0    The Narcotic Database has been reviewed.  There were no red flags.    Janora Norlander, DO [place] [phone]

## 2019-11-28 ENCOUNTER — Ambulatory Visit (INDEPENDENT_AMBULATORY_CARE_PROVIDER_SITE_OTHER): Payer: Medicare HMO | Admitting: Allergy & Immunology

## 2019-11-28 ENCOUNTER — Encounter: Payer: Self-pay | Admitting: Allergy & Immunology

## 2019-11-28 ENCOUNTER — Other Ambulatory Visit: Payer: Self-pay

## 2019-11-28 VITALS — BP 142/80 | HR 83 | Temp 96.3°F | Resp 18 | Ht 64.9 in | Wt 160.8 lb

## 2019-11-28 DIAGNOSIS — J302 Other seasonal allergic rhinitis: Secondary | ICD-10-CM

## 2019-11-28 DIAGNOSIS — J3089 Other allergic rhinitis: Secondary | ICD-10-CM

## 2019-11-28 DIAGNOSIS — J4530 Mild persistent asthma, uncomplicated: Secondary | ICD-10-CM

## 2019-11-28 MED ORDER — FLUTICASONE PROPIONATE 50 MCG/ACT NA SUSP: 1.0000 | Freq: Every day | NASAL | 5 refills | Status: DC | PRN

## 2019-11-28 MED ORDER — BREO ELLIPTA 100-25 MCG/INH IN AEPB: 1.0000 | INHALATION_SPRAY | Freq: Every day | RESPIRATORY_TRACT | 2 refills | Status: DC

## 2019-11-28 MED ORDER — AZELASTINE HCL 0.1 % NA SOLN
2.0000 | Freq: Two times a day (BID) | NASAL | 5 refills | Status: DC | PRN
Start: 2019-11-28 — End: 2020-01-18

## 2019-11-28 NOTE — Progress Notes (Signed)
NEW PATIENT  Date of Service/Encounter:  11/28/19  Referring provider: Raliegh Ip, DO   Assessment:   Mild persistent asthma, uncomplicated  Seasonal and perennial allergic rhinitis (grasses, trees and indoor molds)  Plan/Recommendations:   1. Chronic rhinitis - Testing today showed: grasses, trees and indoor molds - Copy of test results provided.  - Avoidance measures provided. - Stop taking: your current nose spray - Continue with: Zyrtec (cetirizine) 10mg  tablet once daily - Start taking: Flonase (fluticasone) one spray per nostril daily and Astelin (azelastine) 2 sprays per nostril 1-2 times daily as needed - You can use an extra dose of the antihistamine, if needed, for breakthrough symptoms.  - Consider nasal saline rinses 1-2 times daily to remove allergens from the nasal cavities as well as help with mucous clearance (this is especially helpful to do before the nasal sprays are given) - Consider allergy shots as a means of long-term control. - Allergy shots "re-train" and "reset" the immune system to ignore environmental allergens and decrease the resulting immune response to those allergens (sneezing, itchy watery eyes, runny nose, nasal congestion, etc).    - Allergy shots improve symptoms in 75-85% of patients.  - We can discuss more at the next appointment if the medications are not working for you.  2. Mild persistent asthma, uncomplicated - Lung testing was iffy, but I think this might be related to postnasal drip. - We are going to go ahead and start a daily controller medication (Breo).  - This will help control inflammation within the lungs and help with the coughing. - Spacer sample and demonstration provided. - Daily controller medication(s): Breo 100/18mcg one puff once daily - Prior to physical activity: albuterol 2 puffs 10-15 minutes before physical activity. - Rescue medications: albuterol 4 puffs every 4-6 hours as needed - Asthma control  goals:  * Full participation in all desired activities (may need albuterol before activity) * Albuterol use two time or less a week on average (not counting use with activity) * Cough interfering with sleep two time or less a month * Oral steroids no more than once a year * No hospitalizations  3. Return in about 6 weeks (around 01/09/2020). This can be an in-person, a virtual Webex or a telephone follow up visit.  Subjective:   Misty Clark is a 69 y.o. female presenting today for evaluation of  Chief Complaint  Patient presents with  . Allergies    Sinus congestion and pressure with cough. SOB when active.     Davon Folta has a history of the following: Patient Active Problem List   Diagnosis Date Noted  . Mixed hyperlipidemia 12/26/2017  . Seasonal allergic rhinitis due to pollen 11/18/2017  . Generalized anxiety disorder 06/03/2008  . Essential hypertension, benign 06/03/2008  . GERD 06/03/2008  . Irritable bowel syndrome 06/03/2008  . Nonspecific abnormal results of cardiovascular function study 06/03/2008    History obtained from: chart review and patient.  Misty Clark was referred by [name], DO.     Misty Clark is a 69 y.o. female presenting for an evaluation of coughing and shortness of breath. Her granddaughter sees 78 and highly recommended that she see [place].    Asthma/Respiratory Symptom History: She has been wheezing and coughing for years. She does have an albuterol inhaler which does help. She did not use it this morning but she typically uses it every morning. She smoked when she was in high school but not after that.  Allergic Rhinitis Symptom History: She does report some clear rhinorrhea. She reports that she had a sinus infection 6 or more times this winter. This is fairly normal for her. She had sinus surgery 20 years ago and her symptoms got worse. She does have sensitivity to grasses and cigarette smoke. Her daughter does smoke but not around  her. She has been using her nasal saline and Atrovent nasal spray. She is also on cetirizine which does help somewhat. She has never been allergy tested. She does not see ENT any longer.  Eczema Symptom History: She does have eczema on her head and scalp. She reports dry skin overall. She moisturizes with lotions including shea butter. She does have a topical steroid that she used for poison oak; she was diagnosed fairly recently with that.   Otherwise, there is no history of other atopic diseases, including food allergies, drug allergies, stinging insect allergies, urticaria or contact dermatitis. There is no significant infectious history. Vaccinations are up to date.    Past Medical History: Patient Active Problem List   Diagnosis Date Noted  . Mixed hyperlipidemia 12/26/2017  . Seasonal allergic rhinitis due to pollen 11/18/2017  . Generalized anxiety disorder 06/03/2008  . Essential hypertension, benign 06/03/2008  . GERD 06/03/2008  . Irritable bowel syndrome 06/03/2008  . Nonspecific abnormal results of cardiovascular function study 06/03/2008    Medication List:  Allergies as of 11/28/2019      Reactions   Codeine    REACTION: vomiting   Sulfonamide Derivatives    REACTION: rash      Medication List       Accurate as of Nov 28, 2019  1:30 PM. If you have any questions, ask your nurse or doctor.        STOP taking these medications   montelukast 10 MG tablet Commonly known as: SINGULAIR Stopped by: Valentina Shaggy, MD     TAKE these medications   albuterol 108 (90 Base) MCG/ACT inhaler Commonly known as: VENTOLIN HFA INHALE 2 PUFFS BY MOUTH EVERY 6 HOURS AS NEEDED FOR WHEEZING FOR SHORTNESS OF BREATH   ALPRAZolam 0.5 MG tablet Commonly known as: Xanax Take 1 tablet (0.5 mg total) by mouth daily as needed for anxiety.   atorvastatin 40 MG tablet Commonly known as: LIPITOR Take 1 tablet (40 mg total) by mouth daily.   azelastine 0.1 % nasal  spray Commonly known as: ASTELIN Place 2 sprays into both nostrils 2 (two) times daily as needed for rhinitis. Use as directed. Started by: Valentina Shaggy, MD   Breo Ellipta 100-25 MCG/INH Aepb Generic drug: fluticasone furoate-vilanterol Inhale 1 puff into the lungs daily at 6 (six) AM. Started by: Valentina Shaggy, MD   cetirizine 10 MG chewable tablet Commonly known as: ZYRTEC Chew 10 mg by mouth daily.   citalopram 20 MG tablet Commonly known as: CELEXA Take 1 tablet (20 mg total) by mouth daily.   fluticasone 50 MCG/ACT nasal spray Commonly known as: Flonase Place 1 spray into both nostrils daily as needed for allergies or rhinitis. Started by: Valentina Shaggy, MD   ipratropium 0.06 % nasal spray Commonly known as: ATROVENT 2 sprays by Each Nare route Three (3) times a day.   mometasone 0.1 % cream Commonly known as: Elocon Apply 1 application topically daily. To affected areas   triamterene-hydrochlorothiazide 37.5-25 MG tablet Commonly known as: MAXZIDE-25 Take 1 tablet by mouth daily.       Birth History: non-contributory  Developmental History: non-contributory  Past Surgical History: Past Surgical History:  Procedure Laterality Date  . ABDOMINAL HYSTERECTOMY    . CHOLECYSTECTOMY    . SHOULDER SURGERY Left 2012  . SINOSCOPY    . SINUS SURGERY WITH INSTATRAK       Family History: Family History  Problem Relation Age of Onset  . Heart failure Maternal Grandmother        CHF  . Breast cancer Sister   . Heart attack Paternal Grandfather        MI  . Coronary artery disease Paternal Grandfather        At a premature age  . Coronary artery disease Other   . Colon cancer Other   . Prostate cancer Other   . Ovarian cancer Other   . Asthma Mother   . Thyroid disease Mother   . Heart disease Father   . Hypertension Daughter   . Allergic rhinitis Neg Hx   . Angioedema Neg Hx   . Atopy Neg Hx   . Eczema Neg Hx   . Immunodeficiency  Neg Hx   . Urticaria Neg Hx      Social History: Connee lives at home in a house.  There is tile and carpeting in the main living areas and carpeting in the bedroom.  They have heat pump for heating and cooling with supplemental electric heating and central cooling.  There are no animals inside or outside of the home.  There are no dust mite covers.  There is no tobacco exposure.  She smoked when she was in high school.  She is a retired Public house manager.  There is no exposure to fumes, chemicals, or dust.  She does use a HEPA filter in the home.  They do not live near an industrial area or an interstate.  Review of Systems  Constitutional: Negative.  Negative for chills, fever, malaise/fatigue and weight loss.  HENT: Positive for congestion and sinus pain. Negative for ear discharge, ear pain and nosebleeds.        Positive for postnasal drip.  Eyes: Negative for pain, discharge and redness.  Respiratory: Positive for cough and shortness of breath. Negative for sputum production and wheezing.   Cardiovascular: Negative.  Negative for chest pain and palpitations.  Gastrointestinal: Negative for abdominal pain, constipation, diarrhea, heartburn, nausea and vomiting.  Skin: Negative.  Negative for itching and rash.  Neurological: Negative for dizziness and headaches.  Endo/Heme/Allergies: Negative for environmental allergies. Does not bruise/bleed easily.       Objective:   Blood pressure (!) 142/80, pulse 83, temperature (!) 96.3 F (35.7 C), temperature source Temporal, resp. rate 18, height 5' 4.9" (1.648 m), weight 160 lb 12.8 oz (72.9 kg), SpO2 98 %. Body mass index is 26.84 kg/m.   Physical Exam:   Physical Exam  Constitutional: She appears well-developed.  HENT:  Head: Normocephalic and atraumatic.  Right Ear: Tympanic membrane, external ear and ear canal normal. No drainage, swelling or tenderness. Tympanic membrane is not injected, not scarred, not erythematous, not retracted and not  bulging.  Left Ear: Tympanic membrane, external ear and ear canal normal. No drainage, swelling or tenderness. Tympanic membrane is not injected, not scarred, not erythematous, not retracted and not bulging.  Nose: Mucosal edema and rhinorrhea present. No nasal deformity or septal deviation. No epistaxis. Right sinus exhibits no maxillary sinus tenderness and no frontal sinus tenderness. Left sinus exhibits no maxillary sinus tenderness and no frontal sinus tenderness.  Mouth/Throat: Uvula is midline and oropharynx is clear and  moist. Mucous membranes are not pale and not dry.  Clear rhinorrhea from the bilateral nares.  Eyes: Pupils are equal, round, and reactive to light. Conjunctivae and EOM are normal. Right eye exhibits no chemosis and no discharge. Left eye exhibits no chemosis and no discharge. Right conjunctiva is not injected. Left conjunctiva is not injected.  Cardiovascular: Normal rate, regular rhythm and normal heart sounds.  Respiratory: Effort normal and breath sounds normal. No accessory muscle usage. No tachypnea. No respiratory distress. She has no wheezes. She has no rhonchi. She has no rales. She exhibits no tenderness.  Moving air well in all lung fields.  No increased work of breathing.  No wheezing appreciated.  Occasionally she will take some short, shallow breaths.  All of her breathing issues seem more connected to her rhinitis versus an intrinsic lung issue.   GI: There is no abdominal tenderness. There is no rebound and no guarding.  Lymphadenopathy:       Head (right side): No submandibular, no tonsillar and no occipital adenopathy present.       Head (left side): No submandibular, no tonsillar and no occipital adenopathy present.    She has no cervical adenopathy.  Neurological: She is alert.  Skin: No abrasion, no petechiae and no rash noted. Rash is not papular, not vesicular and not urticarial. No erythema. No pallor.  Psychiatric: She has a normal mood and affect.      Diagnostic studies:    Spirometry: results abnormal (FEV1: 0.60/25%, FVC: 0.87/28%, FEV1/FVC: 69%).    Spirometry consistent with possible restrictive disease. Xopenex four puffs via MDI treatment given in clinic with significant improvement in FEV1 and FVC per ATS criteria. Overall, her technique was not stellar but she did improve with the Xopenex treatment. Her FEV1 increased from 0.60 to 0.87 while her FVC increased from 0.87 to 1.39.  Allergy Studies:    Airborne Adult Perc - 11/28/19 0940    Time Antigen Placed  0940    Allergen Manufacturer  Waynette ButteryGreer    Location  Back    Number of Test  59    Panel 1  Select    1. Control-Buffer 50% Glycerol  Negative    2. Control-Histamine 1 mg/ml  2+    3. Albumin saline  Negative    4. Bahia  Negative    5. [place]  Negative    6. Johnson  Negative    7. Kentucky Blue  Negative    8. Meadow Fescue  Negative    9. Perennial Rye  Negative    10. Sweet Vernal  Negative    11. Timothy  Negative    12. Cocklebur  Negative    13. Burweed Marshelder  Negative    14. Ragweed, short  Negative    15. Ragweed, Giant  Negative    16. Plantain,  English  Negative    17. Lamb's Quarters  Negative    18. Sheep Sorrell  Negative    19. Rough Pigweed  Negative    20. Marsh Elder, Rough  Negative    21. Mugwort, Common  Negative    22. Ash mix  Negative    23. Birch mix  Negative    24. Beech American  Negative    25. Box, Elder  Negative    26. Cedar, red  Negative    27. Cottonwood, Guinea[place]Biss[place]astern  Negative    28. Elm mix  Negative    29. Hickory  Negative    30.  Maple mix  Negative    31. Oak, [place] mix  Negative    32. Pecan Pollen  Negative    33. Pine mix  Negative    34. Sycamore Eastern  Negative    35. Walnut, Black Pollen  Negative    36. Alternaria alternata  Negative    37. Cladosporium Herbarum  Negative    38. Aspergillus mix  Negative    39. Penicillium mix  Negative    40. Bipolaris sorokiniana (Helminthosporium)  Negative      41. Drechslera spicifera (Curvularia)  Negative    42. Mucor plumbeus  Negative    43. Fusarium moniliforme  Negative    44. Aureobasidium pullulans (pullulara)  Negative    45. Rhizopus oryzae  Negative    46. Botrytis cinera  Negative    47. Epicoccum nigrum  Negative    48. Phoma betae  Negative    49. Candida Albicans  Negative    50. Trichophyton mentagrophytes  Negative    51. Mite, D Farinae  5,000 AU/ml  Negative    52. Mite, D Pteronyssinus  5,000 AU/ml  Negative    53. Cat Hair 10,000 BAU/ml  Negative    54.  Dog Epithelia  Negative    55. Mixed Feathers  Negative    56. Horse Epithelia  Negative    57. Cockroach, German  Negative    58. Mouse  Negative    59. Tobacco Leaf  Negative     Intradermal - 11/28/19 1315    Time Antigen Placed  1316    Allergen Manufacturer  Waynette Buttery    Location  Back    Number of Test  15    Intradermal  Select    Control  Negative    [place]  1+    Johnson  2+    7 Grass  1+    Ragweed mix  Negative    Weed mix  Negative    Tree mix  1+    Mold 1  Negative    Mold 2  2+    Mold 3  Negative    Mold 4  2+    Cat  Negative    Dog  Negative    Cockroach  Negative    Mite mix  Negative       Allergy testing results were read and interpreted by myself, documented by clinical staff.         Malachi Bonds, MD [facility]

## 2019-11-28 NOTE — Patient Instructions (Addendum)
1. Chronic rhinitis - Testing today showed: grasses, trees and indoor molds - Copy of test results provided.  - Avoidance measures provided. - Stop taking: your current nose spray - Continue with: Zyrtec (cetirizine) 10mg  tablet once daily - Start taking: Flonase (fluticasone) one spray per nostril daily and Astelin (azelastine) 2 sprays per nostril 1-2 times daily as needed - You can use an extra dose of the antihistamine, if needed, for breakthrough symptoms.  - Consider nasal saline rinses 1-2 times daily to remove allergens from the nasal cavities as well as help with mucous clearance (this is especially helpful to do before the nasal sprays are given) - Consider allergy shots as a means of long-term control. - Allergy shots "re-train" and "reset" the immune system to ignore environmental allergens and decrease the resulting immune response to those allergens (sneezing, itchy watery eyes, runny nose, nasal congestion, etc).    - Allergy shots improve symptoms in 75-85% of patients.  - We can discuss more at the next appointment if the medications are not working for you.  2. Mild persistent asthma, uncomplicated - Lung testing was iffy, but I think this might be related to postnasal drip. - We are going to go ahead and start a daily controller medication (Breo).  - This will help control inflammation within the lungs and help with the coughing. - Spacer sample and demonstration provided. - Daily controller medication(s): Breo 100/34mcg one puff once daily - Prior to physical activity: albuterol 2 puffs 10-15 minutes before physical activity. - Rescue medications: albuterol 4 puffs every 4-6 hours as needed - Asthma control goals:  * Full participation in all desired activities (may need albuterol before activity) * Albuterol use two time or less a week on average (not counting use with activity) * Cough interfering with sleep two time or less a month * Oral steroids no more than once a  year * No hospitalizations  3. Return in about 6 weeks (around 01/09/2020). This can be an in-person, a virtual Webex or a telephone follow up visit.   Please inform 03/11/2020 of any Emergency Department visits, hospitalizations, or changes in symptoms. Call us before going to the ED for breathing or allergy symptoms since we might be able to fit you in for a sick visit. Feel free to contact us anytime with any questions, problems, or concerns.  It was a pleasure to meet you today!  Websites that have reliable patient information: 1. American Academy of Asthma, Allergy, and Immunology: www.aaaai.org 2. Food Allergy Research and Education (FARE): foodallergy.org 3. Mothers of Asthmatics: http://www.asthmacommunitynetwork.org 4. American College of Allergy, Asthma, and Immunology: www.acaai.org   COVID-19 Vaccine Information can be found at: [place] For questions related to vaccine distribution or appointments, please email [email or web address] or call [phone].     "Like" [phone] on Facebook and Instagram for our latest updates!       HAPPY SPRING!  Make sure you are registered to vote! If you have moved or changed any of your contact information, you will need to get this updated before voting!  In some cases, you MAY be able to register to vote online: [place]    Reducing Pollen Exposure  The American Academy of Allergy, Asthma and Immunology suggests the following steps to reduce your exposure to pollen during allergy seasons.    1. Do not hang sheets or clothing out to dry; pollen may collect on these items. 2. Do not mow lawns or spend time around freshly cut grass; mowing stirs  up pollen. 3. Keep windows closed at night.  Keep car windows closed while driving. 4. Minimize morning activities outdoors, a time when pollen counts are usually at their highest. 5. Stay  indoors as much as possible when pollen counts or humidity is high and on windy days when pollen tends to remain in the air longer. 6. Use air conditioning when possible.  Many air conditioners have filters that trap the pollen spores. 7. Use a HEPA room air filter to remove pollen form the indoor air you breathe.  Control of Mold Allergen   Mold and fungi can grow on a variety of surfaces provided certain temperature and moisture conditions exist.  Outdoor molds grow on plants, decaying vegetation and soil.  The major outdoor mold, Alternaria and Cladosporium, are found in very high numbers during hot and dry conditions.  Generally, a late Summer - Fall peak is seen for common outdoor fungal spores.  Rain will temporarily lower outdoor mold spore count, but counts rise rapidly when the rainy period ends.  The most important indoor molds are Aspergillus and Penicillium.  Dark, humid and poorly ventilated basements are ideal sites for mold growth.  The next most common sites of mold growth are the bathroom and the kitchen.   Indoor (Perennial) Mold Control   Positive indoor molds via skin testing: Aspergillus, Penicillium, Fusarium, Aureobasidium (Pullulara) and Rhizopus  1. Maintain humidity below 50%. 2. Clean washable surfaces with 5% bleach solution. 3. Remove sources e.g. contaminated carpets.    Allergy Shots   Allergies are the result of a chain reaction that starts in the immune system. Your immune system controls how your body defends itself. For instance, if you have an allergy to pollen, your immune system identifies pollen as an invader or allergen. Your immune system overreacts by producing antibodies called Immunoglobulin E (IgE). These antibodies travel to cells that release chemicals, causing an allergic reaction.  The concept behind allergy immunotherapy, whether it is received in the form of shots or tablets, is that the immune system can be desensitized to specific allergens  that trigger allergy symptoms. Although it requires time and patience, the payback can be long-term relief.  How Do Allergy Shots Work?  Allergy shots work much like a vaccine. Your body responds to injected amounts of a particular allergen given in increasing doses, eventually developing a resistance and tolerance to it. Allergy shots can lead to decreased, minimal or no allergy symptoms.  There generally are two phases: build-up and maintenance. Build-up often ranges from three to six months and involves receiving injections with increasing amounts of the allergens. The shots are typically given once or twice a week, though more rapid build-up schedules are sometimes used.  The maintenance phase begins when the most effective dose is reached. This dose is different for each person, depending on how allergic you are and your response to the build-up injections. Once the maintenance dose is reached, there are longer periods between injections, typically two to four weeks.  Occasionally doctors give cortisone-type shots that can temporarily reduce allergy symptoms. These types of shots are different and should not be confused with allergy immunotherapy shots.  Who Can Be Treated with Allergy Shots?  Allergy shots may be a good treatment approach for people with allergic rhinitis (hay fever), allergic asthma, conjunctivitis (eye allergy) or stinging insect allergy.   Before deciding to begin allergy shots, you should consider:  . The length of allergy season and the severity of your symptoms . Whether  medications and/or changes to your environment can control your symptoms . Your desire to avoid long-term medication use . Time: allergy immunotherapy requires a major time commitment . Cost: may vary depending on your insurance coverage  Allergy shots for children age 24 and older are effective and often well tolerated. They might prevent the onset of new allergen sensitivities or the  progression to asthma.  Allergy shots are not started on patients who are pregnant but can be continued on patients who become pregnant while receiving them. In some patients with other medical conditions or who take certain common medications, allergy shots may be of risk. It is important to mention other medications you talk to your allergist.   When Will I Feel Better?  Some may experience decreased allergy symptoms during the build-up phase. For others, it may take as long as 12 months on the maintenance dose. If there is no improvement after a year of maintenance, your allergist will discuss other treatment options with you.  If you aren't responding to allergy shots, it may be because there is not enough dose of the allergen in your vaccine or there are missing allergens that were not identified during your allergy testing. Other reasons could be that there are high levels of the allergen in your environment or major exposure to non-allergic triggers like tobacco smoke.  What Is the Length of Treatment?  Once the maintenance dose is reached, allergy shots are generally continued for three to five years. The decision to stop should be discussed with your allergist at that time. Some people may experience a permanent reduction of allergy symptoms. Others may relapse and a longer course of allergy shots can be considered.  What Are the Possible Reactions?  The two types of adverse reactions that can occur with allergy shots are local and systemic. Common local reactions include very mild redness and swelling at the injection site, which can happen immediately or several hours after. A systemic reaction, which is less common, affects the entire body or a particular body system. They are usually mild and typically respond quickly to medications. Signs include increased allergy symptoms such as sneezing, a stuffy nose or hives.  Rarely, a serious systemic reaction called anaphylaxis can develop.  Symptoms include swelling in the throat, wheezing, a feeling of tightness in the chest, nausea or dizziness. Most serious systemic reactions develop within 30 minutes of allergy shots. This is why it is strongly recommended you wait in your doctor's office for 30 minutes after your injections. Your allergist is trained to watch for reactions, and his or her staff is trained and equipped with the proper medications to identify and treat them.  Who Should Administer Allergy Shots?  The preferred location for receiving shots is your prescribing allergist's office. Injections can sometimes be given at another facility where the physician and staff are trained to recognize and treat reactions, and have received instructions by your prescribing allergist.

## 2020-01-08 ENCOUNTER — Encounter: Payer: Self-pay | Admitting: Family Medicine

## 2020-01-08 ENCOUNTER — Other Ambulatory Visit: Payer: Self-pay

## 2020-01-08 ENCOUNTER — Ambulatory Visit (INDEPENDENT_AMBULATORY_CARE_PROVIDER_SITE_OTHER): Payer: Medicare HMO | Admitting: Family Medicine

## 2020-01-08 VITALS — BP 130/79 | HR 79 | Temp 98.1°F | Ht 64.9 in | Wt 160.4 lb

## 2020-01-08 DIAGNOSIS — J301 Allergic rhinitis due to pollen: Secondary | ICD-10-CM | POA: Diagnosis not present

## 2020-01-08 DIAGNOSIS — E782 Mixed hyperlipidemia: Secondary | ICD-10-CM

## 2020-01-08 DIAGNOSIS — R233 Spontaneous ecchymoses: Secondary | ICD-10-CM

## 2020-01-08 DIAGNOSIS — F411 Generalized anxiety disorder: Secondary | ICD-10-CM

## 2020-01-08 DIAGNOSIS — I10 Essential (primary) hypertension: Secondary | ICD-10-CM

## 2020-01-08 DIAGNOSIS — Z1211 Encounter for screening for malignant neoplasm of colon: Secondary | ICD-10-CM | POA: Diagnosis not present

## 2020-01-08 DIAGNOSIS — R69 Illness, unspecified: Secondary | ICD-10-CM | POA: Diagnosis not present

## 2020-01-08 DIAGNOSIS — R238 Other skin changes: Secondary | ICD-10-CM | POA: Diagnosis not present

## 2020-01-08 DIAGNOSIS — Z79899 Other long term (current) drug therapy: Secondary | ICD-10-CM | POA: Diagnosis not present

## 2020-01-08 LAB — CBC WITH DIFFERENTIAL/PLATELET
Basophils Absolute: 0.1 10*3/uL (ref 0.0–0.2)
Basos: 1 %
EOS (ABSOLUTE): 0.1 10*3/uL (ref 0.0–0.4)
Eos: 2 %
Hematocrit: 39.3 % (ref 34.0–46.6)
Hemoglobin: 13.6 g/dL (ref 11.1–15.9)
Immature Grans (Abs): 0 10*3/uL (ref 0.0–0.1)
Immature Granulocytes: 0 %
Lymphocytes Absolute: 1.4 10*3/uL (ref 0.7–3.1)
Lymphs: 22 %
MCH: 31.1 pg (ref 26.6–33.0)
MCHC: 34.6 g/dL (ref 31.5–35.7)
MCV: 90 fL (ref 79–97)
Monocytes Absolute: 0.5 10*3/uL (ref 0.1–0.9)
Monocytes: 9 %
Neutrophils Absolute: 4 10*3/uL (ref 1.4–7.0)
Neutrophils: 66 %
Platelets: 213 10*3/uL (ref 150–450)
RBC: 4.37 x10E6/uL (ref 3.77–5.28)
RDW: 11.8 % (ref 11.7–15.4)
WBC: 6.2 10*3/uL (ref 3.4–10.8)

## 2020-01-08 LAB — LIPID PANEL
Chol/HDL Ratio: 2.9 ratio (ref 0.0–4.4)
Cholesterol, Total: 171 mg/dL (ref 100–199)
HDL: 58 mg/dL (ref >39)
LDL Chol Calc (NIH): 85 mg/dL (ref 0–99)
Triglycerides: 162 mg/dL — ABNORMAL HIGH (ref 0–149)
VLDL Cholesterol Cal: 28 mg/dL (ref 5–40)

## 2020-01-08 MED ORDER — ALPRAZOLAM 0.5 MG PO TABS: 0.5000 mg | ORAL_TABLET | Freq: Every day | ORAL | 0 refills | Status: DC | PRN

## 2020-01-08 MED ORDER — ALBUTEROL SULFATE HFA 108 (90 BASE) MCG/ACT IN AERS: INHALATION_SPRAY | RESPIRATORY_TRACT | 0 refills | Status: DC

## 2020-01-08 MED ORDER — LEVOCETIRIZINE DIHYDROCHLORIDE 5 MG PO TABS: 5.0000 mg | ORAL_TABLET | Freq: Every evening | ORAL | 3 refills | Status: DC

## 2020-01-08 NOTE — Progress Notes (Signed)
Subjective: CC: Follow-up hypertension, fasting labs PCP: Raliegh Ip, DO HYW:VPXTG Misty Clark is a 69 y.o. female presenting to clinic today for:  1.  Hypertension with hyperlipidemia/allergies Patient reports compliance with her Maxide 25, Lipitor 40 mg daily.  No chest pain.  No shortness of breath but does need a renewal on her albuterol to have on hand just in case.  She is compliant with her Breo, Flonase.  She stopped using the Astelin that was prescribed because it causes too much nasal irritation.  She would like to see if the Xyzal can be prescribed.  2. GAD w/ panic Patient reports compliance with the Celexa.  She has rare use of the alprazolam.  Denies any changes in mentation, falls, breathing problems.  3.  Easy bruisability Patient has ongoing easy bruising and mildly long bleeding with injuries.  However she is able to stop the bleeding.  She never spontaneously bruises or bleeds.  Does not report any hematochezia, melena.  She never received the Cologuard.  ROS: Per HPI  Allergies  Allergen Reactions  . Codeine     REACTION: vomiting  . Sulfonamide Derivatives     REACTION: rash   Past Medical History:  Diagnosis Date  . Allergy   . Anxiety   . Arthritis   . Hypertension   . IBS (irritable bowel syndrome)    Mixed  . Multiple allergies   . Recurrent upper respiratory infection (URI)     Current Outpatient Medications:  .  albuterol (VENTOLIN HFA) 108 (90 Base) MCG/ACT inhaler, INHALE 2 PUFFS BY MOUTH EVERY 6 HOURS AS NEEDED FOR WHEEZING FOR SHORTNESS OF BREATH, Disp: 9 g, Rfl: 0 .  ALPRAZolam (XANAX) 0.5 MG tablet, Take 1 tablet (0.5 mg total) by mouth daily as needed for anxiety., Disp: 20 tablet, Rfl: 0 .  atorvastatin (LIPITOR) 40 MG tablet, Take 1 tablet (40 mg total) by mouth daily., Disp: 90 tablet, Rfl: 3 .  azelastine (ASTELIN) 0.1 % nasal spray, Place 2 sprays into both nostrils 2 (two) times daily as needed for rhinitis. Use as directed.,  Disp: 30 mL, Rfl: 5 .  cetirizine (ZYRTEC) 10 MG chewable tablet, Chew 10 mg by mouth daily., Disp: , Rfl:  .  citalopram (CELEXA) 20 MG tablet, Take 1 tablet (20 mg total) by mouth daily., Disp: 90 tablet, Rfl: 3 .  fluticasone (FLONASE) 50 MCG/ACT nasal spray, Place 1 spray into both nostrils daily as needed for allergies or rhinitis., Disp: 1 g, Rfl: 5 .  fluticasone furoate-vilanterol (BREO ELLIPTA) 100-25 MCG/INH AEPB, Inhale 1 puff into the lungs daily at 6 (six) AM., Disp: 60 each, Rfl: 2 .  ipratropium (ATROVENT) 0.06 % nasal spray, 2 sprays by Each Nare route Three (3) times a day., Disp: 45 mL, Rfl: 0 .  mometasone (ELOCON) 0.1 % cream, Apply 1 application topically daily. To affected areas, Disp: 45 g, Rfl: 0 .  triamterene-hydrochlorothiazide (MAXZIDE-25) 37.5-25 MG tablet, Take 1 tablet by mouth daily., Disp: 90 tablet, Rfl: 3 Social History   Socioeconomic History  . Marital status: Widowed    Spouse name: Not on file  . Number of children: Not on file  . Years of education: Not on file  . Highest education level: Not on file  Occupational History  . Occupation: Retired Nursing    Comment: Engineer, [employer]  Tobacco Use  . Smoking status: Former Smoker    Packs/day: 0.50    Years: 3.00    Pack years: 1.50  .  Smokeless tobacco: Never Used  . Tobacco comment: Quit many years ago.  Vaping Use  . Vaping Use: Never used  Substance and Sexual Activity  . Alcohol use: No  . Drug use: Never  . Sexual activity: Not on file  Other Topics Concern  . Not on file  Social History Narrative  . Not on file   Social Determinants of Health   Financial Resource Strain:   . Difficulty of Paying Living Expenses:   Food Insecurity:   . Worried About Programme researcher, broadcasting/film/video in the Last Year:   . Barista in the Last Year:   Transportation Needs:   . Freight forwarder (Medical):   Marland Kitchen Lack of Transportation (Non-Medical):   Physical Activity:   . Days of Exercise per  Week:   . Minutes of Exercise per Session:   Stress:   . Feeling of Stress :   Social Connections:   . Frequency of Communication with Friends and Family:   . Frequency of Social Gatherings with Friends and Family:   . Attends Religious Services:   . Active Member of Clubs or Organizations:   . Attends Banker Meetings:   Marland Kitchen Marital Status:   Intimate Partner Violence:   . Fear of Current or Ex-Partner:   . Emotionally Abused:   Marland Kitchen Physically Abused:   . Sexually Abused:    Family History  Problem Relation Age of Onset  . Heart failure Maternal Grandmother        CHF  . Breast cancer Sister   . Heart attack Paternal Grandfather        MI  . Coronary artery disease Paternal Grandfather        At a premature age  . Coronary artery disease Other   . Colon cancer Other   . Prostate cancer Other   . Ovarian cancer Other   . Asthma Mother   . Thyroid disease Mother   . Heart disease Father   . Hypertension Daughter   . Allergic rhinitis Neg Hx   . Angioedema Neg Hx   . Atopy Neg Hx   . Eczema Neg Hx   . Immunodeficiency Neg Hx   . Urticaria Neg Hx     Objective: Office vital signs reviewed. BP 130/79   Pulse 79   Temp 98.1 F (36.7 C)   Ht 5' 4.9" (1.648 m)   Wt 160 lb 6.4 oz (72.8 kg)   SpO2 96%   BMI 26.77 kg/m   Physical Examination:  General: Awake, alert, well nourished, No acute distress HEENT: Normal, sclera white, MMM Cardio: regular rate and rhythm, S1S2 heard, no murmurs appreciated Pulm: clear to auscultation bilaterally, no wheezes, rhonchi or rales; normal work of breathing on room air Extremities: warm, well perfused, No edema, cyanosis or clubbing; +2 pulses bilaterally MSK: normal gait and station Psych: Mood stable, speech normal, affect appropriate, pleasant and interactive Depression screen [facility] 2/9 01/08/2020 01/08/2020 11/20/2019  Decreased Interest 0 0 0  Down, Depressed, Hopeless 0 0 0  PHQ - 2 Score 0 0 0  Altered sleeping 0 - 0   Tired, decreased energy 0 - 0  Change in appetite 0 - 0  Feeling bad or failure about yourself  0 - 0  Trouble concentrating 0 - 0  Moving slowly or fidgety/restless 0 - 0  Suicidal thoughts 0 - 0  PHQ-9 Score 0 - 0  Difficult doing work/chores - - -   GAD 7 :  Generalized Anxiety Score 01/08/2020 11/06/2018 07/03/2018 03/29/2018  Nervous, Anxious, on Edge 0 1 0 1  Control/stop worrying 0 3 0 1  Worry too much - different things 0 3 0 0  Trouble relaxing 0 1 0 0  Restless 0 0 0 0  Easily annoyed or irritable 0 1 0 0  Afraid - awful might happen 0 1 0 1  Total GAD 7 Score 0 10 0 3  Anxiety Difficulty - Somewhat difficult - Not difficult at all     Assessment/ Plan: 69 y.o. female   1. Essential hypertension, benign Controlled.  Continue current regimen  2. Mixed hyperlipidemia Check lipid panel.  Continue atorvastatin - Lipid Panel  3. Generalized anxiety disorder Stable.  Continue Celexa.  Alprazolam renewed but asked to place on hold as she does not need this yet - ToxASSURE Select 13 (MW), Urine  4. Controlled substance agreement signed Controlled substance contract and UDS completed.  National narcotic database was reviewed and there were no red flags - ToxASSURE Select 13 (MW), Urine  5. Easy bruisability Continues to have easy bruisability but this seems to be secondary to trauma.  We discussed the easy friability of vessels as we age due to thinness of skin.  Will check CBC with platelet - CBC with Differential  6. Seasonal allergic rhinitis due to pollen Stable.  Xyzal prescribed  7. Colon cancer screening We will attempt a third time to get Cologuard for this patient - Cologuard   Orders Placed This Encounter  Procedures  . ToxASSURE Select 13 (MW), Urine  . Lipid Panel  . CBC with Differential  . Cologuard   Meds ordered this encounter  Medications  . levocetirizine (XYZAL) 5 MG tablet    Sig: Take 1 tablet (5 mg total) by mouth every evening.     Dispense:  90 tablet    Refill:  3  . albuterol (VENTOLIN HFA) 108 (90 Base) MCG/ACT inhaler    Sig: INHALE 2 PUFFS BY MOUTH EVERY 6 HOURS AS NEEDED FOR WHEEZING FOR SHORTNESS OF BREATH    Dispense:  9 g    Refill:  0  . ALPRAZolam (XANAX) 0.5 MG tablet    Sig: Take 1 tablet (0.5 mg total) by mouth daily as needed for anxiety. Place on file. Doesn't need yet    Dispense:  20 tablet    Refill:  0     Sumer Moorehouse Hulen Skains, DO [facility] [phone]

## 2020-01-18 ENCOUNTER — Encounter: Payer: Self-pay | Admitting: Allergy & Immunology

## 2020-01-18 ENCOUNTER — Other Ambulatory Visit: Payer: Self-pay

## 2020-01-18 ENCOUNTER — Ambulatory Visit (INDEPENDENT_AMBULATORY_CARE_PROVIDER_SITE_OTHER): Payer: Medicare HMO | Admitting: Allergy & Immunology

## 2020-01-18 VITALS — BP 128/74 | HR 82 | Resp 16

## 2020-01-18 DIAGNOSIS — J3089 Other allergic rhinitis: Secondary | ICD-10-CM | POA: Diagnosis not present

## 2020-01-18 DIAGNOSIS — J4530 Mild persistent asthma, uncomplicated: Secondary | ICD-10-CM | POA: Diagnosis not present

## 2020-01-18 DIAGNOSIS — J302 Other seasonal allergic rhinitis: Secondary | ICD-10-CM

## 2020-01-18 MED ORDER — IPRATROPIUM BROMIDE 0.06 % NA SOLN: NASAL | 5 refills | Status: DC

## 2020-01-18 NOTE — Patient Instructions (Addendum)
1. Chronic rhinitis (grasses, trees and indoor molds) - Continue with  Zyrtec (cetirizine) 10mg  tablet once daily and Flonase (fluticasone) one spray per nostril daily  - Add on nasal ipratropium every 8 hours as needed to help with drying out your nose (this can over dry your nose).  - Continue with nasal saline rinses 1-2 times daily to remove allergens from the nasal cavities as well as help with mucous clearance (this is especially helpful to do before the nasal sprays are given)  - Consider allergy shots as a means of long-term control.  2. Mild persistent asthma, uncomplicated - Lung testing was still terrible.  - We are going to start you on a prednisone burst to see if this can reverse your lung function. - Daily controller medication(s): Breo 100/58mcg one puff once daily - Prior to physical activity: albuterol 2 puffs 10-15 minutes before physical activity. - Rescue medications: albuterol 4 puffs every 4-6 hours as needed - Asthma control goals:  * Full participation in all desired activities (may need albuterol before activity) * Albuterol use two time or less a week on average (not counting use with activity) * Cough interfering with sleep two time or less a month * Oral steroids no more than once a year * No hospitalizations  3. Return in about 2 weeks (around 02/01/2020). This can be an in-person, a virtual Webex or a telephone follow up visit.   Please inform 02/03/2020 of any Emergency Department visits, hospitalizations, or changes in symptoms. Call us before going to the ED for breathing or allergy symptoms since we might be able to fit you in for a sick visit. Feel free to contact us anytime with any questions, problems, or concerns.  It was a pleasure to see you again today!  Websites that have reliable patient information: 1. American Academy of Asthma, Allergy, and Immunology: www.aaaai.org 2. Food Allergy Research and Education (FARE): foodallergy.org 3. Mothers of  Asthmatics: http://www.asthmacommunitynetwork.org 4. American College of Allergy, Asthma, and Immunology: www.acaai.org   COVID-19 Vaccine Information can be found at: [place] For questions related to vaccine distribution or appointments, please email [email or web address] or call [phone].     "Like" [phone] on Facebook and Instagram for our latest updates!        Make sure you are registered to vote! If you have moved or changed any of your contact information, you will need to get this updated before voting!  In some cases, you MAY be able to register to vote online: [place]

## 2020-01-18 NOTE — Progress Notes (Signed)
FOLLOW UP  Date of Service/Encounter:  01/18/20   Assessment:   Mild persistent asthma, uncomplicated  Seasonal and perennial allergic rhinitis (grasses, trees and indoor molds)  Plan/Recommendations:   1. Chronic rhinitis (grasses, trees and indoor molds) - Continue with  Zyrtec (cetirizine) 10mg  tablet once daily and Flonase (fluticasone) one spray per nostril daily  - Add on nasal ipratropium every 8 hours as needed to help with drying out your nose (this can over dry your nose).  - Continue with nasal saline rinses 1-2 times daily to remove allergens from the nasal cavities as well as help with mucous clearance (this is especially helpful to do before the nasal sprays are given)  - Consider allergy shots as a means of long-term control.  2. Mild persistent asthma, uncomplicated - Lung testing was still terrible.  - We are going to start you on a prednisone burst to see if this can reverse your lung function. - Daily controller medication(s): Breo 100/3mcg one puff once daily - Prior to physical activity: albuterol 2 puffs 10-15 minutes before physical activity. - Rescue medications: albuterol 4 puffs every 4-6 hours as needed - Asthma control goals:  * Full participation in all desired activities (may need albuterol before activity) * Albuterol use two time or less a week on average (not counting use with activity) * Cough interfering with sleep two time or less a month * Oral steroids no more than once a year * No hospitalizations  3. Return in about 2 weeks (around 02/01/2020). This can be an in-person, a virtual Webex or a telephone follow up visit.  Subjective:   Misty Clark is a 69 y.o. female presenting today for follow up of  Chief Complaint  Patient presents with  . Asthma    has noticed her allergies have flared over the last couple of weeks. definitely allergy related. her asthma has improved though.     Misty Clark has a history of the  following: Patient Active Problem List   Diagnosis Date Noted  . Mixed hyperlipidemia 12/26/2017  . Seasonal allergic rhinitis due to pollen 11/18/2017  . Generalized anxiety disorder 06/03/2008  . Essential hypertension, benign 06/03/2008  . GERD 06/03/2008  . Irritable bowel syndrome 06/03/2008  . Nonspecific abnormal results of cardiovascular function study 06/03/2008    History obtained from: chart review and patient.  Misty Clark is a 69 y.o. female presenting for a follow up visit.  She was last seen in May 2021 as a new patient.  At that time, she had testing that was positive to grasses, trees, and indoor molds.  We stopped her current nose spray and started Flonase and Astelin.  We also continued Zyrtec 10 mg daily.  For her asthma, her testing looked terrible.  We started Breo 100/25 mcg 1 puff once daily with albuterol as needed.  She has a remote history of smoking when she was in high school, but she is exposed to secondhand smoke via her daughter.  Since last visit, she has been doing well.    Asthma/Respiratory Symptom History: Despite her lung function. she is doing very well. She feels that the June 2021 is helping with her breathing. Her albuterol use has been minimal. She has not been in the ED and has not needed prednisone. She does tell me that she always feels better with the prednisone, but it is difficult to get a sense of when she actually needs steroids.   Allergic Rhinitis Symptom History: She is doing  cetirizine in the morning and Xyzal in the evening. She stopped the Astelin since this was burning her nose. She is having a lot of postnasal drip, which she reports causes her wheezing to get worse.  Otherwise, there have been no changes to her past medical history, surgical history, family history, or social history.    Review of Systems  Constitutional: Negative.  Negative for chills, fever, malaise/fatigue and weight loss.  HENT: Positive for congestion. Negative for ear  discharge, ear pain and sinus pain.        Positive for postnasal drip. Positive for throat clearing.   Eyes: Negative for pain, discharge and redness.  Respiratory: Negative for cough, sputum production, shortness of breath and wheezing.   Cardiovascular: Negative.  Negative for chest pain and palpitations.  Gastrointestinal: Negative for abdominal pain, constipation, diarrhea, heartburn, nausea and vomiting.  Skin: Negative.  Negative for itching and rash.  Neurological: Negative for dizziness and headaches.  Endo/Heme/Allergies: Positive for environmental allergies. Does not bruise/bleed easily.       Objective:   Blood pressure 128/74, pulse 82, resp. rate 16, SpO2 97 %. There is no height or weight on file to calculate BMI.   Physical Exam:  Physical Exam Constitutional:      Appearance: She is well-developed.     Comments: Friendly and interactive.   HENT:     Head: Normocephalic and atraumatic.     Right Ear: Tympanic membrane, ear canal and external ear normal.     Left Ear: Tympanic membrane and ear canal normal.     Nose: No nasal deformity, septal deviation, mucosal edema or rhinorrhea.     Right Sinus: No maxillary sinus tenderness or frontal sinus tenderness.     Left Sinus: No maxillary sinus tenderness or frontal sinus tenderness.     Comments: Erythema and cobblestoning present.     Mouth/Throat:     Mouth: Mucous membranes are not pale and not dry.     Pharynx: Uvula midline.  Eyes:     General:        Right eye: No discharge.        Left eye: No discharge.     Conjunctiva/sclera: Conjunctivae normal.     Right eye: Right conjunctiva is not injected. No chemosis.    Left eye: Left conjunctiva is not injected. No chemosis.    Pupils: Pupils are equal, round, and reactive to light.  Cardiovascular:     Rate and Rhythm: Normal rate and regular rhythm.     Heart sounds: Normal heart sounds.  Pulmonary:     Effort: Pulmonary effort is normal. No tachypnea,  accessory muscle usage or respiratory distress.     Breath sounds: Normal breath sounds. No wheezing, rhonchi or rales.     Comments: No increased work of breathing. Moving air well in all lung fields.  Chest:     Chest wall: No tenderness.  Lymphadenopathy:     Cervical: No cervical adenopathy.  Skin:    Coloration: Skin is not pale.     Findings: No abrasion, erythema, petechiae or rash. Rash is not papular, urticarial or vesicular.  Neurological:     Mental Status: She is alert.      Diagnostic studies:    Spirometry: results abnormal (FEV1: 0.83/35%, FVC: 1.12/36%, FEV1/FVC: 74%).    Spirometry consistent with possible restrictive disease.   Allergy Studies: none      Malachi Bonds, MD  [facility]

## 2020-02-01 ENCOUNTER — Ambulatory Visit (INDEPENDENT_AMBULATORY_CARE_PROVIDER_SITE_OTHER): Payer: Medicare HMO | Admitting: Allergy & Immunology

## 2020-02-01 ENCOUNTER — Other Ambulatory Visit: Payer: Self-pay

## 2020-02-01 ENCOUNTER — Encounter: Payer: Self-pay | Admitting: Allergy & Immunology

## 2020-02-01 VITALS — BP 138/80 | HR 84 | Resp 18

## 2020-02-01 DIAGNOSIS — J3089 Other allergic rhinitis: Secondary | ICD-10-CM | POA: Diagnosis not present

## 2020-02-01 DIAGNOSIS — J302 Other seasonal allergic rhinitis: Secondary | ICD-10-CM

## 2020-02-01 DIAGNOSIS — J984 Other disorders of lung: Secondary | ICD-10-CM | POA: Diagnosis not present

## 2020-02-01 DIAGNOSIS — R059 Cough, unspecified: Secondary | ICD-10-CM

## 2020-02-01 DIAGNOSIS — J4540 Moderate persistent asthma, uncomplicated: Secondary | ICD-10-CM | POA: Diagnosis not present

## 2020-02-01 DIAGNOSIS — R05 Cough: Secondary | ICD-10-CM

## 2020-02-01 DIAGNOSIS — J4530 Mild persistent asthma, uncomplicated: Secondary | ICD-10-CM | POA: Diagnosis not present

## 2020-02-01 NOTE — Progress Notes (Signed)
FOLLOW UP  Date of Service/Encounter:  02/01/20   Assessment:   Mild persistent asthma, uncomplicated  Persistent restrictive lung disease - not responsive to prednisone  Seasonal and perennial allergic rhinitis(grasses, trees and indoor molds)   Ms. Vaquera presents for a follow up visit. She has not improved with the use of prednisone and she has minimally improved with Trelegy, at least from a spirometry aspect. Clinically, she does feel that she is doing better. She has no notable exposure aside from smoking as a teenager, at least from what I can gather. We are going to get a CXR today followed by a possible chest CT. I am also going to refer her to see Pulmonology for an evaluation.    Plan/Recommendations:    1. Chronic rhinitis (grasses, trees and indoor molds) - Continue with  Zyrtec (cetirizine) 10mg  tablet once daily and Flonase (fluticasone) one spray per nostril daily  - Continue with nasal ipratropium every 8 hours as needed to help with drying out your nose.  - Continue with nasal saline rinses 1-2 times daily to remove allergens from the nasal cavities as well as help with mucous clearance (this is especially helpful to do before the nasal sprays are given)  - Consider allergy shots as a means of long-term control, but I want to get your breathing under better control.   2. Mild persistent asthma, uncomplicated - Lung testing was still terrible.  - We are going to send you to see Pulmonology for an evaluation.  - We are also going to get a chest X-ray.  - Stop the Breo and start Trelegy one puff once daily (contains inhaled steroids, long acting albuterol, and another medicine to keep your lungs open).  - Daily controller medication(s): Trelegy 100/62.5/25 one puff once daily - Prior to physical activity: albuterol 2 puffs 10-15 minutes before physical activity. - Rescue medications: albuterol 4 puffs every 4-6 hours as needed - Asthma control goals:  * Full  participation in all desired activities (may need albuterol before activity) * Albuterol use two time or less a week on average (not counting use with activity) * Cough interfering with sleep two time or less a month * Oral steroids no more than once a year * No hospitalizations  3. Return in about 2 months (around 04/03/2020). This can be an in-person, a virtual Webex or a telephone follow up visit.   Subjective:   Oona Trammel is a 69 y.o. female presenting today for follow up of  Chief Complaint  Patient presents with  . Ear Problem    left ear feels like it may be a little full of fluid. some dizziness when she stands up. says her allergies have been pretty bad this week.   . Asthma    heat & humidity is not helping any at all. better than she was though.     Kymberlie Brazeau has a history of the following: Patient Active Problem List   Diagnosis Date Noted  . Mixed hyperlipidemia 12/26/2017  . Seasonal allergic rhinitis due to pollen 11/18/2017  . Generalized anxiety disorder 06/03/2008  . Essential hypertension, benign 06/03/2008  . GERD 06/03/2008  . Irritable bowel syndrome 06/03/2008  . Nonspecific abnormal results of cardiovascular function study 06/03/2008    History obtained from: chart review and patient.  Yee is a 69 y.o. female presenting for a follow up visit.  She was last seen in July 2021 around 2 weeks ago.  At that time, her  lung testing looked terrible.  We continued her on Breo 1 puff once daily and started her on a prednisone burst.  For her rhinitis, we continued Zyrtec and we added on nasal ipratropium to help with drying her nose.  She has a remote history of smoking when she was in high school, but she does have a history of secondhand smoke exposure.  Asthma/Respiratory Symptom History: Since the last visit, she has done better. She does trell me that the weather has been difficult. She remains on the Trelegy one puff once daily and she completed  her prednisone. She does think that she is doing better, despite what the   Allergic Rhinitis Symptom History: She remains on the cetirizine as well as fluticasone. She has not needed prednisone at all since the last visit. She has been using her nasal Atrovetnt every 8 hours.   Otherwise, there have been no changes to her past medical history, surgical history, family history, or social history.    Review of Systems  Constitutional: Negative.  Negative for chills, fever, malaise/fatigue and weight loss.  HENT: Negative for congestion, ear discharge, ear pain and sinus pain.   Eyes: Negative for pain, discharge and redness.  Respiratory: Positive for cough and shortness of breath. Negative for sputum production and wheezing.   Cardiovascular: Negative.  Negative for chest pain and palpitations.  Gastrointestinal: Negative for abdominal pain, constipation, diarrhea, heartburn, nausea and vomiting.  Skin: Negative.  Negative for itching and rash.  Neurological: Negative for dizziness and headaches.  Endo/Heme/Allergies: Positive for environmental allergies. Does not bruise/bleed easily.       Objective:   Blood pressure (!) 138/80, pulse 84, resp. rate 18, SpO2 97 %. There is no height or weight on file to calculate BMI.   Physical Exam:  Physical Exam Constitutional:      Appearance: She is well-developed.  HENT:     Head: Normocephalic and atraumatic.     Right Ear: Tympanic membrane, ear canal and external ear normal.     Left Ear: Tympanic membrane, ear canal and external ear normal.     Nose: No nasal deformity, septal deviation, mucosal edema or rhinorrhea.     Right Sinus: No maxillary sinus tenderness or frontal sinus tenderness.     Left Sinus: No maxillary sinus tenderness or frontal sinus tenderness.     Comments: Erythematous turbinates bilaterally.     Mouth/Throat:     Mouth: Mucous membranes are not pale and not dry.     Pharynx: Uvula midline.  Eyes:      General:        Right eye: No discharge.        Left eye: No discharge.     Conjunctiva/sclera: Conjunctivae normal.     Right eye: Right conjunctiva is not injected. No chemosis.    Left eye: Left conjunctiva is not injected. No chemosis.    Pupils: Pupils are equal, round, and reactive to light.  Cardiovascular:     Rate and Rhythm: Normal rate and regular rhythm.     Heart sounds: Normal heart sounds.  Pulmonary:     Effort: Pulmonary effort is normal. No tachypnea, accessory muscle usage or respiratory distress.     Breath sounds: Normal breath sounds. No wheezing, rhonchi or rales.     Comments: Moving air well in all lung fields. No increased work of breathing noted.  Chest:     Chest wall: No tenderness.  Lymphadenopathy:     Cervical: No cervical  adenopathy.  Skin:    Coloration: Skin is not pale.     Findings: No abrasion, erythema, petechiae or rash. Rash is not papular, urticarial or vesicular.  Neurological:     Mental Status: She is alert.  Psychiatric:        Behavior: Behavior is cooperative.       Diagnostic studies:    Spirometry: FEV and FVC are still in the 30% range with minimal improvement since the last visit.          Malachi Bonds, MD  [facility]

## 2020-02-01 NOTE — Patient Instructions (Addendum)
1. Chronic rhinitis (grasses, trees and indoor molds) - Continue with  Zyrtec (cetirizine) 10mg  tablet once daily and Flonase (fluticasone) one spray per nostril daily  - Continue with nasal ipratropium every 8 hours as needed to help with drying out your nose.  - Continue with nasal saline rinses 1-2 times daily to remove allergens from the nasal cavities as well as help with mucous clearance (this is especially helpful to do before the nasal sprays are given)  - Consider allergy shots as a means of long-term control, but I want to get your breathing under better control.   2. Mild persistent asthma, uncomplicated - Lung testing was still terrible.  - We are going to send you to see Pulmonology for an evaluation.  - We are also going to get a chest X-ray.  - Stop the Breo and start Trelegy one puff once daily (contains inhaled steroids, long acting albuterol, and another medicine to keep your lungs open).  - Daily controller medication(s): Trelegy 100/62.5/25 one puff once daily - Prior to physical activity: albuterol 2 puffs 10-15 minutes before physical activity. - Rescue medications: albuterol 4 puffs every 4-6 hours as needed - Asthma control goals:  * Full participation in all desired activities (may need albuterol before activity) * Albuterol use two time or less a week on average (not counting use with activity) * Cough interfering with sleep two time or less a month * Oral steroids no more than once a year * No hospitalizations  3. Return in about 2 months (around 04/03/2020). This can be an in-person, a virtual Webex or a telephone follow up visit.   Please inform 04/05/2020 of any Emergency Department visits, hospitalizations, or changes in symptoms. Call us before going to the ED for breathing or allergy symptoms since we might be able to fit you in for a sick visit. Feel free to contact us anytime with any questions, problems, or concerns.  It was a pleasure to see you again today! Be  safe when you travel!!! Please consider getting vaccinated!!!   Websites that have reliable patient information: 1. American Academy of Asthma, Allergy, and Immunology: www.aaaai.org 2. Food Allergy Research and Education (FARE): foodallergy.org 3. Mothers of Asthmatics: http://www.asthmacommunitynetwork.org 4. American College of Allergy, Asthma, and Immunology: www.acaai.org   COVID-19 Vaccine Information can be found at: [place] For questions related to vaccine distribution or appointments, please email [email or web address] or call [phone].     "Like" [phone] on Facebook and Instagram for our latest updates!        Make sure you are registered to vote! If you have moved or changed any of your contact information, you will need to get this updated before voting!  In some cases, you MAY be able to register to vote online: [place]

## 2020-02-03 ENCOUNTER — Encounter: Payer: Self-pay | Admitting: Allergy & Immunology

## 2020-02-03 MED ORDER — TRELEGY ELLIPTA 200-62.5-25 MCG/INH IN AEPB: 1.0000 | INHALATION_SPRAY | Freq: Every day | RESPIRATORY_TRACT | 5 refills | Status: DC

## 2020-02-05 ENCOUNTER — Telehealth: Payer: Self-pay

## 2020-02-05 NOTE — Telephone Encounter (Signed)
-----   Message from Alfonse Spruce, MD sent at 02/03/2020  7:26 AM EDT ----- Pulmonology referral placed.

## 2020-02-05 NOTE — Telephone Encounter (Signed)
04/03/2020    Time: 9:30 AM    Visit Type: CONSULT [1001]    Provider: Oretha Milch, MD      [address] [place] [address]  I left a message for the patient to call back so we can give her this information.  Thanks

## 2020-02-06 LAB — CBC WITH DIFFERENTIAL/PLATELET
Basophils Absolute: 0.1 10*3/uL (ref 0.0–0.2)
Basos: 1 %
EOS (ABSOLUTE): 0.1 10*3/uL (ref 0.0–0.4)
Eos: 1 %
Hematocrit: 38.6 % (ref 34.0–46.6)
Hemoglobin: 13.8 g/dL (ref 11.1–15.9)
Immature Grans (Abs): 0 10*3/uL (ref 0.0–0.1)
Immature Granulocytes: 0 %
Lymphocytes Absolute: 1.6 10*3/uL (ref 0.7–3.1)
Lymphs: 24 %
MCH: 30.9 pg (ref 26.6–33.0)
MCHC: 35.8 g/dL — ABNORMAL HIGH (ref 31.5–35.7)
MCV: 87 fL (ref 79–97)
Monocytes Absolute: 0.6 10*3/uL (ref 0.1–0.9)
Monocytes: 10 %
Neutrophils Absolute: 4.1 10*3/uL (ref 1.4–7.0)
Neutrophils: 64 %
Platelets: 213 10*3/uL (ref 150–450)
RBC: 4.46 x10E6/uL (ref 3.77–5.28)
RDW: 11.7 % (ref 11.7–15.4)
WBC: 6.5 10*3/uL (ref 3.4–10.8)

## 2020-02-06 LAB — ANCA TITERS
Atypical pANCA: <1:20 {titer}
C-ANCA: <1:20 {titer}
P-ANCA: <1:20 {titer}

## 2020-02-06 LAB — ASPERGILLUS PRECIPITINS
A.Fumigatus #1 Abs: NEGATIVE
Aspergillus Flavus Antibodies: NEGATIVE
Aspergillus Niger Antibodies: NEGATIVE
Aspergillus glaucus IgG: NEGATIVE
Aspergillus nidulans IgG: NEGATIVE
Aspergillus terreus IgG: NEGATIVE

## 2020-02-06 LAB — IGE: IgE (Immunoglobulin E), Serum: 6 IU/mL (ref 6–495)

## 2020-02-06 LAB — ALPHA-1-ANTITRYPSIN: A-1 Antitrypsin: 134 mg/dL (ref 101–187)

## 2020-02-06 NOTE — Telephone Encounter (Signed)
Excellent.  Thank you for taking care of that so quickly.  Malachi Bonds, MD [facility]

## 2020-03-05 ENCOUNTER — Telehealth: Payer: Self-pay | Admitting: Family Medicine

## 2020-03-05 NOTE — Telephone Encounter (Signed)
  Incoming Patient Call  03/05/2020  What symptoms do you have? Sinus infection sinus pressure drainage  How long have you been sick? 2 weeks  Have you been seen for this problem? NO  Pt asked for a message to be sent to her DR. I told pt the policy--that she needs a ov or televisit in order for a provider to send in a rx. I offered a televisit for tomorrow with MMM and she said NO she wanted a televisit with her doctor but Dr Nadine Counts is not available. She demanded a message be sent to her DR. Pt said that Dr Nadine Counts told her that if she ever needed her to just to call.   If your provider decides to give you a prescription, which pharmacy would you like for it to be sent to? Did not ask for it  Patient informed that this information will be sent to the clinical staff for review and that they should receive a follow up call.

## 2020-03-05 NOTE — Telephone Encounter (Signed)
Patient aware and she wants it

## 2020-03-05 NOTE — Telephone Encounter (Signed)
If she doesn't mind waiting until Friday, I can do a virtual visit at 930am that day.  Let gina know to schedule if she wants that slot.

## 2020-03-05 NOTE — Telephone Encounter (Signed)
Appt scheduled

## 2020-03-07 ENCOUNTER — Other Ambulatory Visit: Payer: Self-pay

## 2020-03-07 ENCOUNTER — Ambulatory Visit (INDEPENDENT_AMBULATORY_CARE_PROVIDER_SITE_OTHER): Payer: Medicare HMO | Admitting: Family Medicine

## 2020-03-07 DIAGNOSIS — J0101 Acute recurrent maxillary sinusitis: Secondary | ICD-10-CM | POA: Diagnosis not present

## 2020-03-07 MED ORDER — AZITHROMYCIN 250 MG PO TABS: ORAL_TABLET | ORAL | 0 refills | Status: DC

## 2020-03-07 MED ORDER — IPRATROPIUM BROMIDE 0.06 % NA SOLN: NASAL | 5 refills | Status: DC

## 2020-03-07 NOTE — Progress Notes (Signed)
Telephone visit  Subjective: CC: sinus infection PCP: Misty Ip, DO WNI:OEVOJ Misty Clark is a 69 y.o. female calls for telephone consult today. Patient provides verbal consent for consult held via phone.  Due to COVID-19 pandemic this visit was conducted virtually. This visit type was conducted due to national recommendations for restrictions regarding the COVID-19 Pandemic (e.g. social distancing, sheltering in place) in an effort to limit this patient's exposure and mitigate transmission in our community. All issues noted in this document were discussed and addressed.  A physical exam was not performed with this format.   Location of patient: home Location of provider: [facility] Others present for call: none  1. Sinus infection Patient reports sinus congestion.  She reports sinus pressure and left ear pain/ pressure. She is using Zyrtec and Xyzal, nasal saline, flonase, atrovent nasal spray.  Symptoms have been present for over 2 weeks now. No improvement with meds.  No new cough.  No fever.   ROS: Per HPI  Allergies  Allergen Reactions  . Codeine     REACTION: vomiting  . Sulfonamide Derivatives     REACTION: rash   Past Medical History:  Diagnosis Date  . Allergy   . Anxiety   . Arthritis   . Hypertension   . IBS (irritable bowel syndrome)    Mixed  . Multiple allergies   . Recurrent upper respiratory infection (URI)     Current Outpatient Medications:  .  albuterol (VENTOLIN HFA) 108 (90 Base) MCG/ACT inhaler, INHALE 2 PUFFS BY MOUTH EVERY 6 HOURS AS NEEDED FOR WHEEZING FOR SHORTNESS OF BREATH, Disp: 9 g, Rfl: 0 .  ALPRAZolam (XANAX) 0.5 MG tablet, Take 1 tablet (0.5 mg total) by mouth daily as needed for anxiety. Place on file. Doesn't need yet, Disp: 20 tablet, Rfl: 0 .  atorvastatin (LIPITOR) 40 MG tablet, Take 1 tablet (40 mg total) by mouth daily., Disp: 90 tablet, Rfl: 3 .  cetirizine (ZYRTEC) 10 MG chewable tablet, Chew 10 mg by mouth in the morning. , Disp:  , Rfl:  .  citalopram (CELEXA) 20 MG tablet, Take 1 tablet (20 mg total) by mouth daily., Disp: 90 tablet, Rfl: 3 .  fluticasone (FLONASE) 50 MCG/ACT nasal spray, Place 1 spray into both nostrils daily as needed for allergies or rhinitis., Disp: 1 g, Rfl: 5 .  fluticasone furoate-vilanterol (BREO ELLIPTA) 100-25 MCG/INH AEPB, Inhale 1 puff into the lungs daily at 6 (six) AM., Disp: 60 each, Rfl: 2 .  Fluticasone-Umeclidin-Vilant (TRELEGY ELLIPTA) 200-62.5-25 MCG/INH AEPB, Inhale 1 puff into the lungs daily., Disp: 30 each, Rfl: 5 .  ipratropium (ATROVENT) 0.06 % nasal spray, every 8 hours as needed to help with drying out your nose, Disp: 15 mL, Rfl: 5 .  levocetirizine (XYZAL) 5 MG tablet, Take 1 tablet (5 mg total) by mouth every evening., Disp: 90 tablet, Rfl: 3 .  triamterene-hydrochlorothiazide (MAXZIDE-25) 37.5-25 MG tablet, Take 1 tablet by mouth daily., Disp: 90 tablet, Rfl: 3  Assessment/ Plan: 69 y.o. female   1. Acute recurrent maxillary sinusitis Z-Pak sent to pharmacy.  Home care instructions reviewed.  Encourage Covid testing.  Follow-up as needed. - azithromycin (ZITHROMAX) 250 MG tablet; Take 2 tablets today, then take 1 tablet daily until gone.  Dispense: 6 tablet; Refill: 0   Start time: 9:34am End time: 9:46am  Total time spent on patient care (including telephone call/ virtual visit): 16 minutes  Misty Acero Hulen Skains, DO [facility] [phone]

## 2020-04-03 ENCOUNTER — Institutional Professional Consult (permissible substitution): Payer: Medicare HMO | Admitting: Pulmonary Disease

## 2020-04-11 ENCOUNTER — Ambulatory Visit: Payer: Medicare HMO | Admitting: Allergy & Immunology

## 2020-04-22 ENCOUNTER — Encounter: Payer: Self-pay | Admitting: Family Medicine

## 2020-04-22 ENCOUNTER — Ambulatory Visit (INDEPENDENT_AMBULATORY_CARE_PROVIDER_SITE_OTHER): Payer: Medicare HMO | Admitting: Family Medicine

## 2020-04-22 DIAGNOSIS — J0100 Acute maxillary sinusitis, unspecified: Secondary | ICD-10-CM | POA: Diagnosis not present

## 2020-04-22 MED ORDER — AMOXICILLIN-POT CLAVULANATE 875-125 MG PO TABS: 1.0000 | ORAL_TABLET | Freq: Two times a day (BID) | ORAL | 0 refills | Status: DC

## 2020-04-22 NOTE — Progress Notes (Signed)
Subjective:    Patient ID: Misty Clark, female    DOB: March 09, 1951, 69 y.o.   MRN: [id number]   HPI: Misty Clark is a 69 y.o. female presenting for Symptoms include congestion, facial pain, nasal congestion, non productive cough, post nasal drip and sinus pressure. There is no fever, chills, or sweats. Onset of symptoms was a few days ago, gradually worsening since that time. Was out in the cold and wind3-4 days ago. Left ear pressure. No affect on hearing.    Depression screen [facility] 2/9 01/08/2020 01/08/2020 11/20/2019 10/03/2019 11/06/2018  Decreased Interest 0 0 0 0 0  Down, Depressed, Hopeless 0 0 0 0 1  PHQ - 2 Score 0 0 0 0 1  Altered sleeping 0 - 0 0 -  Tired, decreased energy 0 - 0 0 -  Change in appetite 0 - 0 0 -  Feeling bad or failure about yourself  0 - 0 0 -  Trouble concentrating 0 - 0 0 -  Moving slowly or fidgety/restless 0 - 0 0 -  Suicidal thoughts 0 - 0 0 -  PHQ-9 Score 0 - 0 0 -  Difficult doing work/chores - - - - -     Relevant past medical, surgical, family and social history reviewed and updated as indicated.  Interim medical history since our last visit reviewed. Allergies and medications reviewed and updated.  ROS:  Review of Systems  Constitutional: Negative for activity change, appetite change, chills and fever.  HENT: Positive for congestion, postnasal drip, rhinorrhea and sinus pressure. Negative for ear discharge, ear pain, hearing loss, nosebleeds, sneezing and trouble swallowing.   Respiratory: Negative for chest tightness and shortness of breath.   Cardiovascular: Negative for chest pain and palpitations.  Skin: Negative for rash.     Social History   Tobacco Use  Smoking Status Former Smoker  . Packs/day: 0.50  . Years: 3.00  . Pack years: 1.50  Smokeless Tobacco Never Used  Tobacco Comment   Quit many years ago.       Objective:     Wt Readings from Last 3 Encounters:  01/08/20 160 lb 6.4 oz (72.8 kg)  11/28/19 160 lb  12.8 oz (72.9 kg)  11/20/19 162 lb (73.5 kg)     Exam deferred. Pt. Harboring due to COVID 19. Phone visit performed.   Assessment & Plan:   1. Acute maxillary sinusitis, recurrence not specified     Meds ordered this encounter  Medications  . amoxicillin-clavulanate (AUGMENTIN) 875-125 MG tablet    Sig: Take 1 tablet by mouth 2 (two) times daily. Take all of this medication    Dispense:  20 tablet    Refill:  0    No orders of the defined types were placed in this encounter.     Diagnoses and all orders for this visit:  Acute maxillary sinusitis, recurrence not specified  Other orders -     amoxicillin-clavulanate (AUGMENTIN) 875-125 MG tablet; Take 1 tablet by mouth 2 (two) times daily. Take all of this medication    Virtual Visit via telephone Note  I discussed the limitations, risks, security and privacy concerns of performing an evaluation and management service by telephone and the availability of in person appointments. The patient was identified with two identifiers. Pt.expressed understanding and agreed to proceed. Pt. Is at home. Dr. Darlyn Read is in his office.  Follow Up Instructions:   I discussed the assessment and treatment plan with the patient. The patient  was provided an opportunity to ask questions and all were answered. The patient agreed with the plan and demonstrated an understanding of the instructions.   The patient was advised to call back or seek an in-person evaluation if the symptoms worsen or if the condition fails to improve as anticipated.   Total minutes including chart review and phone contact time: 8   Follow up plan: Return if symptoms worsen or fail to improve.  Mechele Claude, MD [facility]

## 2020-04-30 ENCOUNTER — Ambulatory Visit: Payer: Medicare HMO | Admitting: Allergy & Immunology

## 2020-05-06 ENCOUNTER — Other Ambulatory Visit: Payer: Self-pay | Admitting: Family Medicine

## 2020-05-14 ENCOUNTER — Ambulatory Visit (INDEPENDENT_AMBULATORY_CARE_PROVIDER_SITE_OTHER): Payer: Medicare HMO | Admitting: Allergy & Immunology

## 2020-05-14 ENCOUNTER — Encounter: Payer: Self-pay | Admitting: Allergy & Immunology

## 2020-05-14 ENCOUNTER — Other Ambulatory Visit: Payer: Self-pay

## 2020-05-14 VITALS — BP 148/60 | HR 62 | Temp 98.2°F | Resp 18 | Ht 65.5 in | Wt 160.0 lb

## 2020-05-14 DIAGNOSIS — J3089 Other allergic rhinitis: Secondary | ICD-10-CM | POA: Diagnosis not present

## 2020-05-14 DIAGNOSIS — R059 Cough, unspecified: Secondary | ICD-10-CM | POA: Diagnosis not present

## 2020-05-14 DIAGNOSIS — J302 Other seasonal allergic rhinitis: Secondary | ICD-10-CM | POA: Diagnosis not present

## 2020-05-14 DIAGNOSIS — J449 Chronic obstructive pulmonary disease, unspecified: Secondary | ICD-10-CM

## 2020-05-14 DIAGNOSIS — J4489 Other specified chronic obstructive pulmonary disease: Secondary | ICD-10-CM

## 2020-05-14 HISTORY — DX: Chronic obstructive pulmonary disease, unspecified: J44.9

## 2020-05-14 HISTORY — DX: Other specified chronic obstructive pulmonary disease: J44.89

## 2020-05-14 MED ORDER — IPRATROPIUM BROMIDE 0.06 % NA SOLN
NASAL | 5 refills | Status: DC
Start: 2020-05-14 — End: 2021-03-03

## 2020-05-14 MED ORDER — ALBUTEROL SULFATE HFA 108 (90 BASE) MCG/ACT IN AERS: 2.0000 | INHALATION_SPRAY | RESPIRATORY_TRACT | 1 refills | Status: DC | PRN

## 2020-05-14 MED ORDER — TRELEGY ELLIPTA 200-62.5-25 MCG/INH IN AEPB
1.0000 | INHALATION_SPRAY | Freq: Every day | RESPIRATORY_TRACT | 5 refills | Status: DC
Start: 2020-05-14 — End: 2021-03-03

## 2020-05-14 NOTE — Patient Instructions (Addendum)
1. Chronic rhinitis (grasses, trees and indoor molds) - Continue with  Zyrtec (cetirizine) 10mg  tablet once daily and Flonase (fluticasone) one spray per nostril daily  - Continue with nasal ipratropium every 8 hours as needed to help with drying out your nose.  - Continue with nasal saline rinses 1-2 times daily to remove allergens from the nasal cavities as well as help with mucous clearance (this is especially helpful to do before the nasal sprays are given)  - Consider allergy shots as a means of long-term control, but I want to get your breathing under better control.   2. Mild persistent asthma, uncomplicated - Lung testing looked slightly better, but still far from normal. - I will not push the Pulmonology visit right now, but I might in the future. - We are not going to make any medication changes at this time.  - Daily controller medication(s): Trelegy 100/62.5/25 one puff once daily - Prior to physical activity: albuterol 2 puffs 10-15 minutes before physical activity. - Rescue medications: albuterol 4 puffs every 4-6 hours as needed - Asthma control goals:  * Full participation in all desired activities (may need albuterol before activity) * Albuterol use two time or less a week on average (not counting use with activity) * Cough interfering with sleep two time or less a month * Oral steroids no more than once a year * No hospitalizations  3. Return in about 6 months (around 11/11/2020).    Please inform 01/11/2021 of any Emergency Department visits, hospitalizations, or changes in symptoms. Call us before going to the ED for breathing or allergy symptoms since we might be able to fit you in for a sick visit. Feel free to contact us anytime with any questions, problems, or concerns.  It was a pleasure to see you again today!  Websites that have reliable patient information: 1. American Academy of Asthma, Allergy, and Immunology: www.aaaai.org 2. Food Allergy Research and Education  (FARE): foodallergy.org 3. Mothers of Asthmatics: http://www.asthmacommunitynetwork.org 4. American College of Allergy, Asthma, and Immunology: www.acaai.org   COVID-19 Vaccine Information can be found at: [place] For questions related to vaccine distribution or appointments, please email [email or web address] or call [phone].     "Like" [phone] on Facebook and Instagram for our latest updates!     HAPPY FALL!     Make sure you are registered to vote! If you have moved or changed any of your contact information, you will need to get this updated before voting!  In some cases, you MAY be able to register to vote online: [place]

## 2020-05-14 NOTE — Progress Notes (Signed)
FOLLOW UP  Date of Service/Encounter:  05/14/20   Assessment:    Asthma COPD overlap syndrome  Persistent restrictive lung disease - not responsive to prednisone  Seasonal and perennial allergic rhinitis(grasses, trees and indoor molds)   Misty Clark continues to have spirometry values in the 30 to 35% range.  Symptomatically, she really reports using her albuterol once a week or so.  The Trelegy seems to be controlling her symptoms better than the [facility].  We did arrange for an urgent pulmonology referral and order the chest x-ray at the last visit, but she did not get either of these.  She really does not think she needs to go through all of this since she feels that good.  I did not push it more today.  We really went out of her way at the last visit to get her an urgent pulmonology appointment, so I did not want to go through all that again today if she was just going to no-show again.  She will keep [place] up-to-date on how she is in the long term and we can make changes at that time.  Plan/Recommendations:   1. Chronic rhinitis (grasses, trees and indoor molds) - Continue with  Zyrtec (cetirizine) 10mg  tablet once daily and Flonase (fluticasone) one spray per nostril daily  - Continue with nasal ipratropium every 8 hours as needed to help with drying out your nose.  - Continue with nasal saline rinses 1-2 times daily to remove allergens from the nasal cavities as well as help with mucous clearance (this is especially helpful to do before the nasal sprays are given)  - Consider allergy shots as a means of long-term control, but I want to get your breathing under better control.   2. Mild persistent asthma, uncomplicated - Lung testing looked slightly better, but still far from normal. - I will not push the Pulmonology visit right now, but I might in the future. - We are not going to make any medication changes at this time.  - Daily controller medication(s): Trelegy 100/62.5/25 one puff  once daily - Prior to physical activity: albuterol 2 puffs 10-15 minutes before physical activity. - Rescue medications: albuterol 4 puffs every 4-6 hours as needed - Asthma control goals:  * Full participation in all desired activities (may need albuterol before activity) * Albuterol use two time or less a week on average (not counting use with activity) * Cough interfering with sleep two time or less a month * Oral steroids no more than once a year * No hospitalizations  3. Return in about 6 months (around 11/11/2020).   Subjective:   Misty Clark is a 69 y.o. female presenting today for follow up of  Chief Complaint  Patient presents with  . Asthma    Misty Clark has a history of the following: Patient Active Problem List   Diagnosis Date Noted  . Mixed hyperlipidemia 12/26/2017  . Seasonal allergic rhinitis due to pollen 11/18/2017  . Generalized anxiety disorder 06/03/2008  . Essential hypertension, benign 06/03/2008  . GERD 06/03/2008  . Irritable bowel syndrome 06/03/2008  . Nonspecific abnormal results of cardiovascular function study 06/03/2008    History obtained from: chart review and patient.  Misty Clark is a 69 y.o. female presenting for a follow up visit.  She was last seen in July 2021. At that time, we continue with Zyrtec as well as Flonase and nasal ipratropium as needed.  Her asthma was largely not well controlled.  We had  recently changed her from [facility] to Trelegy without much improvement.  We did get a chest x-ray which was normal and sent her to pulmonology for evaluation.  In the interim, it does not seem like we got the chest x-ray result.  She was given an appointment with Dr. Vassie Loll with the end of September. She tells me that she never knew that she had an appointment.   In the interim, she had an upper respiratory infection. She had negative COVID testing. She was treated with acute sinusitis. She feels much better.  Otherwise, she has not needed  antibiotics or prednisone.   She is vaccinated against COVID19. Apparently her family does not know that she was vaccinated. She finished it in September Misty Clark). She does feel better about it.  She is much more comfortable traveling.  After our discussion at the last visit, she decided not to go to [place] to see her unvaccinated family.  Now that she has the [facility], she feels much more comfortable.   Asthma/Respiratory Symptom History: She does remain on the Trelegy one puff once daily. She has been using it regularly. Her coughing has improved and she has not used in 2-3 weeks. She never did get the chest X ray from the last visit.    Allergic Rhinitis Symptom History: Misty Clark has been doing better. She is using more natural products and using her air filters very often. They are making little changes to make it all better. She is using Sudafed daily as well.  Otherwise, there have been no changes to her past medical history, surgical history, family history, or social history.    Review of Systems  Constitutional: Negative.  Negative for chills, fever, malaise/fatigue and weight loss.  HENT: Negative.  Negative for congestion, ear discharge, ear pain and sore throat.   Eyes: Negative for pain, discharge and redness.  Respiratory: Negative for cough, sputum production, shortness of breath and wheezing.   Cardiovascular: Negative.  Negative for chest pain and palpitations.  Gastrointestinal: Negative for abdominal pain, constipation, diarrhea, heartburn, nausea and vomiting.  Skin: Negative.  Negative for itching and rash.  Neurological: Negative for dizziness and headaches.  Endo/Heme/Allergies: Negative for environmental allergies. Does not bruise/bleed easily.       Objective:   Today's Vitals   05/14/20 1053  BP: (!) 148/60  Pulse: 62  Resp: 18  Temp: 98.2 F (36.8 C)  TempSrc: Temporal  SpO2: 96%  Weight: 160 lb (72.6 kg)  Height: 5' 5.5" (1.664 m)     Body mass index is 26.22 kg/m.    Physical Exam:  Physical Exam Constitutional:      Appearance: She is well-developed.     Comments: Pleasant female. Cooperative with the exam.  HENT:     Head: Normocephalic and atraumatic.     Right Ear: Tympanic membrane, ear canal and external ear normal.     Left Ear: Tympanic membrane, ear canal and external ear normal.     Nose: No nasal deformity, septal deviation, mucosal edema or rhinorrhea.     Right Turbinates: Enlarged and swollen.     Left Turbinates: Enlarged and swollen.     Right Sinus: No maxillary sinus tenderness or frontal sinus tenderness.     Left Sinus: No maxillary sinus tenderness or frontal sinus tenderness.     Comments: No nasal polyps.    Mouth/Throat:     Mouth: Mucous membranes are not pale and not dry.     Pharynx:  Uvula midline.  Eyes:     General:        Right eye: No discharge.        Left eye: No discharge.     Conjunctiva/sclera: Conjunctivae normal.     Right eye: Right conjunctiva is not injected. No chemosis.    Left eye: Left conjunctiva is not injected. No chemosis.    Pupils: Pupils are equal, round, and reactive to light.  Cardiovascular:     Rate and Rhythm: Normal rate and regular rhythm.     Heart sounds: Normal heart sounds.  Pulmonary:     Effort: Pulmonary effort is normal. No tachypnea, accessory muscle usage or respiratory distress.     Breath sounds: Normal breath sounds. No wheezing, rhonchi or rales.     Comments: Moving air well in all lung fields. There is slightly decreased air movement at the bases.  Chest:     Chest wall: No tenderness.  Lymphadenopathy:     Cervical: No cervical adenopathy.  Skin:    General: Skin is warm.     Capillary Refill: Capillary refill takes less than 2 seconds.     Coloration: Skin is not pale.     Findings: No abrasion, erythema, petechiae or rash. Rash is not papular, urticarial or vesicular.     Comments: No eczematous lesions noted.   Neurological:     Mental Status: She is alert.  Psychiatric:        Behavior: Behavior is cooperative.      Diagnostic studies:    Spirometry: results abnormal (FEV1: 0.92/31%, FVC: 1.40/38%, FEV1/FVC: 66%).    Spirometry consistent with possible restrictive disease. Overall values are slightly higher compared to the last visit.   Allergy Studies: none       Malachi Bonds, MD  [facility]

## 2020-07-10 ENCOUNTER — Encounter: Payer: Self-pay | Admitting: Nurse Practitioner

## 2020-07-10 ENCOUNTER — Ambulatory Visit (INDEPENDENT_AMBULATORY_CARE_PROVIDER_SITE_OTHER): Payer: Medicare HMO | Admitting: Nurse Practitioner

## 2020-07-10 DIAGNOSIS — J0101 Acute recurrent maxillary sinusitis: Secondary | ICD-10-CM

## 2020-07-10 MED ORDER — FLUTICASONE PROPIONATE 50 MCG/ACT NA SUSP: 2.0000 | Freq: Every day | NASAL | 6 refills | Status: DC

## 2020-07-10 MED ORDER — AZITHROMYCIN 250 MG PO TABS: ORAL_TABLET | ORAL | 0 refills | Status: DC

## 2020-07-10 NOTE — Progress Notes (Signed)
Virtual Visit via telephone Note Due to COVID-19 pandemic this visit was conducted virtually. This visit type was conducted due to national recommendations for restrictions regarding the COVID-19 Pandemic (e.g. social distancing, sheltering in place) in an effort to limit this patient's exposure and mitigate transmission in our community. All issues noted in this document were discussed and addressed.  A physical exam was not performed with this format.  I connected with Misty Clark on 07/10/20 at 3:05 by telephone and verified that I am speaking with the correct person using two identifiers. Misty Clark is currently located at home and no one is currently with her during visit. The provider, Mary-Margaret Daphine Deutscher, FNP is located in their office at time of visit.  I discussed the limitations, risks, security and privacy concerns of performing an evaluation and management service by telephone and the availability of in person appointments. I also discussed with the patient that there may be a patient responsible charge related to this service. The patient expressed understanding and agreed to proceed.   History and Present Illness:   Chief Complaint: Otalgia   HPI Patient calls in c/o sinus congestion and ear pain. Started about 1 week ago. She says her cheek hurt and teeth hurt to chew.  * she says a zpak is the only antiobiotic that does not make her sick.  Review of Systems  Constitutional: Negative for chills and fever.  HENT: Positive for congestion and sinus pain. Negative for sore throat.   Respiratory: Positive for cough. Negative for sputum production and shortness of breath.   Neurological: Negative for dizziness and headaches.  Psychiatric/Behavioral: Negative.   All other systems reviewed and are negative.    Observations/Objective: Alert and oriented- answers all questions appropriately No distress No cough noted during visit  Assessment and Plan: Misty Clark in today with chief complaint of Otalgia   1. Acute recurrent maxillary sinusitis 1. Take meds as prescribed 2. Use a cool mist humidifier especially during the winter months and when heat has been humid. 3. Use saline nose sprays frequently 4. Saline irrigations of the nose can be very helpful if done frequently.  * 4X daily for 1 week*  * Use of a nettie pot can be helpful with this. Follow directions with this* 5. Drink plenty of fluids 6. Keep thermostat turn down low 7.For any cough or congestion  Use plain Mucinex- regular strength or max strength is fine   * Children- consult with Pharmacist for dosing 8. For fever or aces or pains- take tylenol or ibuprofen appropriate for age and weight.  * for fevers greater than 101 orally you may alternate ibuprofen and tylenol every  3 hours.   Meds ordered this encounter  Medications  . azithromycin (ZITHROMAX Z-PAK) 250 MG tablet    Sig: As directed    Dispense:  6 tablet    Refill:  0    Order Specific Question:   Supervising Provider    Answer:   Arville Care A [id number]  . fluticasone (FLONASE) 50 MCG/ACT nasal spray    Sig: Place 2 sprays into both nostrils daily.    Dispense:  16 g    Refill:  6    Order Specific Question:   Supervising Provider    Answer:   Arville Care A [[id number]]       Follow Up Instructions: prn    I discussed the assessment and treatment plan with the patient. The patient was provided an opportunity  to ask questions and all were answered. The patient agreed with the plan and demonstrated an understanding of the instructions.   The patient was advised to call back or seek an in-person evaluation if the symptoms worsen or if the condition fails to improve as anticipated.  The above assessment and management plan was discussed with the patient. The patient verbalized understanding of and has agreed to the management plan. Patient is aware to call the clinic if symptoms persist or  worsen. Patient is aware when to return to the clinic for a follow-up visit. Patient educated on when it is appropriate to go to the emergency department.   Time call ended:  3:17  I provided 12 minutes of non-face-to-face time during this encounter.    Mary-Margaret Daphine Deutscher, FNP

## 2020-07-23 ENCOUNTER — Other Ambulatory Visit: Payer: Self-pay | Admitting: Family Medicine

## 2020-07-28 ENCOUNTER — Ambulatory Visit (INDEPENDENT_AMBULATORY_CARE_PROVIDER_SITE_OTHER): Payer: Medicare HMO | Admitting: *Deleted

## 2020-07-28 DIAGNOSIS — Z0000 Encounter for general adult medical examination without abnormal findings: Secondary | ICD-10-CM | POA: Diagnosis not present

## 2020-07-28 NOTE — Progress Notes (Signed)
MEDICARE ANNUAL WELLNESS VISIT  07/28/2020  Telephone Visit Disclaimer This Medicare AWV was conducted by telephone due to national recommendations for restrictions regarding the COVID-19 Pandemic (e.g. social distancing).  I verified, using two identifiers, that I am speaking with Misty Clark or their authorized healthcare agent. I discussed the limitations, risks, security, and privacy concerns of performing an evaluation and management service by telephone and the potential availability of an in-person appointment in the future. The patient expressed understanding and agreed to proceed.  Location of Patient: Home Location of Provider (nurse):  [facility]  Subjective:    Misty RiggerDonna Lynn [place] is a 70 y.o. female patient of Raliegh IpGottschalk, Ashly M, DO who had a Medicare Annual Wellness Visit today via telephone. Misty Clark is retired and lives alone. She is widowed and has 4 children. She reports that she is socially active and does interact with friends/family regularly. She is moderately physically active and enjoys crafting, puzzles, reading, and gardening.  Patient Care Team: Raliegh IpGottschalk, Ashly M, DO as PCP - General (Family Medicine)  Advanced Directives 07/28/2020  Does Patient Have a Medical Advance Directive? No  Would patient like information on creating a medical advance directive? No - Patient declined    Hospital Utilization Over the Past 12 Months: # of hospitalizations or ER visits: 0 # of surgeries: 0  Review of Systems    Patient reports that her overall health is unchanged compared to last year.  History obtained from the patient and patient chart.   Patient Reported Readings (BP, Pulse, CBG, Weight, etc) none  Pain Assessment Pain : No/denies pain     Current Medications & Allergies (verified) Allergies as of 07/28/2020      Reactions   Codeine    REACTION: vomiting   Sulfonamide Derivatives    REACTION: rash      Medication List       Accurate as of July 28, 2020 10:59 AM. If you have any questions, ask your nurse or doctor.        albuterol 108 (90 Base) MCG/ACT inhaler Commonly known as: VENTOLIN HFA Inhale 2 puffs into the lungs every 4 (four) hours as needed for wheezing or shortness of breath.   ALPRAZolam 0.5 MG tablet Commonly known as: Xanax Take 1 tablet (0.5 mg total) by mouth daily as needed for anxiety. Place on file. Doesn't need yet   atorvastatin 40 MG tablet Commonly known as: LIPITOR Take 1 tablet (40 mg total) by mouth daily.   azithromycin 250 MG tablet Commonly known as: Zithromax Z-Pak As directed   cetirizine 10 MG chewable tablet Commonly known as: ZYRTEC Chew 10 mg by mouth in the morning.   citalopram 20 MG tablet Commonly known as: CELEXA Take 1 tablet (20 mg total) by mouth daily.   fluticasone 50 MCG/ACT nasal spray Commonly known as: FLONASE Place 2 sprays into both nostrils daily.   ipratropium 0.06 % nasal spray Commonly known as: ATROVENT every 8 hours as needed to help with drying out your nose   levocetirizine 5 MG tablet Commonly known as: Xyzal Take 1 tablet (5 mg total) by mouth every evening.   Trelegy Ellipta 200-62.5-25 MCG/INH Aepb Generic drug: Fluticasone-Umeclidin-Vilant Inhale 1 puff into the lungs daily.   triamterene-hydrochlorothiazide 37.5-25 MG tablet Commonly known as: MAXZIDE-25 Take 1 tablet by mouth daily.       History (reviewed): Past Medical History:  Diagnosis Date  . Allergy   . Anxiety   . Arthritis   .  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 Hypertension   . IBS (irritable bowel syndrome)    Mixed  . Multiple allergies   . Recurrent upper respiratory infection (URI)    Past Surgical History:  Procedure Laterality Date  . ABDOMINAL HYSTERECTOMY    . CHOLECYSTECTOMY    . SHOULDER SURGERY Left 2012  . SINOSCOPY    . SINUS SURGERY WITH INSTATRAK     Family History  Problem Relation Age of Onset  . Heart failure Maternal  Grandmother        CHF  . Breast cancer Sister   . Heart attack Paternal Grandfather        MI  . Coronary artery disease Paternal Grandfather        At a premature age  . Coronary artery disease Other   . Colon cancer Other   . Prostate cancer Other   . Ovarian cancer Other   . Asthma Mother   . Thyroid disease Mother   . Heart disease Father   . Hypertension Daughter   . Allergic rhinitis Neg Hx   . Angioedema Neg Hx   . Atopy Neg Hx   . Eczema Neg Hx   . Immunodeficiency Neg Hx   . Urticaria Neg Hx    Social History   Socioeconomic History  . Marital status: Widowed    Spouse name: Not on file  . Number of children: 4  . Years of education: Not on file  . Highest education level: Some college, no degree  Occupational History  . Occupation: Retired Nursing    Comment: Engineer, [employer]  Tobacco Use  . Smoking status: Former Smoker    Packs/day: 0.50    Years: 3.00    Pack years: 1.50  . Smokeless tobacco: Never Used  . Tobacco comment: Quit many years ago.  Vaping Use  . Vaping Use: Never used  Substance and Sexual Activity  . Alcohol use: No  . Drug use: Never  . Sexual activity: Not on file  Other Topics Concern  . Not on file  Social History Narrative   Retired Arts administrator. 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 Lives alone. Enjoys crafting, puzzles, reading, and gardening.    Social Determinants of Health   Financial Resource Strain: Not on file  Food Insecurity: Not on file  Transportation Needs: Not on file  Physical Activity: Not on file  Stress: Not on file  Social Connections: Not on file    Activities of Daily Living In your present state of health, do you have any difficulty performing the following activities: 07/28/2020  Hearing? N  Vision? N  Difficulty concentrating or making decisions? N  Walking or climbing stairs? N  Dressing or bathing? N  Doing errands, shopping? N  Preparing Food and eating ? N  Using the Toilet? N  In  the past six months, have you accidently leaked urine? N  Do you have problems with loss of bowel control? N  Managing your Medications? N  Managing your Finances? N  Housekeeping or managing your Housekeeping? N  Some recent data might be hidden    Patient Education/ Literacy How often do you need to have someone help you when you read instructions, pamphlets, or other written materials from your doctor or pharmacy?: 1 - Never What is the last grade level you completed in school?: some college  Exercise Current Exercise Habits: Home exercise routine, Type of exercise: Other - see comments (stationary bike), Time (Minutes): 25,  Frequency (Times/Week): 7, Weekly Exercise (Minutes/Week): 175, Intensity: Mild, Exercise limited by: None identified  Diet Patient reports consuming 3 meals a day and 1 snack(s) a day Patient reports that her primary diet is: Regular Patient reports that she does have regular access to food.   Depression Screen PHQ 2/9 Scores 07/28/2020 01/08/2020 01/08/2020 11/20/2019 10/03/2019 11/06/2018 08/09/2018  PHQ - 2 Score 0 0 0 0 0 1 0  PHQ- 9 Score - 0 - 0 0 - 0     Fall Risk Fall Risk  07/28/2020 01/08/2020 10/03/2019 08/09/2018 07/28/2018  Falls in the past year? 0 0 0 1 1  Number falls in past yr: - - - 0 0  Injury with Fall? - - - 1 -     Objective:  Misty Clark seemed alert and oriented and she participated appropriately during our telephone visit.  Blood Pressure Weight BMI  BP Readings from Last 3 Encounters:  05/14/20 (!) 148/60  02/01/20 (!) 138/80  01/18/20 128/74   Wt Readings from Last 3 Encounters:  05/14/20 160 lb (72.6 kg)  01/08/20 160 lb 6.4 oz (72.8 kg)  11/28/19 160 lb 12.8 oz (72.9 kg)   BMI Readings from Last 1 Encounters:  05/14/20 26.22 kg/m    *Unable to obtain current vital signs, weight, and BMI due to telephone visit type  Hearing/Vision  . Ajah did not seem to have difficulty with hearing/understanding during the telephone  conversation . Reports that she has had a formal eye exam by an eye care professional within the past year . Reports that she has had a formal hearing evaluation within the past year *Unable to fully assess hearing and vision during telephone visit type  Cognitive Function: 6CIT Screen 07/28/2020  What Year? 0 points  What month? 0 points  What time? 0 points  Count back from 20 0 points  Months in reverse 0 points  Repeat phrase 0 points  Total Score 0   (Normal:0-7, Significant for Dysfunction: >8)  Normal Cognitive Function Screening: Yes   Immunization & Health Maintenance Record Immunization History  Administered Date(s) Administered  . Influenza,inj,Quad PF,6+ Mos 03/29/2018  . Janssen (J&J) SARS-COV-2 Vaccination 03/25/2020  . Pneumococcal Conjugate-13 06/02/2015  . Pneumococcal Polysaccharide-23 07/28/2016    Health Maintenance  Topic Date Due  . TETANUS/TDAP  Never done  . Fecal DNA (Cologuard)  Never done  . MAMMOGRAM  12/27/2019  . INFLUENZA VACCINE  02/03/2020  . COVID-19 Vaccine (2 - Booster for Janssen series) 05/20/2020  . DEXA SCAN  Completed  . Hepatitis C Screening  Completed  . PNA vac Low Risk Adult  Completed       Assessment  This is a routine wellness examination for Brinnley Lacap.  Health Maintenance: Due or Overdue Health Maintenance Due  Topic Date Due  . TETANUS/TDAP  Never done  . Fecal DNA (Cologuard)  Never done  . MAMMOGRAM  12/27/2019  . INFLUENZA VACCINE  02/03/2020  . COVID-19 Vaccine (2 - Booster for Linwood Dibbles series) 05/20/2020    Misty Clark does not need a referral for Community Assistance: Care Management:   no Social Work:    no Prescription Assistance:  no Nutrition/Diabetes Education:  no   Plan:  Personalized Goals Goals Addressed            This Visit's Progress   . Patient Stated       07/28/2020 AWV Goal: Keep All Scheduled Appointments  Over the next year, patient will attend  all scheduled  appointments with their PCP and any specialists that they see.       Personalized Health Maintenance & Screening Recommendations  Colorectal cancer screening , Mammogram, Tdap Lung Cancer Screening Recommended: no (Low Dose CT Chest recommended if Age 57-80 years, 30 pack-year currently smoking OR have quit w/in past 15 years) Hepatitis C Screening recommended: no HIV Screening recommended: no  Advanced Directives: Written information was not prepared per patient's request.  Referrals & Orders No orders of the defined types were placed in this encounter.   Follow-up Plan . Follow-up with Raliegh Ip, DO as planned   I have personally reviewed and noted the following in the patient's chart:   . Medical and social history . Use of alcohol, tobacco or illicit drugs  . Current medications and supplements . Functional ability and status . Nutritional status . Physical activity . Advanced directives . List of other physicians . Hospitalizations, surgeries, and ER visits in previous 12 months . Vitals . Screenings to include cognitive, depression, and falls . Referrals and appointments  In addition, I have reviewed and discussed with Misty Clark certain preventive protocols, quality metrics, and best practice recommendations. A written personalized care plan for preventive services as well as general preventive health recommendations is available and can be mailed to the patient at her request.      Adella Hare, LPN [id number]

## 2020-08-05 ENCOUNTER — Encounter: Payer: Self-pay | Admitting: Family Medicine

## 2020-08-05 ENCOUNTER — Ambulatory Visit (INDEPENDENT_AMBULATORY_CARE_PROVIDER_SITE_OTHER): Payer: Medicare HMO | Admitting: Family Medicine

## 2020-08-05 ENCOUNTER — Other Ambulatory Visit: Payer: Self-pay

## 2020-08-05 VITALS — BP 138/86 | HR 86 | Temp 98.3°F | Wt 160.8 lb

## 2020-08-05 DIAGNOSIS — E782 Mixed hyperlipidemia: Secondary | ICD-10-CM | POA: Diagnosis not present

## 2020-08-05 DIAGNOSIS — J302 Other seasonal allergic rhinitis: Secondary | ICD-10-CM

## 2020-08-05 DIAGNOSIS — F411 Generalized anxiety disorder: Secondary | ICD-10-CM | POA: Diagnosis not present

## 2020-08-05 DIAGNOSIS — I10 Essential (primary) hypertension: Secondary | ICD-10-CM | POA: Diagnosis not present

## 2020-08-05 DIAGNOSIS — R69 Illness, unspecified: Secondary | ICD-10-CM | POA: Diagnosis not present

## 2020-08-05 MED ORDER — ALPRAZOLAM 0.5 MG PO TABS: 0.5000 mg | ORAL_TABLET | Freq: Every day | ORAL | 0 refills | Status: DC | PRN

## 2020-08-05 MED ORDER — CITALOPRAM HYDROBROMIDE 20 MG PO TABS: 20.0000 mg | ORAL_TABLET | Freq: Every day | ORAL | 3 refills | Status: DC

## 2020-08-05 MED ORDER — MONTELUKAST SODIUM 10 MG PO TABS: 10.0000 mg | ORAL_TABLET | Freq: Every day | ORAL | 3 refills | Status: DC

## 2020-08-05 MED ORDER — ATORVASTATIN CALCIUM 40 MG PO TABS: 40.0000 mg | ORAL_TABLET | Freq: Every day | ORAL | 3 refills | Status: DC

## 2020-08-05 MED ORDER — TRIAMTERENE-HCTZ 37.5-25 MG PO TABS: 1.0000 | ORAL_TABLET | Freq: Every day | ORAL | 3 refills | Status: DC

## 2020-08-05 NOTE — Progress Notes (Signed)
Subjective: CC: Follow-up anxiety disorder, asthma-COPD overlap syndrome, hyperlipidemia and hypertension PCP: Raliegh Ip, DO ZOX:[id number] Misty Clark is a 70 y.o. female presenting to clinic today for:  1.  Hyperlipidemia with hypertension Patient is compliant with her Lipitor, Maxide.  No reports of chest pain.  She does occasionally have shortness of breath and is treated with Trelegy and albuterol for this.  No reports of edema or dizziness  2.  Sinusitis She has been having some frontal sinusitis that is affecting her nares as well.  She has been on antibiotics.  She is treated with Flonase, Atrovent, Xyzal and Zyrtec along with her asthma medications as above.  She is also doing saline rinses.  She did notice a little bit of improvement after the antibiotics but really feels like symptoms are related to change in weather.  Previously treated with Singulair and this was discontinued for an unknown reason.  No fevers, hemoptysis.  She does get laryngitis sometimes from postnasal drainage  3.  Anxiety disorder Patient is compliant with Celexa 20 mg daily.  She continues to use alprazolam intermittently.  Denies any excessive daytime sedation, falls, respiratory depression or visual or auditory hallucinations.  Certainly no alcohol use or drug use.  ROS: Per HPI  Allergies  Allergen Reactions  . Codeine     REACTION: vomiting  . Sulfonamide Derivatives     REACTION: rash   Past Medical History:  Diagnosis Date  . Allergy   . Anxiety   . Arthritis   . 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 Hypertension   . IBS (irritable bowel syndrome)    Mixed  . Multiple allergies   . Recurrent upper respiratory infection (URI)     Current Outpatient Medications:  .  albuterol (VENTOLIN HFA) 108 (90 Base) MCG/ACT inhaler, Inhale 2 puffs into the lungs every 4 (four) hours as needed for wheezing or shortness of breath., Disp: 8 g, Rfl: 1 .  ALPRAZolam (XANAX) 0.5 MG tablet,  Take 1 tablet (0.5 mg total) by mouth daily as needed for anxiety. Place on file. Doesn't need yet, Disp: 20 tablet, Rfl: 0 .  atorvastatin (LIPITOR) 40 MG tablet, Take 1 tablet (40 mg total) by mouth daily., Disp: 90 tablet, Rfl: 3 .  cetirizine (ZYRTEC) 10 MG chewable tablet, Chew 10 mg by mouth in the morning. , Disp: , Rfl:  .  citalopram (CELEXA) 20 MG tablet, Take 1 tablet (20 mg total) by mouth daily., Disp: 90 tablet, Rfl: 3 .  fluticasone (FLONASE) 50 MCG/ACT nasal spray, Place 2 sprays into both nostrils daily., Disp: 16 g, Rfl: 6 .  Fluticasone-Umeclidin-Vilant (TRELEGY ELLIPTA) 200-62.5-25 MCG/INH AEPB, Inhale 1 puff into the lungs daily., Disp: 30 each, Rfl: 5 .  ipratropium (ATROVENT) 0.06 % nasal spray, every 8 hours as needed to help with drying out your nose, Disp: 15 mL, Rfl: 5 .  levocetirizine (XYZAL) 5 MG tablet, Take 1 tablet (5 mg total) by mouth every evening., Disp: 90 tablet, Rfl: 3 .  triamterene-hydrochlorothiazide (MAXZIDE-25) 37.5-25 MG tablet, Take 1 tablet by mouth daily., Disp: 90 tablet, Rfl: 3 Social History   Socioeconomic History  . Marital status: Widowed    Spouse name: Not on file  . Number of children: 4  . Years of education: Not on file  . Highest education level: Some college, no degree  Occupational History  . Occupation: Retired Nursing    Comment: Engineer, [employer]  Tobacco Use  . Smoking status: Former Smoker  Packs/day: 0.50    Years: 3.00    Pack years: 1.50  . Smokeless tobacco: Never Used  . Tobacco comment: Quit many years ago.  Vaping Use  . Vaping Use: Never used  Substance and Sexual Activity  . Alcohol use: No  . Drug use: Never  . Sexual activity: Not on file  Other Topics Concern  . Not on file  Social History Narrative   Retired Arts administrator. 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 Lives alone. Enjoys crafting, puzzles, reading, and gardening.    Social Determinants of Health   Financial Resource Strain: Not on  file  Food Insecurity: Not on file  Transportation Needs: Not on file  Physical Activity: Not on file  Stress: Not on file  Social Connections: Not on file  Intimate Partner Violence: Not on file   Family History  Problem Relation Age of Onset  . Heart failure Maternal Grandmother        CHF  . Breast cancer Sister   . Heart attack Paternal Grandfather        MI  . Coronary artery disease Paternal Grandfather        At a premature age  . Coronary artery disease Other   . Colon cancer Other   . Prostate cancer Other   . Ovarian cancer Other   . Asthma Mother   . Thyroid disease Mother   . Heart disease Father   . Hypertension Daughter   . Allergic rhinitis Neg Hx   . Angioedema Neg Hx   . Atopy Neg Hx   . Eczema Neg Hx   . Immunodeficiency Neg Hx   . Urticaria Neg Hx     Objective: Office vital signs reviewed. BP 138/86   Pulse 86   Temp 98.3 F (36.8 C)   Wt 160 lb 12.8 oz (72.9 kg)   SpO2 97%   BMI 26.35 kg/m   Physical Examination:  General: Awake, alert, well nourished, No acute distress HEENT: Normal; sclera white.  Moist mucous membranes. Cardio: regular rate and rhythm, S1S2 heard, no murmurs appreciated Pulm: clear to auscultation bilaterally, no wheezes, rhonchi or rales; normal work of breathing on room air Extremities: warm, well perfused, No edema, cyanosis or clubbing; +2 pulses bilaterally MSK: normal gait and station Psych: Mood is stable, speech normal, affect appropriate Depression screen [facility] 2/9 08/05/2020 07/28/2020 01/08/2020  Decreased Interest 0 0 0  Down, Depressed, Hopeless 0 0 0  PHQ - 2 Score 0 0 0  Altered sleeping 0 - 0  Tired, decreased energy 0 - 0  Change in appetite 0 - 0  Feeling bad or failure about yourself  0 - 0  Trouble concentrating 0 - 0  Moving slowly or fidgety/restless 0 - 0  Suicidal thoughts 0 - 0  PHQ-9 Score 0 - 0  Difficult doing work/chores - - -  Some recent data might be hidden   GAD 7 : Generalized  Anxiety Score 01/08/2020 11/06/2018 07/03/2018 03/29/2018  Nervous, Anxious, on Edge 0 1 0 1  Control/stop worrying 0 3 0 1  Worry too much - different things 0 3 0 0  Trouble relaxing 0 1 0 0  Restless 0 0 0 0  Easily annoyed or irritable 0 1 0 0  Afraid - awful might happen 0 1 0 1  Total GAD 7 Score 0 10 0 3  Anxiety Difficulty - Somewhat difficult - Not difficult at all   Assessment/ Plan: 70 y.o. female  Essential hypertension, benign - Plan: triamterene-hydrochlorothiazide (MAXZIDE-25) 37.5-25 MG tablet  Mixed hyperlipidemia - Plan: atorvastatin (LIPITOR) 40 MG tablet  Generalized anxiety disorder - Plan: ALPRAZolam (XANAX) 0.5 MG tablet, citalopram (CELEXA) 20 MG tablet  Seasonal allergic rhinitis, unspecified trigger - Plan: montelukast (SINGULAIR) 10 MG tablet  Blood pressure well controlled.  Continue current regimen.  Continue statin  Very rare use of alprazolam.  Last refill greater than 6 months ago from #20.  The national narcotic database was reviewed and there were no red flags.  She is up-to-date on UDS and CSC.  Continue Celexa for primary management of anxiety disorder  Trial of Singulair.  Advised to discontinue either Xyzal or Zyrtec.  If this is helpful we will continue refilling.  She will follow-up as needed on this issue  No orders of the defined types were placed in this encounter.  No orders of the defined types were placed in this encounter.    Raliegh Ip, DO [facility] [phone]

## 2020-10-24 ENCOUNTER — Encounter: Payer: Self-pay | Admitting: *Deleted

## 2020-11-12 ENCOUNTER — Ambulatory Visit: Payer: Medicare HMO | Admitting: Allergy & Immunology

## 2020-11-13 IMAGING — DX DG WRIST COMPLETE 3+V*R*
3 series · 3 of 3 positions shown · non-contrast
Comparison: None

CLINICAL DATA: Fell several days ago, continued RIGHT wrist pain

EXAM:
RIGHT WRIST - COMPLETE 3+ VIEW

[wrist ap]
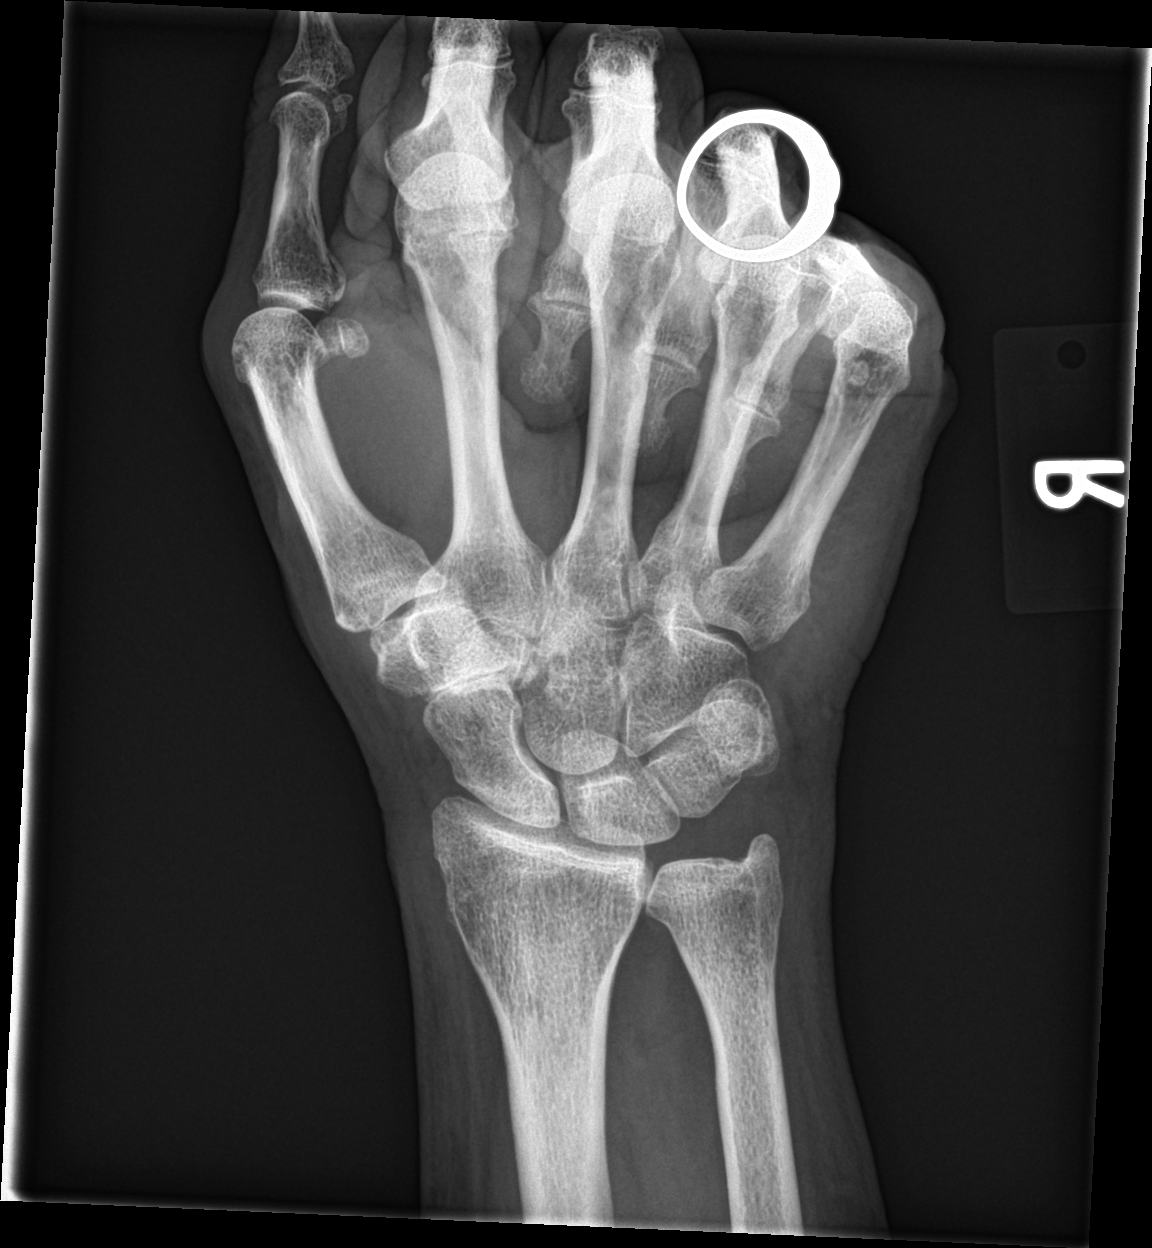

[wrist obl]
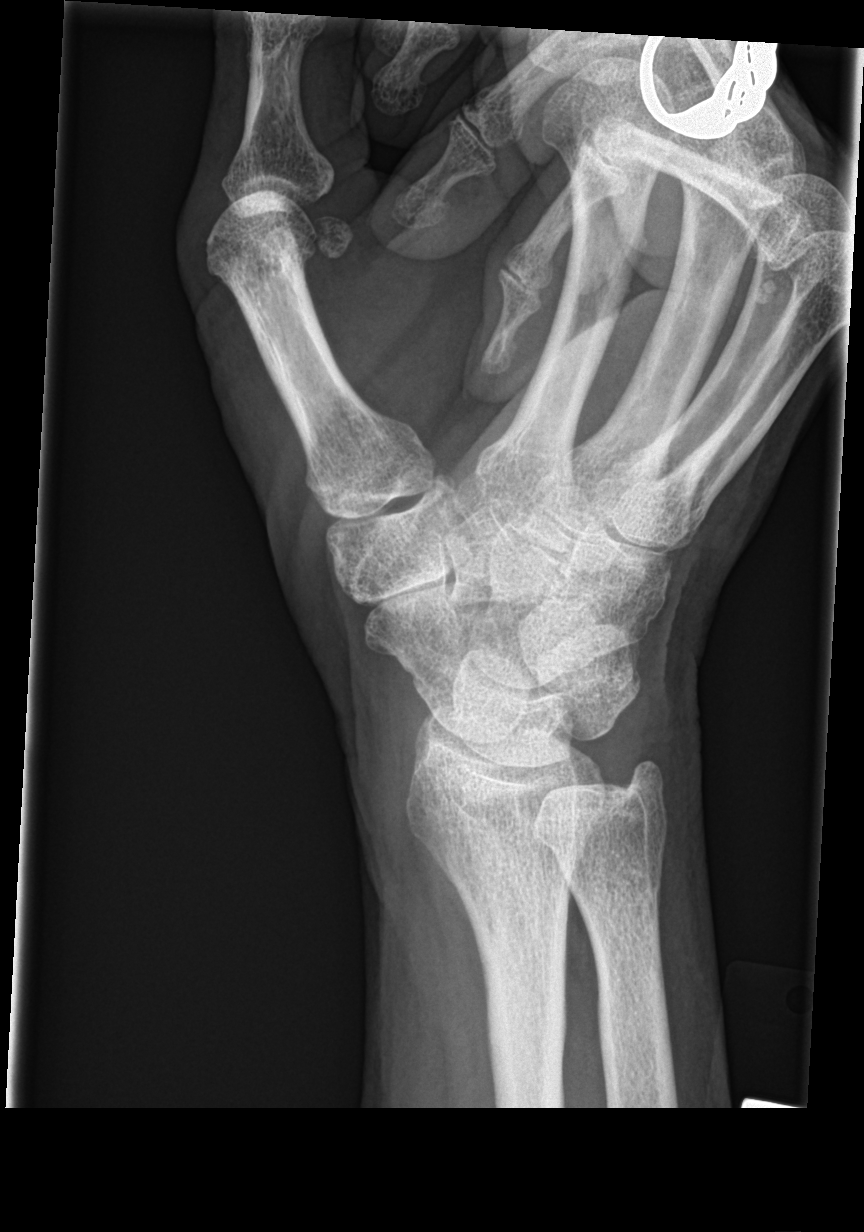

[wrist lat]
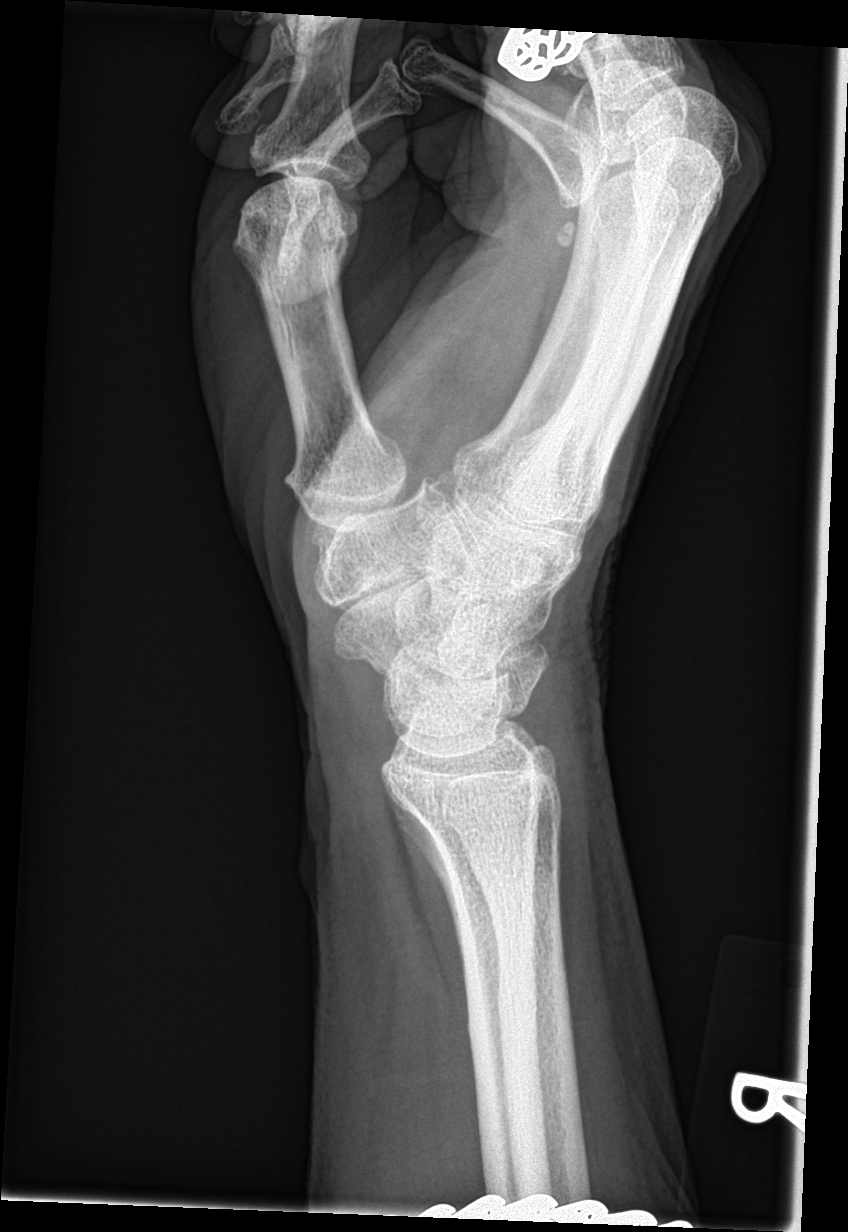

[3 of 3 positions shown; findings below may reference images not displayed]

FINDINGS: Osseous demineralization.

Joint space narrowing at STT joint and to lesser degree at first CMC
joint.

Remaining joint spaces preserved.

No acute fracture, dislocation, or bone destruction.
IMPRESSION: No acute osseous abnormalities.

## 2020-11-19 ENCOUNTER — Ambulatory Visit: Payer: Medicare HMO | Admitting: Allergy & Immunology

## 2020-12-09 ENCOUNTER — Ambulatory Visit (INDEPENDENT_AMBULATORY_CARE_PROVIDER_SITE_OTHER): Payer: Medicare HMO | Admitting: Family Medicine

## 2020-12-09 DIAGNOSIS — F4321 Adjustment disorder with depressed mood: Secondary | ICD-10-CM

## 2020-12-09 DIAGNOSIS — R69 Illness, unspecified: Secondary | ICD-10-CM | POA: Diagnosis not present

## 2020-12-09 DIAGNOSIS — J0111 Acute recurrent frontal sinusitis: Secondary | ICD-10-CM

## 2020-12-09 MED ORDER — AZITHROMYCIN 250 MG PO TABS: ORAL_TABLET | ORAL | 0 refills | Status: DC

## 2020-12-09 NOTE — Progress Notes (Signed)
Telephone visit  Subjective: CC: Sinusitis PCP: Raliegh Ip, DO MEQ:ASTMH Ahleah Simko is a 70 y.o. female calls for telephone consult today. Patient provides verbal consent for consult held via phone.  Due to COVID-19 pandemic this visit was conducted virtually. This visit type was conducted due to national recommendations for restrictions regarding the COVID-19 Pandemic (e.g. social distancing, sheltering in place) in an effort to limit this patient's exposure and mitigate transmission in our community. All issues noted in this document were discussed and addressed.  A physical exam was not performed with this format.   Location of patient: home Location of provider: [facility] Others present for call: none  1. Sinusitis She reports her father passed away recently.  Her mother is sick and is hospitalized. She has been using pseudofed/ zyrtec/ xyzal.  She is treated with Singulair as well.  She reports copious sinus drainage.  She reports onset a couple of weeks ago and drainage had been clear but this morning discharge became opaque, thick and yellow.  No myalgia, fevers.  Cough only present in am when she is trying to clear out mucus.  No wheezes or shortness of breath.  No hemoptysis.  No fevers.  No known sick contacts.   ROS: Per HPI  Allergies  Allergen Reactions  . Codeine     REACTION: vomiting  . Sulfonamide Derivatives     REACTION: rash   Past Medical History:  Diagnosis Date  . Allergy   . Anxiety   . Arthritis   . 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 Hypertension   . IBS (irritable bowel syndrome)    Mixed  . Multiple allergies   . Recurrent upper respiratory infection (URI)     Current Outpatient Medications:  .  albuterol (VENTOLIN HFA) 108 (90 Base) MCG/ACT inhaler, Inhale 2 puffs into the lungs every 4 (four) hours as needed for wheezing or shortness of breath., Disp: 8 g, Rfl: 1 .  ALPRAZolam (XANAX) 0.5 MG tablet, Take 1 tablet (0.5 mg total) by  mouth daily as needed for anxiety. Place on file. Doesn't need yet, Disp: 20 tablet, Rfl: 0 .  atorvastatin (LIPITOR) 40 MG tablet, Take 1 tablet (40 mg total) by mouth daily., Disp: 90 tablet, Rfl: 3 .  citalopram (CELEXA) 20 MG tablet, Take 1 tablet (20 mg total) by mouth daily., Disp: 90 tablet, Rfl: 3 .  fluticasone (FLONASE) 50 MCG/ACT nasal spray, Place 2 sprays into both nostrils daily., Disp: 16 g, Rfl: 6 .  Fluticasone-Umeclidin-Vilant (TRELEGY ELLIPTA) 200-62.5-25 MCG/INH AEPB, Inhale 1 puff into the lungs daily., Disp: 30 each, Rfl: 5 .  ipratropium (ATROVENT) 0.06 % nasal spray, every 8 hours as needed to help with drying out your nose, Disp: 15 mL, Rfl: 5 .  levocetirizine (XYZAL) 5 MG tablet, Take 1 tablet (5 mg total) by mouth every evening., Disp: 90 tablet, Rfl: 3 .  montelukast (SINGULAIR) 10 MG tablet, Take 1 tablet (10 mg total) by mouth at bedtime., Disp: 30 tablet, Rfl: 3 .  triamterene-hydrochlorothiazide (MAXZIDE-25) 37.5-25 MG tablet, Take 1 tablet by mouth daily., Disp: 90 tablet, Rfl: 3  Assessment/ Plan: 70 y.o. female   Acute recurrent frontal sinusitis - Plan: azithromycin (ZITHROMAX) 250 MG tablet  Grief reaction  Recurrent sinusitis.  Z-Pak prescribed.  Home care instructions reviewed.  Continue antihistamines and nasal sprays.  I do not think that she needs prednisone at this time but we discussed if she starts developing any COPD like exacerbation that she is  to contact me and we will prescribe.  With regards to her grief reaction, it sounds like she is going through normal grieving process but I did encourage her to reach out to me at any time should things worsen.  She voiced good understanding of the plan.  Start time: 2:21p  End time: 2:30p  Total time spent on patient care (including telephone call/ virtual visit): 9 minutes  Skya Mccullum Hulen Skains, DO [facility] [phone]

## 2020-12-29 ENCOUNTER — Other Ambulatory Visit: Payer: Self-pay | Admitting: Family Medicine

## 2021-01-02 ENCOUNTER — Encounter: Payer: Medicare HMO | Admitting: Family Medicine

## 2021-01-13 ENCOUNTER — Encounter: Payer: Medicare HMO | Admitting: Family Medicine

## 2021-02-16 DIAGNOSIS — Z1231 Encounter for screening mammogram for malignant neoplasm of breast: Secondary | ICD-10-CM | POA: Diagnosis not present

## 2021-02-25 ENCOUNTER — Ambulatory Visit: Payer: Medicare HMO | Admitting: Allergy & Immunology

## 2021-03-03 ENCOUNTER — Encounter: Payer: Self-pay | Admitting: Family Medicine

## 2021-03-03 ENCOUNTER — Other Ambulatory Visit: Payer: Self-pay

## 2021-03-03 ENCOUNTER — Ambulatory Visit (INDEPENDENT_AMBULATORY_CARE_PROVIDER_SITE_OTHER): Payer: Medicare HMO | Admitting: Family Medicine

## 2021-03-03 VITALS — BP 150/82 | HR 89 | Temp 97.4°F | Ht 65.5 in | Wt 163.8 lb

## 2021-03-03 DIAGNOSIS — J302 Other seasonal allergic rhinitis: Secondary | ICD-10-CM | POA: Diagnosis not present

## 2021-03-03 DIAGNOSIS — E782 Mixed hyperlipidemia: Secondary | ICD-10-CM | POA: Diagnosis not present

## 2021-03-03 DIAGNOSIS — R69 Illness, unspecified: Secondary | ICD-10-CM | POA: Diagnosis not present

## 2021-03-03 DIAGNOSIS — I10 Essential (primary) hypertension: Secondary | ICD-10-CM | POA: Diagnosis not present

## 2021-03-03 DIAGNOSIS — F411 Generalized anxiety disorder: Secondary | ICD-10-CM | POA: Diagnosis not present

## 2021-03-03 DIAGNOSIS — Z0001 Encounter for general adult medical examination with abnormal findings: Secondary | ICD-10-CM | POA: Diagnosis not present

## 2021-03-03 DIAGNOSIS — Z79899 Other long term (current) drug therapy: Secondary | ICD-10-CM

## 2021-03-03 DIAGNOSIS — Z0000 Encounter for general adult medical examination without abnormal findings: Secondary | ICD-10-CM

## 2021-03-03 MED ORDER — LEVOCETIRIZINE DIHYDROCHLORIDE 5 MG PO TABS: 5.0000 mg | ORAL_TABLET | Freq: Every evening | ORAL | 3 refills | Status: DC

## 2021-03-03 MED ORDER — FLUTICASONE PROPIONATE 50 MCG/ACT NA SUSP: 2.0000 | Freq: Every day | NASAL | 3 refills | Status: DC

## 2021-03-03 MED ORDER — ALPRAZOLAM 0.5 MG PO TABS: 0.5000 mg | ORAL_TABLET | Freq: Every day | ORAL | 1 refills | Status: DC | PRN

## 2021-03-03 MED ORDER — MONTELUKAST SODIUM 10 MG PO TABS: 10.0000 mg | ORAL_TABLET | Freq: Every day | ORAL | 3 refills | Status: DC

## 2021-03-03 MED ORDER — IPRATROPIUM BROMIDE 0.06 % NA SOLN: 1.0000 | Freq: Three times a day (TID) | NASAL | 3 refills | Status: DC

## 2021-03-03 NOTE — Patient Instructions (Signed)
Colon cancer screening OVER DUE Come in for FASTING labs  Preventive Care 71 Years and Older, Female Preventive care refers to lifestyle choices and visits with your health care provider that can promote health and wellness. This includes: A yearly physical exam. This is also called an annual wellness visit. Regular dental and eye exams. Immunizations. Screening for certain conditions. Healthy lifestyle choices, such as: Eating a healthy diet. Getting regular exercise. Not using drugs or products that contain nicotine and tobacco. Limiting alcohol use. What can I expect for my preventive care visit? Physical exam Your health care provider will check your: Height and weight. These may be used to calculate your BMI (body mass index). BMI is a measurement that tells if you are at a healthy weight. Heart rate and blood pressure. Body temperature. Skin for abnormal spots. Counseling Your health care provider may ask you questions about your: Past medical problems. Family's medical history. Alcohol, tobacco, and drug use. Emotional well-being. Home life and relationship well-being. Sexual activity. Diet, exercise, and sleep habits. History of falls. Memory and ability to understand (cognition). Work and work Statistician. Pregnancy and menstrual history. Access to firearms. What immunizations do I need?  Vaccines are usually given at various ages, according to a schedule. Your health care provider will recommend vaccines for you based on your age, medicalhistory, and lifestyle or other factors, such as travel or where you work. What tests do I need? Blood tests Lipid and cholesterol levels. These may be checked every 5 years, or more often depending on your overall health. Hepatitis C test. Hepatitis B test. Screening Lung cancer screening. You may have this screening every year starting at age 71 if you have a 30-pack-year history of smoking and currently smoke or have quit  within the past 15 years. Colorectal cancer screening. All adults should have this screening starting at age 71 and continuing until age 71. Your health care provider may recommend screening at age 71 if you are at increased risk. You will have tests every 1-10 years, depending on your results and the type of screening test. Diabetes screening. This is done by checking your blood sugar (glucose) after you have not eaten for a while (fasting). You may have this done every 1-3 years. Mammogram. This may be done every 1-2 years. Talk with your health care provider about how often you should have regular mammograms. Abdominal aortic aneurysm (AAA) screening. You may need this if you are a current or former smoker. BRCA-related cancer screening. This may be done if you have a family history of breast, ovarian, tubal, or peritoneal cancers. Other tests STD (sexually transmitted disease) testing, if you are at risk. Bone density scan. This is done to screen for osteoporosis. You may have this done starting at age 71. Talk with your health care provider about your test results, treatment options,and if necessary, the need for more tests. Follow these instructions at home: Eating and drinking  Eat a diet that includes fresh fruits and vegetables, whole grains, lean protein, and low-fat dairy products. Limit your intake of foods with high amounts of sugar, saturated fats, and salt. Take vitamin and mineral supplements as recommended by your health care provider. Do not drink alcohol if your health care provider tells you not to drink. If you drink alcohol: Limit how much you have to 0-1 drink a day. Be aware of how much alcohol is in your drink. In the [place] one drink equals one 12 oz bottle of beer (355 mL),  one 5 oz glass of wine (148 mL), or one 1 oz glass of hard liquor (44 mL).  Lifestyle Take daily care of your teeth and gums. Brush your teeth every morning and night with fluoride  toothpaste. Floss one time each day. Stay active. Exercise for at least 30 minutes 5 or more days each week. Do not use any products that contain nicotine or tobacco, such as cigarettes, e-cigarettes, and chewing tobacco. If you need help quitting, ask your health care provider. Do not use drugs. If you are sexually active, practice safe sex. Use a condom or other form of protection in order to prevent STIs (sexually transmitted infections). Talk with your health care provider about taking a low-dose aspirin or statin. Find healthy ways to cope with stress, such as: Meditation, yoga, or listening to music. Journaling. Talking to a trusted person. Spending time with friends and family. Safety Always wear your seat belt while driving or riding in a vehicle. Do not drive: If you have been drinking alcohol. Do not ride with someone who has been drinking. When you are tired or distracted. While texting. Wear a helmet and other protective equipment during sports activities. If you have firearms in your house, make sure you follow all gun safety procedures. What's next? Visit your health care provider once a year for an annual wellness visit. Ask your health care provider how often you should have your eyes and teeth checked. Stay up to date on all vaccines. This information is not intended to replace advice given to you by your health care provider. Make sure you discuss any questions you have with your healthcare provider. Document Revised: 06/11/2020 Document Reviewed: 06/15/2018 Elsevier Patient Education  2022 [employer].

## 2021-03-03 NOTE — Progress Notes (Signed)
Misty Clark is a 70 y.o. female presents to office today for annual physical exam examination.    Concerns today include: 1.  Breathing Patient reports that she discontinued Trelegy because she felt that it was making her worse.  Since we have gotten more humidity she does note that the breathing seems a little worse off such that she has been using her albuterol inhaler more often.  She would be willing to try breztri again.  Needs refills on allergy medicines as well  2.  Anxiety Continues to have quite a bit of stress surrounding her mother.  She utilizes alprazolam extremely sparingly but does need a refill on this.  No reports of falls, dizziness or hallucinations.  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   Hypertension    IBS (irritable bowel syndrome)    Mixed   Multiple allergies    Recurrent upper respiratory infection (URI)    Social History   Socioeconomic History   Marital status: Widowed    Spouse name: Not on file   Number of children: 4   Years of education: Not on file   Highest education level: Some college, no degree  Occupational History   Occupation: Retired Nursing    Comment: Scientist, research (physical sciences) HH  Tobacco Use   Smoking status: Former    Packs/day: 0.50    Years: 3.00    Pack years: 1.50    Types: Cigarettes   Smokeless tobacco: Never   Tobacco comments:    Quit many years ago.  Vaping Use    Vaping Use: Never used  Substance and Sexual Activity   Alcohol use: No   Drug use: Never   Sexual activity: Not on file  Other Topics Concern   Not on file  Social History Narrative   Retired Materials engineer. 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 Lives alone. Enjoys crafting, puzzles, reading, and gardening.    Social Determinants of Health   Financial Resource Strain: Not on file  Food Insecurity: Not on file  Transportation Needs: Not on file  Physical Activity: Not on file  Stress: Not on file  Social Connections: Not on file  Intimate Partner Violence: Not on file   Past Surgical History:  Procedure Laterality Date   ABDOMINAL HYSTERECTOMY     CHOLECYSTECTOMY     SHOULDER SURGERY Left 2012   [facility]     Family History  Problem Relation Age of Onset   Heart failure Maternal Grandmother        CHF   Breast cancer Sister    Heart attack Paternal Grandfather        MI   Coronary artery disease Paternal Grandfather        At a premature age   Coronary artery disease Other    Colon cancer Other    Prostate cancer Other    Ovarian cancer Other    Asthma Mother    Thyroid disease  Mother    Heart disease Father    Hypertension Daughter    Allergic rhinitis Neg Hx    Angioedema Neg Hx    Atopy Neg Hx    Eczema Neg Hx    Immunodeficiency Neg Hx    Urticaria Neg Hx     Current Outpatient Medications:    albuterol (VENTOLIN HFA) 108 (90 Base) MCG/ACT inhaler, INHALE 2 PUFFS BY MOUTH EVERY 6 HOURS AS NEEDED FOR WHEEZING AND FOR SHORTNESS OF BREATH, Disp: 9 g, Rfl: 0   ALPRAZolam (XANAX) 0.5 MG tablet, Take 1 tablet (0.5 mg total) by mouth daily as needed for anxiety. Place on file. Doesn't need yet, Disp: 20 tablet, Rfl: 0   atorvastatin (LIPITOR) 40 MG tablet, Take 1 tablet (40 mg total) by mouth daily., Disp: 90 tablet, Rfl: 3   citalopram (CELEXA) 20 MG tablet, Take 1 tablet (20 mg total) by mouth daily., Disp: 90 tablet, Rfl: 3    fluticasone (FLONASE) 50 MCG/ACT nasal spray, Place 2 sprays into both nostrils daily., Disp: 16 g, Rfl: 6   Fluticasone-Umeclidin-Vilant (TRELEGY ELLIPTA) 200-62.5-25 MCG/INH AEPB, Inhale 1 puff into the lungs daily., Disp: 30 each, Rfl: 5   ipratropium (ATROVENT) 0.06 % nasal spray, every 8 hours as needed to help with drying out your nose, Disp: 15 mL, Rfl: 5   levocetirizine (XYZAL) 5 MG tablet, Take 1 tablet (5 mg total) by mouth every evening., Disp: 90 tablet, Rfl: 3   montelukast (SINGULAIR) 10 MG tablet, Take 1 tablet (10 mg total) by mouth at bedtime., Disp: 30 tablet, Rfl: 3   triamterene-hydrochlorothiazide (MAXZIDE-25) 37.5-25 MG tablet, Take 1 tablet by mouth daily., Disp: 90 tablet, Rfl: 3  Allergies  Allergen Reactions   Codeine     REACTION: vomiting   Sulfonamide Derivatives     REACTION: rash     ROS: Review of Systems Pertinent items noted in HPI and remainder of comprehensive ROS otherwise negative.    Physical exam BP (!) 150/82   Pulse 89   Temp (!) 97.4 F (36.3 C)   Ht 5' 5.5" (1.664 m)   Wt 163 lb 12.8 oz (74.3 kg)   SpO2 96%   BMI 26.84 kg/m  General appearance: alert, cooperative, appears stated age, and no distress Head: Normocephalic, without obvious abnormality, atraumatic Eyes: negative findings: lids and lashes normal, conjunctivae and sclerae normal, corneas clear, and pupils equal, round, reactive to light and accomodation Ears: normal TM's and external ear canals both ears Nose: Nares normal. Septum midline. Mucosa normal. No drainage or sinus tenderness. Throat: lips, mucosa, and tongue normal; teeth and gums normal Neck: no adenopathy, supple, symmetrical, trachea midline, and thyroid not enlarged, symmetric, no tenderness/mass/nodules Back: symmetric, no curvature. ROM normal. No CVA tenderness. Lungs:  Globally decreased breath sounds.  No wheezes, rhonchi or rales.  Normal work of breathing on room air Heart: regular rate and rhythm, S1,  S2 normal, no murmur, click, rub or gallop Abdomen: soft, non-tender; bowel sounds normal; no masses,  no organomegaly Extremities: extremities normal, atraumatic, no cyanosis or edema Pulses: 2+ and symmetric Skin: Skin color, texture, turgor normal. No rashes or lesions Lymph nodes: Cervical, supraclavicular, and axillary nodes normal. Neurologic: Grossly normal Psych: Mood stable.  Patient very pleasant and interactive.  Does not appear to be responding to internal stimuli. Depression screen [facility] 2/9 03/03/2021 08/05/2020 07/28/2020  Decreased Interest 0 0 0  Down, Depressed, Hopeless 0 0 0  PHQ - 2 Score 0 0 0  Altered  sleeping - 0 -  Tired, decreased energy - 0 -  Change in appetite - 0 -  Feeling bad or failure about yourself  - 0 -  Trouble concentrating - 0 -  Moving slowly or fidgety/restless - 0 -  Suicidal thoughts - 0 -  PHQ-9 Score - 0 -  Difficult doing work/chores - - -  Some recent data might be hidden      Assessment/ Plan: Laurina Bustle here for annual physical exam.   Annual physical exam  Essential hypertension, benign - Plan: CMP14+EGFR  Mixed hyperlipidemia - Plan: CMP14+EGFR, Lipid panel, TSH  Generalized anxiety disorder - Plan: Drug Screen 10 W/Conf, Se, ALPRAZolam (XANAX) 0.5 MG tablet  Controlled substance agreement signed - Plan: Drug Screen 10 W/Conf, Se  Seasonal allergic rhinitis, unspecified trigger - Plan: montelukast (SINGULAIR) 10 MG tablet, levocetirizine (XYZAL) 5 MG tablet, fluticasone (FLONASE) 50 MCG/ACT nasal spray, ipratropium (ATROVENT) 0.06 % nasal spray  Blood pressure is borderline.  CMP, lipid panel ordered.  She will come in for fasting labs at a later date  Anxiety disorder somewhat exacerbated but I think this is situational and relative to the struggle she has been having with her mother.  Alprazolam renewed.  Controlled substance contract completed.  Drug screen will be obtained with her fasting labs  Allergic rhinitis  somewhat controlled with current regimen.  Have given her samples of Breztri for her breathing issues.  She will contact me if she finds this to be helpful  Counseled on healthy lifestyle choices, including diet (rich in fruits, vegetables and lean meats and low in salt and simple carbohydrates) and exercise (at least 30 minutes of moderate physical activity daily).  Patient to follow up in 1 year for annual exam or sooner if needed.  Marquis Down M. Lajuana Ripple, DO

## 2021-03-06 ENCOUNTER — Other Ambulatory Visit: Payer: Medicare HMO

## 2021-03-06 ENCOUNTER — Other Ambulatory Visit: Payer: Self-pay

## 2021-03-06 DIAGNOSIS — Z79899 Other long term (current) drug therapy: Secondary | ICD-10-CM | POA: Diagnosis not present

## 2021-03-06 DIAGNOSIS — R69 Illness, unspecified: Secondary | ICD-10-CM | POA: Diagnosis not present

## 2021-03-06 DIAGNOSIS — I10 Essential (primary) hypertension: Secondary | ICD-10-CM

## 2021-03-06 DIAGNOSIS — E782 Mixed hyperlipidemia: Secondary | ICD-10-CM | POA: Diagnosis not present

## 2021-03-06 DIAGNOSIS — F411 Generalized anxiety disorder: Secondary | ICD-10-CM | POA: Diagnosis not present

## 2021-03-12 LAB — CMP14+EGFR
ALT: 12 IU/L (ref 0–32)
AST: 12 IU/L (ref 0–40)
Albumin/Globulin Ratio: 2.2 (ref 1.2–2.2)
Albumin: 4.7 g/dL (ref 3.8–4.8)
Alkaline Phosphatase: 90 IU/L (ref 44–121)
BUN/Creatinine Ratio: 12 (ref 12–28)
BUN: 8 mg/dL (ref 8–27)
Bilirubin Total: 0.6 mg/dL (ref 0.0–1.2)
CO2: 22 mmol/L (ref 20–29)
Calcium: 9.9 mg/dL (ref 8.7–10.3)
Chloride: 92 mmol/L — ABNORMAL LOW (ref 96–106)
Creatinine, Ser: 0.66 mg/dL (ref 0.57–1.00)
Globulin, Total: 2.1 g/dL (ref 1.5–4.5)
Glucose: 107 mg/dL — ABNORMAL HIGH (ref 65–99)
Potassium: 4.3 mmol/L (ref 3.5–5.2)
Sodium: 132 mmol/L — ABNORMAL LOW (ref 134–144)
Total Protein: 6.8 g/dL (ref 6.0–8.5)
eGFR: 94 mL/min/{1.73_m2} (ref >59)

## 2021-03-12 LAB — DRUG SCREEN 10 W/CONF, SERUM
Amphetamines, IA: NEGATIVE ng/mL
Barbiturates, IA: NEGATIVE ug/mL
Benzodiazepines, IA: NEGATIVE ng/mL
Cocaine & Metabolite, IA: NEGATIVE ng/mL
Methadone, IA: NEGATIVE ng/mL
Opiates, IA: NEGATIVE ng/mL
Oxycodones, IA: NEGATIVE ng/mL
Phencyclidine, IA: NEGATIVE ng/mL
Propoxyphene, IA: NEGATIVE ng/mL
THC(Marijuana) Metabolite, IA: NEGATIVE ng/mL

## 2021-03-12 LAB — LIPID PANEL
Chol/HDL Ratio: 3.4 ratio (ref 0.0–4.4)
Cholesterol, Total: 195 mg/dL (ref 100–199)
HDL: 57 mg/dL (ref >39)
LDL Chol Calc (NIH): 118 mg/dL — ABNORMAL HIGH (ref 0–99)
Triglycerides: 111 mg/dL (ref 0–149)
VLDL Cholesterol Cal: 20 mg/dL (ref 5–40)

## 2021-03-12 LAB — TSH: TSH: 2.15 u[IU]/mL (ref 0.450–4.500)

## 2021-03-25 ENCOUNTER — Ambulatory Visit: Payer: Medicare HMO | Admitting: Allergy & Immunology

## 2021-04-22 ENCOUNTER — Encounter: Payer: Self-pay | Admitting: Allergy & Immunology

## 2021-04-22 ENCOUNTER — Other Ambulatory Visit: Payer: Self-pay

## 2021-04-22 ENCOUNTER — Ambulatory Visit (INDEPENDENT_AMBULATORY_CARE_PROVIDER_SITE_OTHER): Payer: Medicare HMO | Admitting: Allergy & Immunology

## 2021-04-22 VITALS — BP 144/88 | HR 102 | Temp 98.8°F | Resp 16 | Ht 65.5 in | Wt 168.0 lb

## 2021-04-22 DIAGNOSIS — J449 Chronic obstructive pulmonary disease, unspecified: Secondary | ICD-10-CM | POA: Diagnosis not present

## 2021-04-22 DIAGNOSIS — J3089 Other allergic rhinitis: Secondary | ICD-10-CM | POA: Diagnosis not present

## 2021-04-22 DIAGNOSIS — J302 Other seasonal allergic rhinitis: Secondary | ICD-10-CM

## 2021-04-22 MED ORDER — PREDNISONE 10 MG PO TABS: ORAL_TABLET | ORAL | 0 refills | Status: DC

## 2021-04-22 MED ORDER — TRELEGY ELLIPTA 100-62.5-25 MCG/ACT IN AEPB: 1.0000 | INHALATION_SPRAY | Freq: Every day | RESPIRATORY_TRACT | 5 refills | Status: AC

## 2021-04-22 NOTE — Patient Instructions (Addendum)
1. Chronic rhinitis (grasses, trees and indoor molds) - We are going to start you on an extended course of prednisone.  - Try getting a NeilMed bottle to help clear the sinuses. - Strongly consider doing allergy shots.  - Continue with  Zyrtec (cetirizine) 10mg  tablet once daily and Flonase (fluticasone) one spray per nostril daily  - Continue with nasal ipratropium every 8 hours as needed to help with drying out your nose.  - Continue with nasal saline rinses 1-2 times daily to remove allergens from the nasal cavities as well as help with mucous clearance (this is especially helpful to do before the nasal sprays are given)   2. Mild persistent asthma, uncomplicated - Lung testing looked stable.  - We are going to put you back on the Trelegy since you need to be on some kind of daily controller medication. - Daily controller medication(s): Trelegy 100/62.5/25 one puff once daily - Prior to physical activity: albuterol 2 puffs 10-15 minutes before physical activity. - Rescue medications: albuterol 4 puffs every 4-6 hours as needed - Asthma control goals:  * Full participation in all desired activities (may need albuterol before activity) * Albuterol use two time or less a week on average (not counting use with activity) * Cough interfering with sleep two time or less a month * Oral steroids no more than once a year * No hospitalizations  3. Return in about 3 months (around 07/23/2021).    Please inform 07/25/2021 of any Emergency Department visits, hospitalizations, or changes in symptoms. Call us before going to the ED for breathing or allergy symptoms since we might be able to fit you in for a sick visit. Feel free to contact us anytime with any questions, problems, or concerns.  It was a pleasure to see you again today!  Websites that have reliable patient information: 1. American Academy of Asthma, Allergy, and Immunology: www.aaaai.org 2. Food Allergy Research and Education (FARE):  foodallergy.org 3. Mothers of Asthmatics: http://www.asthmacommunitynetwork.org 4. American College of Allergy, Asthma, and Immunology: www.acaai.org   COVID-19 Vaccine Information can be found at: [place] For questions related to vaccine distribution or appointments, please email [email or web address] or call [phone].     "Like" [phone] on Facebook and Instagram for our latest updates!     HAPPY FALL!     Make sure you are registered to vote! If you have moved or changed any of your contact information, you will need to get this updated before voting!  In some cases, you MAY be able to register to vote online: [place]

## 2021-04-22 NOTE — Addendum Note (Signed)
Addended by: Areta Haber B on: 04/22/2021 04:48 PM   Modules accepted: Orders

## 2021-04-22 NOTE — Progress Notes (Signed)
FOLLOW UP  Date of Service/Encounter:  04/22/21   Assessment:   Asthma COPD overlap syndrome   Persistent restrictive lung disease - not responsive to prednisone   Seasonal and perennial allergic rhinitis (grasses, trees and indoor molds)    Plan/Recommendations:   1. Chronic rhinitis (grasses, trees and indoor molds) - We are going to start you on an extended course of prednisone.  - Try getting a NeilMed bottle to help clear the sinuses. - Strongly consider doing allergy shots.  - Continue with  Zyrtec (cetirizine) 10mg  tablet once daily and Flonase (fluticasone) one spray per nostril daily  - Continue with nasal ipratropium every 8 hours as needed to help with drying out your nose.  - Continue with nasal saline rinses 1-2 times daily to remove allergens from the nasal cavities as well as help with mucous clearance (this is especially helpful to do before the nasal sprays are given)   2. Mild persistent asthma, uncomplicated - Lung testing looked stable.  - We are going to put you back on the Trelegy since you need to be on some kind of daily controller medication. - Daily controller medication(s): Trelegy 100/62.5/25 one puff once daily - Prior to physical activity: albuterol 2 puffs 10-15 minutes before physical activity. - Rescue medications: albuterol 4 puffs every 4-6 hours as needed - Asthma control goals:  * Full participation in all desired activities (may need albuterol before activity) * Albuterol use two time or less a week on average (not counting use with activity) * Cough interfering with sleep two time or less a month * Oral steroids no more than once a year * No hospitalizations  3. Return in about 3 months (around 07/23/2021).    Subjective:   Misty Clark is a 70 y.o. female presenting today for follow up of  Chief Complaint  Patient presents with   Follow-up    Patient is having trouble with breathing, cough, headache and stuffy nose.     Brandin Dilday has a history of the following: Patient Active Problem List   Diagnosis Date Noted   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   Seasonal and perennial allergic rhinitis 05/14/2020   Mixed hyperlipidemia 12/26/2017   Seasonal allergic rhinitis due to pollen 11/18/2017   Generalized anxiety disorder 06/03/2008   Essential hypertension, benign 06/03/2008   GERD 06/03/2008   Irritable bowel syndrome 06/03/2008   Nonspecific abnormal results of cardiovascular function study 06/03/2008    History obtained from: chart review and patient.  Misty Clark is a 70 y.o. female presenting for a follow up visit.  She was last seen in November 2021.  At that time,, her lung testing continues to look a little bit better, but was still in the 30% range.  We will continue with Trelegy 1 puff once daily as well as albuterol as needed.  For her rhinitis, we continued with Zyrtec as well as Flonase and nasal ipratropium.  In the interim, her father passed away in 02-Oct-2020. Hr mother has been in poor health as well. She has been trying to manage her mother's health from afar.   Asthma/Respiratory Symptom History: She is no longer on her Trelegy.  She is now on the [facility] but she feels that her muscle were aching more and she was coughing more. She is unsure why she was changed from Trelegy. She was not coughing with that at all.   Allergic Rhinitis Symptom History: She has had a head full of drainage. She  has tried a number of combinations. She was on cetirizine and Sudafed in the morning. She has been on Xyzal and Singulair at bedtime. She is doing all of her nose sprays as well. This is the worst that she has ever been.  She remains interested in allergen immunotherapy.   Otherwise, there have been no changes to her past medical history, surgical history, family history, or social history.    Review of Systems  Constitutional: Negative.  Negative for chills, fever, malaise/fatigue and  weight loss.  HENT:  Positive for congestion and sinus pain. Negative for ear discharge and ear pain.   Eyes:  Negative for pain, discharge and redness.  Respiratory:  Negative for cough, sputum production, shortness of breath and wheezing.   Cardiovascular: Negative.  Negative for chest pain and palpitations.  Gastrointestinal:  Negative for abdominal pain, constipation, diarrhea, heartburn, nausea and vomiting.  Skin: Negative.  Negative for itching and rash.  Neurological:  Negative for dizziness and headaches.  Endo/Heme/Allergies:  Positive for environmental allergies. Does not bruise/bleed easily.      Objective:   Blood pressure (!) 144/88, pulse (!) 102, temperature 98.8 F (37.1 C), temperature source Temporal, resp. rate 16, height 5' 5.5" (1.664 m), weight 168 lb (76.2 kg), SpO2 97 %. Body mass index is 27.53 kg/m.   Physical Exam:  Physical Exam Vitals reviewed.  Constitutional:      Appearance: She is well-developed.  HENT:     Head: Normocephalic and atraumatic.     Right Ear: Tympanic membrane, ear canal and external ear normal.     Left Ear: Tympanic membrane, ear canal and external ear normal.     Nose: No nasal deformity, septal deviation, mucosal edema or rhinorrhea.     Right Turbinates: Enlarged, swollen and pale.     Left Turbinates: Enlarged, swollen and pale.     Right Sinus: Maxillary sinus tenderness present. No frontal sinus tenderness.     Left Sinus: Maxillary sinus tenderness present. No frontal sinus tenderness.     Comments: No polyps. Bilateral maxillary sinus tenderness.    Mouth/Throat:     Mouth: Mucous membranes are not pale and not dry.     Pharynx: Uvula midline.  Eyes:     General: Lids are normal. No allergic shiner.       Right eye: No discharge.        Left eye: No discharge.     Conjunctiva/sclera: Conjunctivae normal.     Right eye: Right conjunctiva is not injected. No chemosis.    Left eye: Left conjunctiva is not injected.  No chemosis.    Pupils: Pupils are equal, round, and reactive to light.  Cardiovascular:     Rate and Rhythm: Normal rate and regular rhythm.     Heart sounds: Normal heart sounds.  Pulmonary:     Effort: Pulmonary effort is normal. No tachypnea, accessory muscle usage or respiratory distress.     Breath sounds: Normal breath sounds. No wheezing, rhonchi or rales.     Comments: Decreased air movement at the bases.  He does have some faint expiratory wheezes most prominent in the right lung fields.  Chest:     Chest wall: No tenderness.  Lymphadenopathy:     Cervical: No cervical adenopathy.  Skin:    General: Skin is warm.     Capillary Refill: Capillary refill takes less than 2 seconds.     Coloration: Skin is not pale.     Findings: No abrasion, erythema,  petechiae or rash. Rash is not papular, urticarial or vesicular.  Neurological:     Mental Status: She is alert.  Psychiatric:        Behavior: Behavior is cooperative.     Diagnostic studies:    Spirometry: results abnormal (FEV1: 0.64/28%, FVC: 1.01/34%, FEV1/FVC: 63%).    Spirometry consistent with mixed obstructive and restrictive disease.   Allergy Studies: none        Malachi Bonds, MD  [facility]

## 2021-05-18 ENCOUNTER — Ambulatory Visit (INDEPENDENT_AMBULATORY_CARE_PROVIDER_SITE_OTHER): Payer: Medicare HMO | Admitting: Family Medicine

## 2021-05-18 ENCOUNTER — Encounter: Payer: Self-pay | Admitting: Family Medicine

## 2021-05-18 DIAGNOSIS — J0101 Acute recurrent maxillary sinusitis: Secondary | ICD-10-CM

## 2021-05-18 MED ORDER — AMOXICILLIN 875 MG PO TABS: 875.0000 mg | ORAL_TABLET | Freq: Two times a day (BID) | ORAL | 0 refills | Status: AC

## 2021-05-18 NOTE — Progress Notes (Signed)
   Virtual Visit  Note Due to COVID-19 pandemic this visit was conducted virtually. This visit type was conducted due to national recommendations for restrictions regarding the COVID-19 Pandemic (e.g. social distancing, sheltering in place) in an effort to limit this patient's exposure and mitigate transmission in our community. All issues noted in this document were discussed and addressed.  A physical exam was not performed with this format.  I connected with Misty Clark on 05/18/21 at 1318 by telephone and verified that I am speaking with the correct person using two identifiers. Misty Clark is currently located at home and no one is currently with her during the visit. The provider, Gabriel Earing, FNP is located in their office at time of visit.  I discussed the limitations, risks, security and privacy concerns of performing an evaluation and management service by telephone and the availability of in person appointments. I also discussed with the patient that there may be a patient responsible charge related to this service. The patient expressed understanding and agreed to proceed.  CC: sinusitis  History and Present Illness:  HPI Misty Clark reports a history of chronic rhinitis due to allergies. Yesterday she had a significant increased in congestion with tenderness and pressure over her maxillary and frontal sinuses. She takes zyrtec, xyzal, singular, flonase, sudafed, and nasal saline for her allergies. She recently had a round of steroids from her allergist. She denies cough, fever, body aches, chills, nausea, vomiting, diarrhea, shortness of breath, and chest pain. She reports a history of recurrent sinusitis requiring abx for treatment.      ROS As per HPI.   Observations/Objective: Alert and oriented x 3. Able to speak in full sentences without difficulty.   Assessment and Plan: Misty Clark was seen today for sinusitis.  Diagnoses and all orders for this visit:  Acute  recurrent maxillary sinusitis Amoxicillin as below. Continue allergy regimen. Return to office for new or worsening symptoms, or if symptoms persist.  -     amoxicillin (AMOXIL) 875 MG tablet; Take 1 tablet (875 mg total) by mouth 2 (two) times daily for 10 days.    Follow Up Instructions: As needed.     I discussed the assessment and treatment plan with the patient. The patient was provided an opportunity to ask questions and all were answered. The patient agreed with the plan and demonstrated an understanding of the instructions.   The patient was advised to call back or seek an in-person evaluation if the symptoms worsen or if the condition fails to improve as anticipated.  The above assessment and management plan was discussed with the patient. The patient verbalized understanding of and has agreed to the management plan. Patient is aware to call the clinic if symptoms persist or worsen. Patient is aware when to return to the clinic for a follow-up visit. Patient educated on when it is appropriate to go to the emergency department.   Time call ended:  1330  I provided 12 minutes of  non face-to-face time during this encounter.    Gabriel Earing, FNP

## 2021-07-10 ENCOUNTER — Encounter: Payer: Self-pay | Admitting: Family Medicine

## 2021-07-10 ENCOUNTER — Ambulatory Visit (INDEPENDENT_AMBULATORY_CARE_PROVIDER_SITE_OTHER): Payer: Medicare HMO | Admitting: Family Medicine

## 2021-07-10 DIAGNOSIS — R3989 Other symptoms and signs involving the genitourinary system: Secondary | ICD-10-CM

## 2021-07-10 MED ORDER — CEFDINIR 300 MG PO CAPS: 300.0000 mg | ORAL_CAPSULE | Freq: Two times a day (BID) | ORAL | 0 refills | Status: DC

## 2021-07-10 NOTE — Progress Notes (Signed)
Virtual Visit via Telephone Note  I connected with Misty Clark on 07/10/21 at 1:10 PM by telephone and verified that I am speaking with the correct person using two identifiers. Misty Clark is currently located at home and her granddaughter is currently with her during this visit. The provider, Gwenlyn Fudge, FNP is located in their office at time of visit.  I discussed the limitations, risks, security and privacy concerns of performing an evaluation and management service by telephone and the availability of in person appointments. I also discussed with the patient that there may be a patient responsible charge related to this service. The patient expressed understanding and agreed to proceed.  Subjective: PCP: Raliegh Ip, DO  Chief Complaint  Patient presents with   Urinary Tract Infection   Patient complains of dysuria, frequency, and urgency. She has had symptoms for 1 day. Patient denies fever. Patient does have a history of recurrent UTI.  Patient does not have a history of pyelonephritis. Drinking lots of cranberry juice and water.   ROS: Per HPI  Current Outpatient Medications:    albuterol (VENTOLIN HFA) 108 (90 Base) MCG/ACT inhaler, INHALE 2 PUFFS BY MOUTH EVERY 6 HOURS AS NEEDED FOR WHEEZING AND FOR SHORTNESS OF BREATH, Disp: 9 g, Rfl: 0   ALPRAZolam (XANAX) 0.5 MG tablet, Take 1 tablet (0.5 mg total) by mouth daily as needed for anxiety. Place on file. Doesn't need yet, Disp: 20 tablet, Rfl: 1   atorvastatin (LIPITOR) 40 MG tablet, Take 1 tablet (40 mg total) by mouth daily., Disp: 90 tablet, Rfl: 3   citalopram (CELEXA) 20 MG tablet, Take 1 tablet (20 mg total) by mouth daily., Disp: 90 tablet, Rfl: 3   fluticasone (FLONASE) 50 MCG/ACT nasal spray, Place 2 sprays into both nostrils daily., Disp: 48 g, Rfl: 3   ipratropium (ATROVENT) 0.06 % nasal spray, Place 1 spray into both nostrils 3 (three) times daily. 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 Suspected UTI - cefdinir (OMNICEF) 300 MG capsule; Take 1 capsule (300 mg total) by mouth 2 (two) times daily for 7 days.  Dispense: 14 capsule; Refill: 0 - Urinalysis, Routine w reflex microscopic; Future - Urine Culture; Future   Follow Up Instructions:  I discussed the assessment and treatment plan with the patient. The patient was provided an opportunity to ask questions and all were answered. The patient agreed with the plan and demonstrated an understanding of the instructions.   The patient was advised to call back or seek an in-person evaluation if the symptoms  worsen or if the condition fails to improve as anticipated.  The above assessment and management plan was discussed with the patient. The patient verbalized understanding of and has agreed to the management plan. Patient is aware to call the clinic if symptoms persist or worsen. Patient is aware when to return to the clinic for a follow-up visit. Patient educated on when it is appropriate to go to the emergency department.   Time call ended: 1:21 PM  I provided 11 minutes of non-face-to-face time during this encounter.  Deliah Boston,  MSN, APRN, FNP-C [facility] 07/10/21

## 2021-07-12 ENCOUNTER — Other Ambulatory Visit: Payer: Self-pay | Admitting: Family Medicine

## 2021-07-12 DIAGNOSIS — R3989 Other symptoms and signs involving the genitourinary system: Secondary | ICD-10-CM

## 2021-07-13 ENCOUNTER — Other Ambulatory Visit: Payer: Self-pay | Admitting: Family Medicine

## 2021-07-13 DIAGNOSIS — R3989 Other symptoms and signs involving the genitourinary system: Secondary | ICD-10-CM

## 2021-07-13 MED ORDER — CEPHALEXIN 500 MG PO CAPS: 500.0000 mg | ORAL_CAPSULE | Freq: Two times a day (BID) | ORAL | 0 refills | Status: AC

## 2021-07-24 ENCOUNTER — Ambulatory Visit: Payer: Medicare HMO | Admitting: Allergy & Immunology

## 2021-08-21 ENCOUNTER — Other Ambulatory Visit: Payer: Self-pay | Admitting: Family Medicine

## 2021-08-26 ENCOUNTER — Ambulatory Visit: Payer: Medicare HMO | Admitting: Allergy & Immunology

## 2021-08-30 ENCOUNTER — Other Ambulatory Visit: Payer: Self-pay | Admitting: Family Medicine

## 2021-08-30 DIAGNOSIS — F411 Generalized anxiety disorder: Secondary | ICD-10-CM

## 2021-08-31 ENCOUNTER — Encounter: Payer: Self-pay | Admitting: Family

## 2021-08-31 ENCOUNTER — Ambulatory Visit (INDEPENDENT_AMBULATORY_CARE_PROVIDER_SITE_OTHER): Payer: Medicare HMO | Admitting: Family

## 2021-08-31 DIAGNOSIS — J4520 Mild intermittent asthma, uncomplicated: Secondary | ICD-10-CM

## 2021-08-31 DIAGNOSIS — J019 Acute sinusitis, unspecified: Secondary | ICD-10-CM | POA: Diagnosis not present

## 2021-08-31 MED ORDER — AMOXICILLIN-POT CLAVULANATE 875-125 MG PO TABS: 1.0000 | ORAL_TABLET | Freq: Two times a day (BID) | ORAL | 0 refills | Status: DC

## 2021-08-31 MED ORDER — PREDNISONE 10 MG (21) PO TBPK: ORAL_TABLET | ORAL | 0 refills | Status: DC

## 2021-08-31 MED ORDER — DOXYCYCLINE HYCLATE 100 MG PO TABS: 100.0000 mg | ORAL_TABLET | Freq: Two times a day (BID) | ORAL | 0 refills | Status: DC

## 2021-08-31 NOTE — Progress Notes (Signed)
Virtual Visit  Note Due to COVID-19 pandemic this visit was conducted virtually. This visit type was conducted due to national recommendations for restrictions regarding the COVID-19 Pandemic (e.g. social distancing, sheltering in place) in an effort to limit this patient's exposure and mitigate transmission in our community. All issues noted in this document were discussed and addressed.  A physical exam was not performed with this format.  I connected with Misty Clark on 08/31/21 at 9:23 AM by telephone and verified that I am speaking with the correct person using two identifiers. Misty Clark is currently located at home and no one is currently with her during visit. The provider, Jannifer Rodney, FNP is located in their office at time of visit.  I discussed the limitations, risks, security and privacy concerns of performing an evaluation and management service by telephone and the availability of in person appointments. I also discussed with the patient that there may be a patient responsible charge related to this service. The patient expressed understanding and agreed to proceed.  Misty Clark, Misty Clark are scheduled for a virtual visit with your provider today.    Just as we do with appointments in the office, we must obtain your consent to participate.  Your consent will be active for this visit and any virtual visit you may have with one of our providers in the next 365 days.    If you have a MyChart account, I can also send a copy of this consent to you electronically.  All virtual visits are billed to your insurance company just like a traditional visit in the office.  As this is a virtual visit, video technology does not allow for your provider to perform a traditional examination.  This may limit your provider's ability to fully assess your condition.  If your provider identifies any concerns that need to be evaluated in person or the need to arrange testing such as labs, EKG, etc, we will  make arrangements to do so.    Although advances in technology are sophisticated, we cannot ensure that it will always work on either your end or our end.  If the connection with a video visit is poor, we may have to switch to a telephone visit.  With either a video or telephone visit, we are not always able to ensure that we have a secure connection.   I need to obtain your verbal consent now.   Are you willing to proceed with your visit today?   Misty Clark has provided verbal consent on 08/31/2021 for a virtual visit (video or telephone).   Jannifer Rodney, [place] 08/31/2021  9:26 AM    History and Present Illness:  Sinus Problem This is a new problem. The current episode started in the past 7 days. The problem has been gradually worsening since onset. Maximum temperature: 99. Her pain is at a severity of 8/10. The pain is moderate. Associated symptoms include congestion, coughing, ear pain (left), headaches, sinus pressure and sneezing. Pertinent negatives include no chills, hoarse voice or sore throat. Past treatments include oral decongestants and acetaminophen. The treatment provided mild relief.  Asthma She complains of cough and wheezing. There is no hoarse voice. The current episode started more than 1 year ago. Associated symptoms include ear pain (left), headaches and sneezing. Pertinent negatives include no sore throat. Her past medical history is significant for asthma.     Review of Systems  Constitutional:  Negative for chills.  HENT:  Positive for congestion, ear  pain (left), sinus pressure and sneezing. Negative for hoarse voice and sore throat.   Respiratory:  Positive for cough and wheezing.   Neurological:  Positive for headaches.    Observations/Objective: No SOB or distress noted   Assessment and Plan: No SOB or distress noted, nasal congestion  Follow Up Instructions: 1. Acute sinusitis, recurrence not specified, unspecified location - Take meds as  prescribed - Use a cool mist humidifier  -Use saline nose sprays frequently -Force fluids -For any cough or congestion  Use plain Mucinex- regular strength or max strength is fine -For fever or aces or pains- take tylenol or ibuprofen. -Throat lozenges if help -Start prednisone Will hold off on doxycycline for 2 days and if symptoms worsen she will start - doxycycline (VIBRA-TABS) 100 MG tablet; Take 1 tablet (100 mg total) by mouth 2 (two) times daily.  Dispense: 20 tablet; Refill: 0 - predniSONE (STERAPRED UNI-PAK 21 TAB) 10 MG (21) TBPK tablet; Use as directed  Dispense: 21 tablet; Refill: 0  2. Mild intermittent asthma without complication - predniSONE (STERAPRED UNI-PAK 21 TAB) 10 MG (21) TBPK tablet; Use as directed  Dispense: 21 tablet; Refill: 0     I discussed the assessment and treatment plan with the patient. The patient was provided an opportunity to ask questions and all were answered. The patient agreed with the plan and demonstrated an understanding of the instructions.   The patient was advised to call back or seek an in-person evaluation if the symptoms worsen or if the condition fails to improve as anticipated.  The above assessment and management plan was discussed with the patient. The patient verbalized understanding of and has agreed to the management plan. Patient is aware to call the clinic if symptoms persist or worsen. Patient is aware when to return to the clinic for a follow-up visit. Patient educated on when it is appropriate to go to the emergency department.   Time call ended:  9:33 AM   I provided 11 minutes of  non face-to-face time during this encounter.    Jannifer Rodney, FNP

## 2021-09-10 ENCOUNTER — Other Ambulatory Visit: Payer: Self-pay

## 2021-09-10 ENCOUNTER — Ambulatory Visit: Payer: Medicare HMO | Admitting: Allergy & Immunology

## 2021-09-10 VITALS — BP 150/92 | HR 104 | Temp 97.2°F | Resp 20 | Ht 65.5 in | Wt 165.8 lb

## 2021-09-10 DIAGNOSIS — J449 Chronic obstructive pulmonary disease, unspecified: Secondary | ICD-10-CM | POA: Diagnosis not present

## 2021-09-10 DIAGNOSIS — J3089 Other allergic rhinitis: Secondary | ICD-10-CM

## 2021-09-10 DIAGNOSIS — J984 Other disorders of lung: Secondary | ICD-10-CM | POA: Diagnosis not present

## 2021-09-10 DIAGNOSIS — J302 Other seasonal allergic rhinitis: Secondary | ICD-10-CM

## 2021-09-10 MED ORDER — TRELEGY ELLIPTA 200-62.5-25 MCG/ACT IN AEPB: 1.0000 | INHALATION_SPRAY | Freq: Every day | RESPIRATORY_TRACT | 5 refills | Status: AC

## 2021-09-10 NOTE — Patient Instructions (Addendum)
1. Chronic rhinitis (grasses, trees and indoor molds) ?- Consider doing allergy shots.  ?- Continue with  Zyrtec (cetirizine) 10mg  tablet once daily and Singulair (montelukast) 10mg  daily and Flonase (fluticasone) one spray per nostril daily  ?- Continue with nasal ipratropium every 8 hours as needed to help with drying out your nose.  ?- Continue with nasal saline rinses 1-2 times daily to remove allergens from the nasal cavities as well as help with mucous clearance (this is especially helpful to do before the nasal sprays are given)  ? ?2. Mild persistent asthma, uncomplicated ?- Lung testing looked better today.  ?- We are going to put you back on the Trelegy since you need to be on some kind of daily controller medication. ?- Daily controller medication(s): Trelegy 200/62.5/25 one puff once daily + Singulair (montelukast) 10mg  ?- Prior to physical activity: albuterol 2 puffs 10-15 minutes before physical activity. ?- Rescue medications: albuterol 4 puffs every 4-6 hours as needed ?- Asthma control goals:  ?* Full participation in all desired activities (may need albuterol before activity) ?* Albuterol use two time or less a week on average (not counting use with activity) ?* Cough interfering with sleep two time or less a month ?* Oral steroids no more than once a year ?* No hospitalizations ? ?3. Return in about 3 months (around 12/11/2021).  ? ? ?Please inform 01-09-1999 of any Emergency Department visits, hospitalizations, or changes in symptoms. Call before going to the ED for breathing or allergy symptoms since we might be able to fit you in for a sick visit. Feel free to contact 02/10/2022 anytime with any questions, problems, or concerns. ? ?It was a pleasure to see you again today! ? ?Websites that have reliable patient information: ?1. American Academy of Asthma, Allergy, and Immunology: www.aaaai.org ?2. Food Allergy Research and Education (FARE): foodallergy.org ?3. Mothers of Asthmatics:  http://www.asthmacommunitynetwork.org ?4. [place] of Allergy, Asthma, and Immunology: [place] ? ? ?COVID-19 Vaccine Information can be found at: [place] For questions related to vaccine distribution or appointments, please email [email or web address] or call [phone].  ? ?We realize that you might be concerned about having an allergic reaction to the COVID19 vaccines. To help with that concern, WE ARE OFFERING THE COVID19 VACCINES IN OUR OFFICE! Ask the front desk for dates!  ? ? ? ??Like? [email or web address] on Facebook and Instagram for our latest updates!  ?  ? ? ?A healthy democracy works best when [email or web address] participate! Make sure you are registered to vote! If you have moved or changed any of your contact information, you will need to get this updated before voting! ? ?In some cases, you MAY be able to register to vote online: [phone] ? ? ? ? ? ? ? ? ? ?

## 2021-09-10 NOTE — Progress Notes (Signed)
? ?FOLLOW UP ? ?Date of Service/Encounter:  09/10/21 ? ? ?Assessment:  ? ?Asthma COPD overlap syndrome - with remote history of smoking for 3 to 4 years back in high school ?  ?Persistent restrictive lung disease - not responsive to prednisone ?  ?Seasonal and perennial allergic rhinitis (grasses, trees and indoor molds) - with new concern for pet exposure  ?  ? ?Plan/Recommendations:  ? ?1. Chronic rhinitis (grasses, trees and indoor molds) ?- Consider doing allergy shots.  ?- Continue with  Zyrtec (cetirizine) 10mg  tablet once daily and Singulair (montelukast) 10mg  daily and Flonase (fluticasone) one spray per nostril daily  ?- Continue with nasal ipratropium every 8 hours as needed to help with drying out your nose.  ?- Continue with nasal saline rinses 1-2 times daily to remove allergens from the nasal cavities as well as help with mucous clearance (this is especially helpful to do before the nasal sprays are given)  ? ?2. Mild persistent asthma, uncomplicated ?- Lung testing looked better today.  ?- We are going to put you back on the Trelegy since you need to be on some kind of daily controller medication. ?- Daily controller medication(s): Trelegy 200/62.5/25 one puff once daily + Singulair (montelukast) 10mg  ?- Prior to physical activity: albuterol 2 puffs 10-15 minutes before physical activity. ?- Rescue medications: albuterol 4 puffs every 4-6 hours as needed ?- Asthma control goals:  ?* Full participation in all desired activities (may need albuterol before activity) ?* Albuterol use two time or less a week on average (not counting use with activity) ?* Cough interfering with sleep two time or less a month ?* Oral steroids no more than once a year ?* No hospitalizations ? ?3. Return in about 3 months (around 12/11/2021).  ? ? ? ?Subjective:  ? ?Misty Clark is a 71 y.o. female presenting today for follow up of  ?Chief Complaint  ?Patient presents with  ? Asthma  ?  3 mth f/u - Past 2 wks Sxs: head  congestion; nasal congestion-clear, yellow Rx Abx Prednisone and Doxycyline by PCP.  Patient states she's feeling better now.  ? ? ?Misty Clark has a history of the following: ?Patient Active Problem List  ? Diagnosis Date Noted  ? Asthma-COPD overlap syndrome ([place]) 05/14/2020  ? Seasonal and perennial allergic rhinitis 05/14/2020  ? Mixed hyperlipidemia 12/26/2017  ? Seasonal allergic rhinitis due to pollen 11/18/2017  ? Generalized anxiety disorder 06/03/2008  ? Essential hypertension, benign 06/03/2008  ? GERD 06/03/2008  ? Irritable bowel syndrome 06/03/2008  ? Nonspecific abnormal results of cardiovascular function study 06/03/2008  ? ? ?History obtained from: chart review and patient. ? ?Misty Clark is a 71 y.o. female presenting for a follow up visit.  We last saw her in October 2022.  At that time, we started her on an extended course of prednisone.  Recommended getting a NeilMed bottle to help clear her sinuses.  Continue with Zyrtec and Flonase and strongly encouraged allergen immunotherapy.  For her asthma, we continue with Trelegy 1 puff once daily.  We recommended that she take it every day and emphasized to her the need to do so.  We continue with albuterol as needed.  She was supposed to follow-up in 3 months but presents now 5 months later.  We ? ?She has had a rough couple of weeks with her sinuses. She was placed on prednisone and doxycycline. It is not clear but she continues to cough.  ? ?She has had some issues  since losing her Dad one year ago. Her mother has bene there off and on for a period of time due to some thyroid issues. She is still in [place] so that has been hard to manage from [place].  ? ?Asthma/Respiratory Symptom History: She thinks that the Trelegy has helped.  Her breathing overall has been much better.  She has not been using her albuterol too often.  She is able to do more from an activity standpoint. She has not been on prednisone since we saw her last. She has not been to the  hospital nor she been to urgent care ? ?Of note, we have tried to send her to pulmonology but she no showed.  This was in November 2021. ? ?Allergic Rhinitis Symptom History: She has been doing  cetirizine and Sudafed in the morning and Singulair and Xyzal at bedtime.  She is interested in starting allergen immunotherapy for long-term control.  She lives in [place], but she comes to [place] office because she has a new great granddaughter.  She would like to start allergen immunotherapy. ? ?Otherwise, there have been no changes to her past medical history, surgical history, family history, or social history. ? ? ? ?Review of Systems  ?Constitutional: Negative.  Negative for chills, fever, malaise/fatigue and weight loss.  ?HENT:  Positive for congestion and sinus pain. Negative for ear discharge and ear pain.   ?Eyes:  Negative for pain, discharge and redness.  ?Respiratory:  Negative for cough, sputum production, shortness of breath and wheezing.   ?Cardiovascular: Negative.  Negative for chest pain and palpitations.  ?Gastrointestinal:  Negative for abdominal pain, constipation, diarrhea, heartburn, nausea and vomiting.  ?Skin: Negative.  Negative for itching and rash.  ?Neurological:  Negative for dizziness and headaches.  ?Endo/Heme/Allergies:  Positive for environmental allergies. Does not bruise/bleed easily.   ? ? ? ?Objective:  ? ?Blood pressure (!) 150/92, pulse (!) 104, temperature (!) 97.2 ?F (36.2 ?C), resp. rate 20, height 5' 5.5" (1.664 m), weight 165 lb 12.8 oz (75.2 kg), SpO2 96 %. ?Body mass index is 27.17 kg/m?. ? ? ? ?Physical Exam ?Vitals reviewed.  ?Constitutional:   ?   Appearance: She is well-developed.  ?   Comments: Somewhat hoarse today.  ?HENT:  ?   Head: Normocephalic and atraumatic.  ?   Right Ear: Tympanic membrane, ear canal and external ear normal.  ?   Left Ear: Tympanic membrane, ear canal and external ear normal.  ?   Nose: No nasal deformity, septal deviation, mucosal edema  or rhinorrhea.  ?   Right Turbinates: Enlarged, swollen and pale.  ?   Left Turbinates: Enlarged, swollen and pale.  ?   Right Sinus: No maxillary sinus tenderness or frontal sinus tenderness.  ?   Left Sinus: No maxillary sinus tenderness or frontal sinus tenderness.  ?   Comments: No polyps.  Some dried rhinorrhea. ?   Mouth/Throat:  ?   Mouth: Mucous membranes are not pale and not dry.  ?   Pharynx: Uvula midline.  ?Eyes:  ?   General: Lids are normal. No allergic shiner.    ?   Right eye: No discharge.     ?   Left eye: No discharge.  ?   Conjunctiva/sclera: Conjunctivae normal.  ?   Right eye: Right conjunctiva is not injected. No chemosis. ?   Left eye: Left conjunctiva is not injected. No chemosis. ?   Pupils: Pupils are equal, round, and reactive to light.  ?Cardiovascular:  ?  Rate and Rhythm: Normal rate and regular rhythm.  ?   Heart sounds: Normal heart sounds.  ?Pulmonary:  ?   Effort: Pulmonary effort is normal. No tachypnea, accessory muscle usage or respiratory distress.  ?   Breath sounds: Normal breath sounds. No wheezing, rhonchi or rales.  ?   Comments: Decreased air movement at the bases.  ?Chest:  ?   Chest wall: No tenderness.  ?Lymphadenopathy:  ?   Cervical: No cervical adenopathy.  ?Skin: ?   General: Skin is warm.  ?   Capillary Refill: Capillary refill takes less than 2 seconds.  ?   Coloration: Skin is not pale.  ?   Findings: No abrasion, erythema, petechiae or rash. Rash is not papular, urticarial or vesicular.  ?Neurological:  ?   Mental Status: She is alert.  ?Psychiatric:     ?   Behavior: Behavior is cooperative.  ?  ? ?Diagnostic studies:  ? ?Spirometry: results abnormal (FEV1: 0.88/34%, FVC: 1.55/46%, FEV1/FVC: 57%).  ?  ?Spirometry consistent with mixed obstructive and restrictive disease.  Overall, this is better than we have seen her in the past. ? ?Allergy Studies: none ? ? ? ? ?  ?Salvatore Marvel, MD  ?[facility] ? ? ? ? ? ? ?

## 2021-09-11 ENCOUNTER — Encounter: Payer: Self-pay | Admitting: Allergy & Immunology

## 2021-09-12 LAB — ALLERGENS W/COMP RFLX AREA 2
Alternaria Alternata IgE: <0.1 kU/L
Aspergillus Fumigatus IgE: <0.1 kU/L
Bermuda Grass IgE: <0.1 kU/L
Cedar, Mountain IgE: <0.1 kU/L
Cladosporium Herbarum IgE: <0.1 kU/L
Cockroach, German IgE: <0.1 kU/L
Common Silver Birch IgE: <0.1 kU/L
Cottonwood IgE: <0.1 kU/L
D Farinae IgE: <0.1 kU/L
D Pteronyssinus IgE: <0.1 kU/L
E001-IgE Cat Dander: <0.1 kU/L
E005-IgE Dog Dander: <0.1 kU/L
Elm, American IgE: <0.1 kU/L
IgE (Immunoglobulin E), Serum: 5 IU/mL — ABNORMAL LOW (ref 6–495)
Johnson Grass IgE: <0.1 kU/L
Maple/Box Elder IgE: <0.1 kU/L
Mouse Urine IgE: <0.1 kU/L
Oak, White IgE: <0.1 kU/L
Pecan, Hickory IgE: <0.1 kU/L
Penicillium Chrysogen IgE: <0.1 kU/L
Pigweed, Rough IgE: <0.1 kU/L
Ragweed, Short IgE: <0.1 kU/L
Sheep Sorrel IgE Qn: <0.1 kU/L
Timothy Grass IgE: <0.1 kU/L
White Mulberry IgE: <0.1 kU/L

## 2021-09-14 NOTE — Progress Notes (Signed)
Aeroallergen Immunotherapy  ? ?Ordering Provider: Dr. Malachi Bonds  ? ?Patient Details  ?Name: Misty Clark  ?MRN: [id number]  ?Date of Birth: 09-25-1950  ? ?Order 1 of 2  ? ?Vial Label: G/T  ? ?0.3 ml (Volume)  BAU Concentration -- 7 Grass Mix* 100,000 ([address] [place], [place], [place], [place], [place], [place], [place])  ?0.3 ml (Volume)  BAU Concentration -- [place] 10,000  ?0.2 ml (Volume)  1:20 Concentration -- [place]  ?0.5 ml (Volume)  1:20 Concentration -- Eastern 10 Tree Mix (also Sweet Gum)  ? ? ?1.3  ml Extract Subtotal  ?3.7  ml Diluent  ?5.0  ml Maintenance Total  ? ?Schedule:  A  ? ?Blue Vial (1:100,000): Schedule A (10 doses)  ?Yellow Vial (1:10,000): Schedule A (10 doses)  ?Green Vial (1:1,000): Schedule A (10 doses)  ?Red Vial (1:100): Schedule A (10 doses)  ? ?Special Instructions: none ?

## 2021-09-14 NOTE — Progress Notes (Signed)
Aeroallergen Immunotherapy  ? ?Ordering Provider: Dr. Malachi Bonds  ? ?Patient Details  ?Name: Misty Clark  ?MRN: [id number]  ?Date of Birth: 1951/01/12  ? ?Order 2 of 2  ? ?Vial Label: Molds  ? ?0.2 ml (Volume)  1:10 Concentration -- Aspergillus mix  ?0.2 ml (Volume)  1:10 Concentration -- Penicillium mix  ?0.2 ml (Volume)  1:10 Concentration -- Fusarium moniliforme  ?0.2 ml (Volume)  1:40 Concentration -- Aureobasidium pullulans  ?0.2 ml (Volume)  1:10 Concentration -- Rhizopus oryzae  ? ? ?1.0  ml Extract Subtotal  ?4.0  ml Diluent  ?5.0  ml Maintenance Total  ? ?Schedule:  A  ? ?Blue Vial (1:100,000): Schedule A (10 doses)  ?Yellow Vial (1:10,000): Schedule A (10 doses)  ?Green Vial (1:1,000): Schedule A (10 doses)  ?Red Vial (1:100): Schedule A (10 doses)  ? ?Special Instructions: none ?

## 2021-09-14 NOTE — Progress Notes (Signed)
VIALS NOT MADE UNTIL APPT MADE. 

## 2021-09-23 ENCOUNTER — Ambulatory Visit (INDEPENDENT_AMBULATORY_CARE_PROVIDER_SITE_OTHER): Payer: Medicare HMO

## 2021-09-23 VITALS — Ht 65.5 in | Wt 165.0 lb

## 2021-09-23 DIAGNOSIS — Z1212 Encounter for screening for malignant neoplasm of rectum: Secondary | ICD-10-CM

## 2021-09-23 DIAGNOSIS — Z1211 Encounter for screening for malignant neoplasm of colon: Secondary | ICD-10-CM

## 2021-09-23 DIAGNOSIS — Z0000 Encounter for general adult medical examination without abnormal findings: Secondary | ICD-10-CM

## 2021-09-23 DIAGNOSIS — J449 Chronic obstructive pulmonary disease, unspecified: Secondary | ICD-10-CM

## 2021-09-23 DIAGNOSIS — Z0001 Encounter for general adult medical examination with abnormal findings: Secondary | ICD-10-CM | POA: Diagnosis not present

## 2021-09-23 DIAGNOSIS — J301 Allergic rhinitis due to pollen: Secondary | ICD-10-CM

## 2021-09-23 NOTE — Progress Notes (Signed)
? ?Subjective:  ? Misty Clark is a 71 y.o. female who presents for Medicare Annual (Subsequent) preventive examination. ?Virtual Visit via Telephone Note ? ?I connected with  Simisola Sandles on 09/23/21 at 10:30 AM EDT by telephone and verified that I am speaking with the correct person using two identifiers. ? ?Location: ?Patient: HOME ?Provider: [facility] ?Persons participating in the virtual visit: patient/Nurse Health Advisor ?  ?I discussed the limitations, risks, security and privacy concerns of performing an evaluation and management service by telephone and the availability of in person appointments. The patient expressed understanding and agreed to proceed. ? ?Interactive audio and video telecommunications were attempted between this nurse and patient, however failed, due to patient having technical difficulties OR patient did not have access to video capability.  We continued and completed visit with audio only. ? ?Some vital signs may be absent or patient reported.  ? ?Darral Dash, LPN ? ?Review of Systems    ? ?Cardiac Risk Factors include: advanced age (>32men, >96 women);hypertension;sedentary lifestyle;dyslipidemia;Other (see comment), Risk factor comments: Asthma, COPD ? ?   ?Objective:  ?  ?Today's Vitals  ? 09/23/21 1028  ?Weight: 165 lb (74.8 kg)  ?Height: 5' 5.5" (1.664 m)  ? ?Body mass index is 27.04 kg/m?. ? ? ?  09/23/2021  ? 10:39 AM 07/28/2020  ? 10:43 AM  ?Advanced Directives  ?Does Patient Have a Medical Advance Directive? No No  ?Would patient like information on creating a medical advance directive? No - Patient declined No - Patient declined  ? ? ?Current Medications (verified) ?Outpatient Encounter Medications as of 09/23/2021  ?Medication Sig  ? albuterol (VENTOLIN HFA) 108 (90 Base) MCG/ACT inhaler INHALE 2 PUFFS BY MOUTH EVERY 6 HOURS AS NEEDED FOR WHEEZING AND FOR SHORTNESS OF BREATH  ? ALPRAZolam (XANAX) 0.5 MG tablet Take 1 tablet (0.5 mg total) by mouth daily as needed for  anxiety. Place on file. Doesn't need yet  ? atorvastatin (LIPITOR) 40 MG tablet Take 1 tablet (40 mg total) by mouth daily.  ? citalopram (CELEXA) 20 MG tablet Take 1 tablet (20 mg total) by mouth daily. (NEEDS TO BE SEEN BEFORE NEXT REFILL)  ? fluticasone (FLONASE) 50 MCG/ACT nasal spray Place 2 sprays into both nostrils daily.  ? Fluticasone-Umeclidin-Vilant (TRELEGY ELLIPTA) 200-62.5-25 MCG/ACT AEPB Inhale 1 puff into the lungs daily.  ? ipratropium (ATROVENT) 0.06 % nasal spray Place 1 spray into both nostrils 3 (three) times daily. every 8 hours as needed to help with drying out your nose  ? levocetirizine (XYZAL) 5 MG tablet Take 1 tablet (5 mg total) by mouth every evening.  ? montelukast (SINGULAIR) 10 MG tablet Take 1 tablet (10 mg total) by mouth at bedtime.  ? ?No facility-administered encounter medications on file as of 09/23/2021.  ? ? ?Allergies (verified) ?Codeine and Sulfonamide derivatives  ? ?History: ?Past Medical History:  ?Diagnosis Date  ? Allergy   ? Anxiety   ? Arthritis   ? 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 Hypertension   ? IBS (irritable bowel syndrome)   ? Mixed  ? Multiple allergies   ? Recurrent upper respiratory infection (URI)   ? ?Past Surgical History:  ?Procedure Laterality Date  ? ABDOMINAL HYSTERECTOMY    ? CHOLECYSTECTOMY    ? SHOULDER SURGERY Left 2012  ? SINOSCOPY    ? SINUS SURGERY WITH INSTATRAK    ? ?Family History  ?Problem Relation Age of Onset  ? Heart failure Maternal Grandmother   ?  CHF  ? Breast cancer Sister   ? Heart attack Paternal Grandfather   ?     MI  ? Coronary artery disease Paternal Grandfather   ?     At a premature age  ? Coronary artery disease Other   ? Colon cancer Other   ? Prostate cancer Other   ? Ovarian cancer Other   ? Asthma Mother   ? Thyroid disease Mother   ? Heart disease Father   ? Hypertension Daughter   ? Allergic rhinitis Neg Hx   ? Angioedema Neg Hx   ? Atopy Neg Hx   ? Eczema Neg Hx   ? Immunodeficiency Neg Hx   ?  Urticaria Neg Hx   ? ?Social History  ? ?Socioeconomic History  ? Marital status: Widowed  ?  Spouse name: Not on file  ? Number of children: 4  ? Years of education: Not on file  ? Highest education level: Some college, no degree  ?Occupational History  ? Occupation: Retired Runner, broadcasting/film/videoursing  ?  Comment: [employer]/ HH  ?Tobacco Use  ? Smoking status: Former  ?  Packs/day: 0.50  ?  Years: 3.00  ?  Pack years: 1.50  ?  Types: Cigarettes  ? Smokeless tobacco: Never  ? Tobacco comments:  ?  Quit many years ago.  ?Vaping Use  ? Vaping Use: Never used  ?Substance and Sexual Activity  ? Alcohol use: No  ? Drug use: Never  ? Sexual activity: Not on file  ?Other Topics Concern  ? Not on file  ?Social History Narrative  ? Retired Arts administratorlicensed practical nurse. 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 Lives alone. Enjoys crafting, puzzles, reading, and gardening.   ? ?Social Determinants of Health  ? ?Financial Resource Strain: Low Risk   ? Difficulty of Paying Living Expenses: Not hard at all  ?Food Insecurity: No Food Insecurity  ? Worried About Programme researcher, broadcasting/film/videounning Out of Food in the Last Year: Never true  ? Ran Out of Food in the Last Year: Never true  ?Transportation Needs: No Transportation Needs  ? Lack of Transportation (Medical): No  ? Lack of Transportation (Non-Medical): No  ?Physical Activity: Sufficiently Active  ? Days of Exercise per Week: 5 days  ? Minutes of Exercise per Session: 30 min  ?Stress: No Stress Concern Present  ? Feeling of Stress : Not at all  ?Social Connections: Moderately Integrated  ? Frequency of Communication with Friends and Family: More than three times a week  ? Frequency of Social Gatherings with Friends and Family: More than three times a week  ? Attends Religious Services: More than 4 times per year  ? Active Member of Clubs or Organizations: Yes  ? Attends BankerClub or Organization Meetings: More than 4 times per year  ? Marital Status: Widowed  ? ? ?Tobacco Counseling ?Counseling given: Not Answered ?Tobacco comments:  Quit many years ago. ? ? ?Clinical Intake: ? ?Pre-visit preparation completed: No ? ?Pain : No/denies pain ? ?  ? ?BMI - recorded: 27.04 ?Nutritional Status: BMI 25 -29 Overweight ?Nutritional Risks: None ?Diabetes: No ? ?How often do you need to have someone help you when you read instructions, pamphlets, or other written materials from your doctor or pharmacy?: 1 - Never ? ?Diabetic?no ? ?Interpreter Needed?: No ? ?Information entered by :: mj Hermes Wafer, lpn ? ? ?Activities of Daily Living ? ?  09/23/2021  ? 10:43 AM  ?In your present state of health, do you have any difficulty performing the  following activities:  ?Hearing? 0  ?Vision? 0  ?Difficulty concentrating or making decisions? 0  ?Walking or climbing stairs? 0  ?Dressing or bathing? 0  ?Doing errands, shopping? 0  ?Preparing Food and eating ? N  ?Using the Toilet? N  ?In the past six months, have you accidently leaked urine? Y  ?Comment stress incontinence at times.  ?Do you have problems with loss of bowel control? N  ?Managing your Medications? N  ?Managing your Finances? N  ?Housekeeping or managing your Housekeeping? N  ? ? ?Patient Care Team: ?Raliegh Ip, DO as PCP - General (Family Medicine) ? ?Indicate any recent Medical Services you may have received from other than [facility] providers in the past year (date may be approximate). ? ?   ?Assessment:  ? This is a routine wellness examination for Misty Clark. ? ?Hearing/Vision screen ?Hearing Screening - Comments:: No hearing issues.  ?Vision Screening - Comments:: Glasses. [employer] [place] 2022. ? ?Dietary issues and exercise activities discussed: ?Current Exercise Habits: Home exercise routine, Type of exercise: walking, Time (Minutes): 30, Frequency (Times/Week): 5, Weekly Exercise (Minutes/Week): 150, Intensity: Mild, Exercise limited by: cardiac condition(s) ? ? Goals Addressed   ? ?  ?  ?  ?  ? This Visit's Progress  ?  Exercise 3x per week (30 min per time)     ?  Increase exercise, work out in yard  and swim. ?  ? ?  ? ?Depression Screen ? ?  09/23/2021  ? 10:32 AM 03/03/2021  ?  3:35 PM 08/05/2020  ? 11:10 AM 07/28/2020  ? 10:43 AM 01/08/2020  ?  8:19 AM 01/08/2020  ?  8:11 AM 11/20/2019  ? 11:33 AM  ?PHQ 2/9 Sco

## 2021-09-23 NOTE — Patient Instructions (Signed)
Misty Clark , ?Thank you for taking time to come for your Medicare Wellness Visit. I appreciate your ongoing commitment to your health goals. Please review the following plan we discussed and let me know if I can assist you in the future.  ? ?Screening recommendations/referrals: ?Colonoscopy: Cologuard order placed today. ? ?Mammogram: Done 02/16/2021 Repeat annually ? ?Bone Density: Done 10/03/2019 Repeat every 2 years ? ?Recommended yearly ophthalmology/optometry visit for glaucoma screening and checkup ?Recommended yearly dental visit for hygiene and checkup ? ?Vaccinations: ?Influenza vaccine: Declined due to asthma issues. ?Pneumococcal vaccine: Done 06/02/2015 and 07/28/2016 ?Tdap vaccine: Due Repeat in 10 years ? ?Shingles vaccine: Discussed. Check with Allergist.   ?Covid-19:Done 03/25/2020 ? ?Advanced directives: Advance directive discussed with you today. Even though you declined this today, please call our office should you change your mind, and we can give you the proper paperwork for you to fill out. ? ? ?Conditions/risks identified: KEEP UP THE GOOD WORK!! ? ?Next appointment: Follow up in one year for your annual wellness visit 2024. ? ? ?Preventive Care 53 Years and Older, Female ?Preventive care refers to lifestyle choices and visits with your health care provider that can promote health and wellness. ?What does preventive care include? ?A yearly physical exam. This is also called an annual well check. ?Dental exams once or twice a year. ?Routine eye exams. Ask your health care provider how often you should have your eyes checked. ?Personal lifestyle choices, including: ?Daily care of your teeth and gums. ?Regular physical activity. ?Eating a healthy diet. ?Avoiding tobacco and drug use. ?Limiting alcohol use. ?Practicing safe sex. ?Taking low-dose aspirin every day. ?Taking vitamin and mineral supplements as recommended by your health care provider. ?What happens during an annual well check? ?The services  and screenings done by your health care provider during your annual well check will depend on your age, overall health, lifestyle risk factors, and family history of disease. ?Counseling  ?Your health care provider may ask you questions about your: ?Alcohol use. ?Tobacco use. ?Drug use. ?Emotional well-being. ?Home and relationship well-being. ?Sexual activity. ?Eating habits. ?History of falls. ?Memory and ability to understand (cognition). ?Work and work Astronomer. ?Reproductive health. ?Screening  ?You may have the following tests or measurements: ?Height, weight, and BMI. ?Blood pressure. ?Lipid and cholesterol levels. These may be checked every 5 years, or more frequently if you are over 58 years old. ?Skin check. ?Lung cancer screening. You may have this screening every year starting at age 39 if you have a 30-pack-year history of smoking and currently smoke or have quit within the past 15 years. ?Fecal occult blood test (FOBT) of the stool. You may have this test every year starting at age 19. ?Flexible sigmoidoscopy or colonoscopy. You may have a sigmoidoscopy every 5 years or a colonoscopy every 10 years starting at age 87. ?Hepatitis C blood test. ?Hepatitis B blood test. ?Sexually transmitted disease (STD) testing. ?Diabetes screening. This is done by checking your blood sugar (glucose) after you have not eaten for a while (fasting). You may have this done every 1-3 years. ?Bone density scan. This is done to screen for osteoporosis. You may have this done starting at age 42. ?Mammogram. This may be done every 1-2 years. Talk to your health care provider about how often you should have regular mammograms. ?Talk with your health care provider about your test results, treatment options, and if necessary, the need for more tests. ?Vaccines  ?Your health care provider may recommend certain vaccines, such as: ?  Influenza vaccine. This is recommended every year. ?Tetanus, diphtheria, and acellular pertussis  (Tdap, Td) vaccine. You may need a Td booster every 10 years. ?Zoster vaccine. You may need this after age 58. ?Pneumococcal 13-valent conjugate (PCV13) vaccine. One dose is recommended after age 29. ?Pneumococcal polysaccharide (PPSV23) vaccine. One dose is recommended after age 36. ?Talk to your health care provider about which screenings and vaccines you need and how often you need them. ?This information is not intended to replace advice given to you by your health care provider. Make sure you discuss any questions you have with your health care provider. ?Document Released: 07/18/2015 Document Revised: 03/10/2016 Document Reviewed: 04/22/2015 ?Elsevier Interactive Patient Education ? 2017 Elsevier Inc. ? ?Fall Prevention in the Home ?Falls can cause injuries. They can happen to people of all ages. There are many things you can do to make your home safe and to help prevent falls. ?What can I do on the outside of my home? ?Regularly fix the edges of walkways and driveways and fix any cracks. ?Remove anything that might make you trip as you walk through a door, such as a raised step or threshold. ?Trim any bushes or trees on the path to your home. ?Use bright outdoor lighting. ?Clear any walking paths of anything that might make someone trip, such as rocks or tools. ?Regularly check to see if handrails are loose or broken. Make sure that both sides of any steps have handrails. ?Any raised decks and porches should have guardrails on the edges. ?Have any leaves, snow, or ice cleared regularly. ?Use sand or salt on walking paths during winter. ?Clean up any spills in your garage right away. This includes oil or grease spills. ?What can I do in the bathroom? ?Use night lights. ?Install grab bars by the toilet and in the tub and shower. Do not use towel bars as grab bars. ?Use non-skid mats or decals in the tub or shower. ?If you need to sit down in the shower, use a plastic, non-slip stool. ?Keep the floor dry. Clean  up any water that spills on the floor as soon as it happens. ?Remove soap buildup in the tub or shower regularly. ?Attach bath mats securely with double-sided non-slip rug tape. ?Do not have throw rugs and other things on the floor that can make you trip. ?What can I do in the bedroom? ?Use night lights. ?Make sure that you have a light by your bed that is easy to reach. ?Do not use any sheets or blankets that are too big for your bed. They should not hang down onto the floor. ?Have a firm chair that has side arms. You can use this for support while you get dressed. ?Do not have throw rugs and other things on the floor that can make you trip. ?What can I do in the kitchen? ?Clean up any spills right away. ?Avoid walking on wet floors. ?Keep items that you use a lot in easy-to-reach places. ?If you need to reach something above you, use a strong step stool that has a grab bar. ?Keep electrical cords out of the way. ?Do not use floor polish or wax that makes floors slippery. If you must use wax, use non-skid floor wax. ?Do not have throw rugs and other things on the floor that can make you trip. ?What can I do with my stairs? ?Do not leave any items on the stairs. ?Make sure that there are handrails on both sides of the stairs and use them.  Fix handrails that are broken or loose. Make sure that handrails are as long as the stairways. ?Check any carpeting to make sure that it is firmly attached to the stairs. Fix any carpet that is loose or worn. ?Avoid having throw rugs at the top or bottom of the stairs. If you do have throw rugs, attach them to the floor with carpet tape. ?Make sure that you have a light switch at the top of the stairs and the bottom of the stairs. If you do not have them, ask someone to add them for you. ?What else can I do to help prevent falls? ?Wear shoes that: ?Do not have high heels. ?Have rubber bottoms. ?Are comfortable and fit you well. ?Are closed at the toe. Do not wear sandals. ?If you  use a stepladder: ?Make sure that it is fully opened. Do not climb a closed stepladder. ?Make sure that both sides of the stepladder are locked into place. ?Ask someone to hold it for you, if possible. ?

## 2021-09-25 ENCOUNTER — Telehealth: Payer: Self-pay | Admitting: *Deleted

## 2021-09-25 NOTE — Telephone Encounter (Signed)
Error

## 2021-10-07 ENCOUNTER — Telehealth: Payer: Self-pay | Admitting: *Deleted

## 2021-10-07 NOTE — Telephone Encounter (Signed)
? ?  Telephone encounter was:  Unsuccessful.  10/07/2021 ?Name: Misty Clark MRN: [id number] DOB: 1951-03-17 ? ?Unsuccessful outbound call made today to assist with:   Allergy shot co pays  ? ?Outreach Attempt:  1st Attempt ? ?A HIPAA compliant voice message was left requesting a return call.  Instructed patient to call back at   Instructed patient to call back at [phone]  at their earliest convenience. . ? ?[name] ?Care Guide , Embedded Care Coordination ?[place], Care Management  ?[phone] ?[address] , [place] [address] ?Email : [email or web address] ?  ? ?

## 2021-10-12 ENCOUNTER — Telehealth: Payer: Self-pay | Admitting: *Deleted

## 2021-10-12 NOTE — Telephone Encounter (Signed)
? ?  Telephone encounter was:  Unsuccessful.  10/12/2021 ?Name: Misty Clark MRN: [id number] DOB: 1951/06/24 ? ?Unsuccessful outbound call made today to assist with:   Allergy Shot ? ?Outreach Attempt:  2nd Attempt ? ?A HIPAA compliant voice message was left requesting a return call.  Instructed patient to call back at   Instructed patient to call back at [phone]  at their earliest convenience. . ? ?[name] ?Care Guide , Embedded Care Coordination ?[place], Care Management  ?[phone] ?[address] [place] , [place] [address] ?Email : [email or web address] ?  ? ?

## 2021-10-13 ENCOUNTER — Telehealth: Payer: Self-pay | Admitting: *Deleted

## 2021-10-13 NOTE — Telephone Encounter (Signed)
? ?  Telephone encounter was:  Unsuccessful.  10/13/2021 ?Name: Misty Clark MRN: [id number] DOB: 1951-05-31 ? ?Unsuccessful outbound call made today to assist with:   allergy shots  ? ?Outreach Attempt:  3rd Attempt.  Referral closed unable to contact patient. ? ?A HIPAA compliant voice message was left requesting a return call.  Instructed patient to call back at   Instructed patient to call back at [phone]  at their earliest convenience. . ? ?Delyla Sandeen Greenauer -Berneda Rose ?Care Guide , Embedded Care Coordination ?[place], Care Management  ?[phone] ?[address] [place] , [place] [address] ?Email : [email or web address] ?  ?

## 2021-11-09 ENCOUNTER — Other Ambulatory Visit: Payer: Self-pay | Admitting: Family Medicine

## 2021-11-09 DIAGNOSIS — F411 Generalized anxiety disorder: Secondary | ICD-10-CM

## 2021-11-10 ENCOUNTER — Other Ambulatory Visit: Payer: Self-pay | Admitting: Family Medicine

## 2021-11-10 DIAGNOSIS — F411 Generalized anxiety disorder: Secondary | ICD-10-CM

## 2021-11-26 ENCOUNTER — Ambulatory Visit: Payer: Medicare HMO | Admitting: Family Medicine

## 2021-12-11 ENCOUNTER — Encounter: Payer: Self-pay | Admitting: Family Medicine

## 2021-12-11 ENCOUNTER — Ambulatory Visit (INDEPENDENT_AMBULATORY_CARE_PROVIDER_SITE_OTHER): Payer: Medicare HMO | Admitting: Family Medicine

## 2021-12-11 VITALS — BP 148/84 | HR 84 | Temp 98.0°F | Ht 65.5 in | Wt 162.6 lb

## 2021-12-11 DIAGNOSIS — F411 Generalized anxiety disorder: Secondary | ICD-10-CM | POA: Diagnosis not present

## 2021-12-11 DIAGNOSIS — J302 Other seasonal allergic rhinitis: Secondary | ICD-10-CM | POA: Diagnosis not present

## 2021-12-11 DIAGNOSIS — I10 Essential (primary) hypertension: Secondary | ICD-10-CM | POA: Diagnosis not present

## 2021-12-11 DIAGNOSIS — R252 Cramp and spasm: Secondary | ICD-10-CM | POA: Diagnosis not present

## 2021-12-11 DIAGNOSIS — E782 Mixed hyperlipidemia: Secondary | ICD-10-CM | POA: Diagnosis not present

## 2021-12-11 DIAGNOSIS — Z23 Encounter for immunization: Secondary | ICD-10-CM

## 2021-12-11 DIAGNOSIS — R69 Illness, unspecified: Secondary | ICD-10-CM | POA: Diagnosis not present

## 2021-12-11 MED ORDER — CITALOPRAM HYDROBROMIDE 20 MG PO TABS: ORAL_TABLET | ORAL | 3 refills | Status: DC

## 2021-12-11 MED ORDER — HYDROCHLOROTHIAZIDE 25 MG PO TABS: 12.5000 mg | ORAL_TABLET | Freq: Every day | ORAL | 3 refills | Status: DC | PRN

## 2021-12-11 MED ORDER — ALPRAZOLAM 0.5 MG PO TABS: 0.5000 mg | ORAL_TABLET | Freq: Every day | ORAL | 1 refills | Status: DC | PRN

## 2021-12-11 MED ORDER — IPRATROPIUM BROMIDE 0.06 % NA SOLN: 1.0000 | Freq: Three times a day (TID) | NASAL | 3 refills | Status: DC

## 2021-12-11 MED ORDER — MONTELUKAST SODIUM 10 MG PO TABS: 10.0000 mg | ORAL_TABLET | Freq: Every day | ORAL | 3 refills | Status: DC

## 2021-12-11 MED ORDER — AMLODIPINE BESYLATE 2.5 MG PO TABS: 2.5000 mg | ORAL_TABLET | Freq: Every day | ORAL | 3 refills | Status: DC

## 2021-12-11 MED ORDER — ATORVASTATIN CALCIUM 40 MG PO TABS: 40.0000 mg | ORAL_TABLET | Freq: Every day | ORAL | 3 refills | Status: DC

## 2021-12-11 MED ORDER — FLUTICASONE PROPIONATE 50 MCG/ACT NA SUSP: 2.0000 | Freq: Every day | NASAL | 3 refills | Status: DC

## 2021-12-11 MED ORDER — LEVOCETIRIZINE DIHYDROCHLORIDE 5 MG PO TABS: 5.0000 mg | ORAL_TABLET | Freq: Every evening | ORAL | 3 refills | Status: DC

## 2021-12-11 MED ORDER — ALBUTEROL SULFATE HFA 108 (90 BASE) MCG/ACT IN AERS: INHALATION_SPRAY | RESPIRATORY_TRACT | 1 refills | Status: DC

## 2021-12-11 NOTE — Progress Notes (Signed)
Subjective: CC: Chronic follow-up for anxiety, depression, hypertension hyperlipidemia PCP: Raliegh Ip, DO BZJ:IRCVE Misty Clark is a 71 y.o. female presenting to clinic today for:  1.  Anxiety Patient reports stability of anxiety with very sparing use of alprazolam.  Her last Rx expired before she could actually refill it.  She takes Celexa 20 mg daily.  She is had some exacerbation in anxiety and stress mostly related to multiple deaths and cancer diagnosis is within the family.  Her mother continues to be somewhat of a struggle as well.  Overall though she feels okay.  She is been enjoying some time with her great granddaughter in [place]  2.  Hypertension with hyperlipidemia Patient is compliant with Lipitor.  She takes triamterene-hydrochlorothiazide daily for blood pressure but notes that she is been having increasing cramps in the hands and feet as of late.  She hydrates very well.  No chest pain or shortness of breath reported.  3.  COPD/asthma Patient was recently restarted on Trelegy.  She has a follow-up with her specialist soon.   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 Place on file. Doesn't need yet, Disp: 20 tablet, Rfl: 1   atorvastatin (LIPITOR) 40 MG tablet, Take 1 tablet (40 mg total) by mouth daily., Disp: 90 tablet,  Rfl: 3   citalopram (CELEXA) 20 MG tablet, TAKE 1 TABLET BY MOUTH ONCE DAILY . APPOINTMENT REQUIRED FOR FUTURE REFILLS, Disp: 30 tablet, Rfl: 0   fluticasone (FLONASE) 50 MCG/ACT nasal spray, Place 2 sprays into both nostrils daily., Disp: 48 g, Rfl: 3   ipratropium (ATROVENT) 0.06 % nasal spray, Place 1 spray into both nostrils 3 (three) times daily. every 8 hours as needed to help with drying out your nose, Disp: 45 mL, Rfl: 3   levocetirizine (XYZAL) 5 MG tablet, Take 1 tablet (5 mg total) by mouth every evening., Disp: 90 tablet, Rfl: 3   montelukast (SINGULAIR) 10 MG tablet, Take 1 tablet (10 mg total) by mouth at bedtime., Disp: 90 tablet, Rfl: 3 Social History   Socioeconomic History   Marital status: Widowed    Spouse name: Not on file   Number of children: 4   Years of education: Not on file   Highest education level: Some college, no degree  Occupational History   Occupation: Retired Nursing    Comment: Engineer, petroleum [employer]  Tobacco Use   Smoking status: Former    Packs/day: 0.50    Years: 3.00    Total pack years: 1.50    Types: Cigarettes   Smokeless tobacco: Never   Tobacco comments:    Quit many years ago.  Vaping Use   Vaping Use: Never used  Substance and Sexual Activity  Alcohol use: No   Drug use: Never   Sexual activity: Not on file  Other Topics Concern   Not on file  Social History Narrative   Retired Arts administrator. 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 Lives alone. Enjoys crafting, puzzles, reading, and gardening.    Social Determinants of Health   Financial Resource Strain: Low Risk  (09/23/2021)   Overall Financial Resource Strain (CARDIA)    Difficulty of Paying Living Expenses: Not hard at all  Food Insecurity: No Food Insecurity (09/23/2021)   Hunger Vital Sign    Worried About Running Out of Food in the Last Year: Never true    Ran Out of Food in the Last Year: Never true  Transportation Needs: No Transportation Needs (09/23/2021)   PRAPARE -  Administrator, Civil Service (Medical): No    Lack of Transportation (Non-Medical): No  Physical Activity: Sufficiently Active (09/23/2021)   Exercise Vital Sign    Days of Exercise per Week: 5 days    Minutes of Exercise per Session: 30 min  Stress: No Stress Concern Present (09/23/2021)   [employer] of Occupational Health - Occupational Stress Questionnaire    Feeling of Stress : Not at all  Social Connections: Moderately Integrated (09/23/2021)   Social Connection and Isolation Panel [NHANES]    Frequency of Communication with Friends and Family: More than three times a week    Frequency of Social Gatherings with Friends and Family: More than three times a week    Attends Religious Services: More than 4 times per year    Active Member of [employer] or Organizations: Yes    Attends Banker Meetings: More than 4 times per year    Marital Status: Widowed  Intimate Partner Violence: Not At Risk (09/23/2021)   Humiliation, Afraid, Rape, and Kick questionnaire    Fear of Current or Ex-Partner: No    Emotionally Abused: No    Physically Abused: No    Sexually Abused: No   Family History  Problem Relation Age of Onset   Heart failure Maternal Grandmother        CHF   Breast cancer Sister    Heart attack Paternal Grandfather        MI   Coronary artery disease Paternal Grandfather        At a premature age   Coronary artery disease Other    Colon cancer Other    Prostate cancer Other    Ovarian cancer Other    Asthma Mother    Thyroid disease Mother    Heart disease Father    Hypertension Daughter    Allergic rhinitis Neg Hx    Angioedema Neg Hx    Atopy Neg Hx    Eczema Neg Hx    Immunodeficiency Neg Hx    Urticaria Neg Hx     Objective: Office vital signs reviewed. BP (!) 148/84   Pulse 84   Temp 98 F (36.7 C)   Ht 5' 5.5" (1.664 m)   Wt 162 lb 9.6 oz (73.8 kg)   SpO2 97%   BMI 26.65 kg/m   Physical Examination:  General: Awake,  alert, well nourished, No acute distress HEENT: Sclera white.  Moist mucous membranes Cardio: regular rate and rhythm, S1S2 heard, no murmurs appreciated Pulm: clear to auscultation bilaterally, no wheezes, rhonchi or rales; normal work of breathing on room air Extremities: warm, well perfused, No edema, cyanosis or clubbing; +2 pulses bilaterally MSK: normal gait and station  12/11/2021   11:52 AM 09/23/2021   10:32 AM 03/03/2021    3:35 PM  Depression screen PHQ 2/9  Decreased Interest 0 0 0  Down, Depressed, Hopeless 0 1 0  PHQ - 2 Score 0 1 0    Assessment/ Plan: 71 y.o. female   Generalized anxiety disorder - Plan: ALPRAZolam (XANAX) 0.5 MG tablet, citalopram (CELEXA) 20 MG tablet  Mixed hyperlipidemia - Plan: atorvastatin (LIPITOR) 40 MG tablet, Lipid Panel  Seasonal allergic rhinitis, unspecified trigger - Plan: fluticasone (FLONASE) 50 MCG/ACT nasal spray, ipratropium (ATROVENT) 0.06 % nasal spray, levocetirizine (XYZAL) 5 MG tablet, montelukast (SINGULAIR) 10 MG tablet  Essential hypertension, benign - Plan: amLODipine (NORVASC) 2.5 MG tablet, hydrochlorothiazide (HYDRODIURIL) 25 MG tablet  Cramping of hands - Plan: Basic Metabolic Panel, Magnesium  Anxiety disorder stable.  Up-to-date on UDS and CSC.  National narcotic database reviewed and there were no red flags.  Celexa and alprazolam renewed.  Check lipid panel.  Continue Lipitor.  Nasal spray and allergy medications have been renewed.  Continue to follow-up with specialist for asthma and allergy  We will start Norvasc 2.5 mg daily for blood pressure control.  May utilize the hydrochlorothiazide as needed edema.  Suspect cramping in hands may be manifestations of hypokalemia in the setting of chronic diuretic use.  Hopefully by reducing frequency of HCTZ, this will resolve the issue  Follow-up in 6 months for second shingles vaccination, annual physical with fasting labs and removal of benzodiazepine if needed at  that time  No orders of the defined types were placed in this encounter.  No orders of the defined types were placed in this encounter.    Raliegh IpAshly M Korvin Valentine, DO [facility] [phone]

## 2021-12-12 LAB — BASIC METABOLIC PANEL
BUN/Creatinine Ratio: 15 (ref 12–28)
BUN: 8 mg/dL (ref 8–27)
CO2: 24 mmol/L (ref 20–29)
Calcium: 9.8 mg/dL (ref 8.7–10.3)
Chloride: 92 mmol/L — ABNORMAL LOW (ref 96–106)
Creatinine, Ser: 0.52 mg/dL — ABNORMAL LOW (ref 0.57–1.00)
Glucose: 101 mg/dL — ABNORMAL HIGH (ref 70–99)
Potassium: 4.2 mmol/L (ref 3.5–5.2)
Sodium: 133 mmol/L — ABNORMAL LOW (ref 134–144)
eGFR: 99 mL/min/{1.73_m2} (ref >59)

## 2021-12-12 LAB — LIPID PANEL
Chol/HDL Ratio: 2.6 ratio (ref 0.0–4.4)
Cholesterol, Total: 181 mg/dL (ref 100–199)
HDL: 69 mg/dL (ref >39)
LDL Chol Calc (NIH): 93 mg/dL (ref 0–99)
Triglycerides: 109 mg/dL (ref 0–149)
VLDL Cholesterol Cal: 19 mg/dL (ref 5–40)

## 2021-12-12 LAB — MAGNESIUM: Magnesium: 2.1 mg/dL (ref 1.6–2.3)

## 2021-12-17 ENCOUNTER — Encounter: Payer: Self-pay | Admitting: Allergy & Immunology

## 2021-12-17 ENCOUNTER — Ambulatory Visit (INDEPENDENT_AMBULATORY_CARE_PROVIDER_SITE_OTHER): Payer: Medicare HMO | Admitting: Allergy & Immunology

## 2021-12-17 VITALS — BP 140/88 | HR 78 | Temp 98.2°F | Ht 65.5 in | Wt 164.4 lb

## 2021-12-17 DIAGNOSIS — J302 Other seasonal allergic rhinitis: Secondary | ICD-10-CM

## 2021-12-17 DIAGNOSIS — J449 Chronic obstructive pulmonary disease, unspecified: Secondary | ICD-10-CM | POA: Diagnosis not present

## 2021-12-17 DIAGNOSIS — J3089 Other allergic rhinitis: Secondary | ICD-10-CM | POA: Diagnosis not present

## 2021-12-17 DIAGNOSIS — J984 Other disorders of lung: Secondary | ICD-10-CM | POA: Diagnosis not present

## 2021-12-17 MED ORDER — TRELEGY ELLIPTA 200-62.5-25 MCG/ACT IN AEPB: 1.0000 | INHALATION_SPRAY | Freq: Every day | RESPIRATORY_TRACT | 5 refills | Status: DC

## 2021-12-17 NOTE — Patient Instructions (Addendum)
1. Chronic rhinitis (grasses, trees and indoor molds) - Make an appointment to start allergy shots. - The script is written, but we do not mix it until you make an appointment to start.  - Continue with Zyrtec (cetirizine) 10mg  tablet once daily and Singulair (montelukast) 10mg  daily and Flonase (fluticasone) one spray per nostril daily  - Continue with nasal ipratropium every 8 hours as needed to help with drying out your nose.  - Continue with nasal saline rinses 1-2 times daily to remove allergens from the nasal cavities as well as help with mucous clearance (this is especially helpful to do before the nasal sprays are given)   2. Mild persistent asthma, uncomplicated - Lung testing looked a bit worse today, but it is always pretty terrible.  - Sample of Trelegy provided today since you lost your current one.  - Daily controller medication(s): Trelegy 200/62.5/25 one puff once daily + Singulair (montelukast) 10mg  - Prior to physical activity: albuterol 2 puffs 10-15 minutes before physical activity. - Rescue medications: albuterol 4 puffs every 4-6 hours as needed - Asthma control goals:  * Full participation in all desired activities (may need albuterol before activity) * Albuterol use two time or less a week on average (not counting use with activity) * Cough interfering with sleep two time or less a month * Oral steroids no more than once a year * No hospitalizations  3. Return in about 3 months (around 03/19/2022).    Please inform 01-09-1999 of any Emergency Department visits, hospitalizations, or changes in symptoms. Call before going to the ED for breathing or allergy symptoms since we might be able to fit you in for a sick visit. Feel free to contact 03/21/2022 anytime with any questions, problems, or concerns.  It was a pleasure to see you again today!  Websites that have reliable patient information: 1. American Academy of Asthma, Allergy, and Immunology: www.aaaai.org 2. Food  Allergy Research and Education (FARE): foodallergy.org 3. Mothers of Asthmatics: http://www.asthmacommunitynetwork.org 4. American College of Allergy, Asthma, and Immunology: www.acaai.org   COVID-19 Vaccine Information can be found at: [place] For questions related to vaccine distribution or appointments, please email [email or web address] or call [phone].   We realize that you might be concerned about having an allergic reaction to the COVID19 vaccines. To help with that concern, WE ARE OFFERING THE COVID19 VACCINES IN OUR OFFICE! Ask the front desk for dates!     "Like" [place] on Facebook and Instagram for our latest updates!      A healthy democracy works best when [email or web address] participate! Make sure you are registered to vote! If you have moved or changed any of your contact information, you will need to get this updated before voting!  In some cases, you MAY be able to register to vote online: [phone]

## 2021-12-17 NOTE — Progress Notes (Signed)
FOLLOW UP  Date of Service/Encounter:  12/17/21   Assessment:   Asthma COPD overlap syndrome - with remote history of smoking for 3 to 4 years back in high school (consider starting a biologic)   Persistent restrictive lung disease - not responsive to prednisone   Seasonal and perennial allergic rhinitis (grasses, trees and indoor molds) - with new concern for pet exposure   Plan/Recommendations:   1. Chronic rhinitis (grasses, trees and indoor molds) - Make an appointment to start allergy shots. - The script is written, but we do not mix it until you make an appointment to start.  - Continue with Zyrtec (cetirizine) 10mg  tablet once daily and Singulair (montelukast) 10mg  daily and Flonase (fluticasone) one spray per nostril daily  - Continue with nasal ipratropium every 8 hours as needed to help with drying out your nose.  - Continue with nasal saline rinses 1-2 times daily to remove allergens from the nasal cavities as well as help with mucous clearance (this is especially helpful to do before the nasal sprays are given)   2. Mild persistent asthma, uncomplicated - Lung testing looked a bit worse today, but it is always pretty terrible.  - Sample of Trelegy provided today since you lost your current one.  - Daily controller medication(s): Trelegy 200/62.5/25 one puff once daily + Singulair (montelukast) 10mg  - Prior to physical activity: albuterol 2 puffs 10-15 minutes before physical activity. - Rescue medications: albuterol 4 puffs every 4-6 hours as needed - Asthma control goals:  * Full participation in all desired activities (may need albuterol before activity) * Albuterol use two time or less a week on average (not counting use with activity) * Cough interfering with sleep two time or less a month * Oral steroids no more than once a year * No hospitalizations  3. Return in about 3 months (around 03/19/2022).   Subjective:   Misty Clark is a 71 y.o. female  presenting today for follow up of No chief complaint on file.   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  Misty Clark is a 71 y.o. female presenting for a follow up visit. She was last seen in March 2023. At that time, lung testing did look better. We put her back on Trelegy since she needed something that was easier to remember to use. We also continued with montelukast as well as albuterol as needed. We did discuss starting allergy shots as well and she decided to go ahead and do that.   Since the last visit, she has done well. She does report that she is having a lot of problems with coughing with the pollen and the wind. She does spend a lot of time outdoors. Overall things seem to be improving since they peaked last week.  Asthma/Respiratory Symptom History: She remains on her Trelegy. She tells me that "something happened to my Trelegy". She lost it and the last time she used it was two days ago.  She has never seen Pulmonology. She feels that  her symptoms are just as a good as the last time when I saw her.   Allergic Rhinitis Symptom History: She remains on her fluticasone as well as montelukast.  She remains interested in starting allergy shots.  She wanted to find out whether she was allergic to cats before starting the shots.  We reviewed her testing and she was negative on prick testing, intradermal testing, and labs. Therefore, I do not see an indication to add cats to her  regimen.  She had a flu like illness from the shingles vaccine.  Around 48 hours after the injection, she felt terrible and she had an itchy rash.  She is still planning to get the second injection because she definitely does not want shingles.  Otherwise, there have been no changes to her past medical history, surgical history, family history, or social history.    Review of Systems  Constitutional: Negative.  Negative for chills, fever, malaise/fatigue and weight loss.  HENT:  Negative for congestion, ear discharge, ear pain and sinus pain.   Eyes:  Negative for pain, discharge and redness.  Respiratory:  Negative for cough, sputum production, shortness of breath and wheezing.   Cardiovascular: Negative.  Negative for chest pain and palpitations.  Gastrointestinal:  Negative for abdominal pain, constipation, diarrhea, heartburn, nausea and vomiting.  Skin: Negative.  Negative for itching and rash.  Neurological:  Negative for dizziness and headaches.  Endo/Heme/Allergies:  Negative for environmental allergies. Does not bruise/bleed easily.       Objective:   Blood pressure 140/88, pulse 78, temperature 98.2 F (36.8 C), height 5' 5.5" (1.664 m), weight 164 lb 6.4 oz (74.6 kg), SpO2 97 %. Body mass index is 26.94 kg/m.    Physical Exam Vitals reviewed.  Constitutional:      Appearance: She is well-developed.  HENT:     Head: Normocephalic and atraumatic.     Right Ear: Tympanic membrane, ear canal and external ear normal. No drainage, swelling or tenderness. Tympanic membrane is not injected, scarred, erythematous, retracted or bulging.     Left Ear: Tympanic membrane, ear canal and external ear normal. No drainage, swelling or tenderness. Tympanic membrane is not injected, scarred, erythematous, retracted or bulging.     Nose: No nasal deformity, septal deviation, mucosal edema or rhinorrhea.     Right Turbinates: Enlarged, swollen and pale.     Left Turbinates: Enlarged,  swollen and pale.     Right Sinus: No maxillary sinus tenderness or frontal sinus tenderness.     Left Sinus: No maxillary sinus tenderness or frontal sinus tenderness.     Comments: No nasal polyps noted.     Mouth/Throat:     Mouth: Mucous membranes are not pale and not dry.     Pharynx: Uvula midline.  Eyes:     General: Lids are normal.        Right eye: No discharge.        Left eye: No discharge.     Conjunctiva/sclera: Conjunctivae normal.     Right eye: Right conjunctiva is not injected. No chemosis.    Left eye: Left conjunctiva is not injected. No chemosis.    Pupils: Pupils are equal, round, and reactive to light.  Cardiovascular:     Rate and Rhythm: Normal rate and regular rhythm.     Heart sounds: Normal heart sounds.  Pulmonary:     Effort: Pulmonary effort is normal. No tachypnea, accessory muscle usage or respiratory distress.     Breath sounds:  Normal breath sounds. No wheezing, rhonchi or rales.     Comments: Moving air well in all lung fields. No increased work of breathing noted.  Chest:     Chest wall: No tenderness.  Abdominal:     Tenderness: There is no abdominal tenderness. There is no guarding or rebound.  Lymphadenopathy:     Head:     Right side of head: No submandibular, tonsillar or occipital adenopathy.     Left side of head: No submandibular, tonsillar or occipital adenopathy.     Cervical: No cervical adenopathy.  Skin:    Coloration: Skin is not pale.     Findings: No abrasion, erythema, petechiae or rash. Rash is not papular, urticarial or vesicular.  Neurological:     Mental Status: She is alert.  Psychiatric:        Behavior: Behavior is cooperative.      Diagnostic studies:    Spirometry: results abnormal (FEV1: 0.72/31%, FVC: 1.04/34%, FEV1/FVC: 69%).    Spirometry consistent with restrictive disease. This is lower compared to previous spirometry findings.    Allergy Studies: none        Salvatore Marvel, MD  Allergy and  [place] [facility] [place]

## 2022-01-08 ENCOUNTER — Telehealth (INDEPENDENT_AMBULATORY_CARE_PROVIDER_SITE_OTHER): Payer: Medicare HMO | Admitting: Nurse Practitioner

## 2022-01-08 ENCOUNTER — Encounter: Payer: Self-pay | Admitting: Nurse Practitioner

## 2022-01-08 DIAGNOSIS — J019 Acute sinusitis, unspecified: Secondary | ICD-10-CM | POA: Diagnosis not present

## 2022-01-08 DIAGNOSIS — B9689 Other specified bacterial agents as the cause of diseases classified elsewhere: Secondary | ICD-10-CM | POA: Diagnosis not present

## 2022-01-08 MED ORDER — PREDNISONE 10 MG PO TABS: 10.0000 mg | ORAL_TABLET | Freq: Every day | ORAL | 0 refills | Status: DC

## 2022-01-08 MED ORDER — AZITHROMYCIN 250 MG PO TABS: ORAL_TABLET | ORAL | 0 refills | Status: AC

## 2022-01-08 MED ORDER — GUAIFENESIN ER 600 MG PO TB12: 600.0000 mg | ORAL_TABLET | Freq: Two times a day (BID) | ORAL | 0 refills | Status: DC

## 2022-01-08 NOTE — Progress Notes (Signed)
   Virtual Visit  Note Due to COVID-19 pandemic this visit was conducted virtually. This visit type was conducted due to national recommendations for restrictions regarding the COVID-19 Pandemic (e.g. social distancing, sheltering in place) in an effort to limit this patient's exposure and mitigate transmission in our community. All issues noted in this document were discussed and addressed.  A physical exam was not performed with this format.  I connected with Misty Clark on 01/08/22 at 2 PM by telephone and verified that I am speaking with the correct person using two identifiers. Misty Clark is currently located at home during visit. The provider, Daryll Drown, NP is located in their office at time of visit.  I discussed the limitations, risks, security and privacy concerns of performing an evaluation and management service by telephone and the availability of in person appointments. I also discussed with the patient that there may be a patient responsible charge related to this service. The patient expressed understanding and agreed to proceed.   History and Present Illness:  Sinusitis This is a new problem. Episode onset: The past 2 days. The problem has been gradually worsening since onset. There has been no fever. The pain is moderate. Associated symptoms include chills, congestion, ear pain, headaches and sinus pressure. Pertinent negatives include no shortness of breath, sore throat or swollen glands. Past treatments include nothing.      Review of Systems  Constitutional:  Positive for chills.  HENT:  Positive for congestion, ear pain and sinus pressure. Negative for sore throat.   Eyes: Negative.   Respiratory:  Negative for shortness of breath.   Cardiovascular: Negative.   Gastrointestinal: Negative.   Skin: Negative.  Negative for rash.  Neurological:  Positive for headaches.  All other systems reviewed and are negative.    Observations/Objective: Televisit  patient not in distress.  Assessment and Plan: Patient presents with acute frontal sinusitis with cough and congestion Advised patient to take meds as prescribed - Use a cool mist humidifier  -Use saline nose sprays frequently -Force fluids -For fever or aches or pains- take Tylenol or ibuprofen. -If symptoms do not improve, she may need to be COVID tested to rule this out Follow up with worsening unresolved symptoms  Follow Up Instructions: Follow-up with worsening unresolved symptoms    I discussed the assessment and treatment plan with the patient. The patient was provided an opportunity to ask questions and all were answered. The patient agreed with the plan and demonstrated an understanding of the instructions.   The patient was advised to call back or seek an in-person evaluation if the symptoms worsen or if the condition fails to improve as anticipated.  The above assessment and management plan was discussed with the patient. The patient verbalized understanding of and has agreed to the management plan. Patient is aware to call the clinic if symptoms persist or worsen. Patient is aware when to return to the clinic for a follow-up visit. Patient educated on when it is appropriate to go to the emergency department.   Time call ended: 2:13 PM   I provided 13 minutes of  non face-to-face time during this encounter.    Daryll Drown, NP

## 2022-03-11 DIAGNOSIS — H5203 Hypermetropia, bilateral: Secondary | ICD-10-CM | POA: Diagnosis not present

## 2022-03-25 ENCOUNTER — Ambulatory Visit: Payer: Medicare HMO | Admitting: Allergy & Immunology

## 2022-04-03 DIAGNOSIS — H5203 Hypermetropia, bilateral: Secondary | ICD-10-CM | POA: Diagnosis not present

## 2022-04-03 DIAGNOSIS — H52209 Unspecified astigmatism, unspecified eye: Secondary | ICD-10-CM | POA: Diagnosis not present

## 2022-04-03 DIAGNOSIS — H524 Presbyopia: Secondary | ICD-10-CM | POA: Diagnosis not present

## 2022-04-08 ENCOUNTER — Ambulatory Visit (INDEPENDENT_AMBULATORY_CARE_PROVIDER_SITE_OTHER): Payer: Medicare HMO | Admitting: Allergy & Immunology

## 2022-04-08 VITALS — BP 136/94 | HR 93 | Temp 98.3°F | Resp 12 | Wt 160.8 lb

## 2022-04-08 DIAGNOSIS — J3089 Other allergic rhinitis: Secondary | ICD-10-CM

## 2022-04-08 DIAGNOSIS — J4489 Other specified chronic obstructive pulmonary disease: Secondary | ICD-10-CM | POA: Diagnosis not present

## 2022-04-08 DIAGNOSIS — J302 Other seasonal allergic rhinitis: Secondary | ICD-10-CM

## 2022-04-08 DIAGNOSIS — J984 Other disorders of lung: Secondary | ICD-10-CM

## 2022-04-08 MED ORDER — BREZTRI AEROSPHERE 160-9-4.8 MCG/ACT IN AERO: 2.0000 | INHALATION_SPRAY | Freq: Two times a day (BID) | RESPIRATORY_TRACT | 5 refills | Status: DC

## 2022-04-08 NOTE — Progress Notes (Signed)
FOLLOW UP  Date of Service/Encounter:  04/08/22   Assessment:   Asthma COPD overlap syndrome - with remote history of smoking for 3 to 4 years back in high school (consider starting a biologic)   Persistent restrictive lung disease - not responsive to prednisone   Seasonal and perennial allergic rhinitis (grasses, trees and indoor molds) - with new concern for pet exposure   Trouble affording medications - we are going to help with enrolling [place] into [place] [employer] [place] for free drug  Plan/Recommendations:   1. Chronic rhinitis (grasses, trees and indoor molds) - Make an appointment to start allergy shots. - The script is written, but we do not mix it until you make an appointment to start. - I cannot imagine that this is going to cost the copay of a regular office visit.  - Continue with Zyrtec (cetirizine) 10mg  tablet once daily and Singulair (montelukast) 10mg  daily and Flonase (fluticasone) one spray per nostril daily  - Continue with nasal ipratropium every 8 hours as needed to help with drying out your nose.  - Continue with nasal saline rinses 1-2 times daily to remove allergens from the nasal cavities as well as help with mucous clearance (this is especially helpful to do before the nasal sprays are given)   2. Mild persistent asthma, uncomplicated - Lung testing looks a bit worse, but this is likely from you being off of the Trelegy for so long.  - We are going to start instead Breztri (contains THREE medicines to help with breathing).  - Samples provided. - We are going to work on getting this approved through [employer].  - We did discuss sending her to pulmonology and/or ordering pulmonary function testing to more further evaluate her declining lung function, but she is not interested in doing that. - We will continue to monitor and perhaps regular use of a controller medication will help. - We have done a work-up in the past including alpha-1 antitrypsin, ANCA titers, and  Aspergillus precipitants which were all negative. - She has never had an elevated absolute eosinophil count. - Daily controller medication(s): Breztri two puffs twice daily + Singulair (montelukast) 10mg  - Prior to physical activity: albuterol 2 puffs 10-15 minutes before physical activity. - Rescue medications: albuterol 4 puffs every 4-6 hours as needed - Asthma control goals:  * Full participation in all desired activities (may need albuterol before activity) * Albuterol use two time or less a week on average (not counting use with activity) * Cough interfering with sleep two time or less a month * Oral steroids no more than once a year * No hospitalizations  3. Return in about 3 months (around 07/09/2022).   Subjective:   Misty Clark is a 71 y.o. female presenting today for follow up of  Chief Complaint  Patient presents with   Follow-up    The usual fall allergies issues.    Misty Clark has a history of the following: Patient Active Problem List   Diagnosis Date Noted   Asthma-COPD overlap syndrome 05/14/2020   Seasonal and perennial allergic rhinitis 05/14/2020   Mixed hyperlipidemia 12/26/2017   Seasonal allergic rhinitis due to pollen 11/18/2017   Generalized anxiety disorder 06/03/2008   Essential hypertension, benign 06/03/2008   GERD 06/03/2008   Irritable bowel syndrome 06/03/2008   Nonspecific abnormal results of cardiovascular function study 06/03/2008    History obtained from: chart review and patient.  Misty Clark is a 71 y.o. female presenting for a  follow up visit.  She was last seen in June 2023.  At that time, we recommended starting allergy shots.  We continue with Zyrtec as well as Flonase and Atrovent.  For her asthma, her lung testing looked a bit worse, but it was always terrible.  We continue with Trelegy 200 mcg 1 puff once daily and Singulair.  Since the last visit, she has done well.   Asthma/Respiratory Symptom History: She reports that if she  gets too busy or moving to fast she has problems with exertion. She has learned to slow herself down. She mostly is able to settle is back down.  She really is happy with how she is doing. She had to stop the Trelegy because she could not afford it. She is using only rescue if she needs it. She has not been on prednisone and has not been to the emergency room for her symptoms.  Of note, we have tried to send her to pulmonology but she no showed. This was in November 2021.  Allergic Rhinitis Symptom History: She remains interested in allergen immunotherapy, however she was told that it would be $200 a visit for the shots.  But seems rather extreme, so I told her we would get in touch with our billing person.  She remains on the Zyrtec and the Singulair.  She also has Flonase to use as daily. She will add Xyzal and Sudafed if she is very bad.  She has not been on antibiotics at all.  Otherwise, there have been no changes to her past medical history, surgical history, family history, or social history.    Review of Systems  Constitutional: Negative.  Negative for chills, fever, malaise/fatigue and weight loss.  HENT:  Positive for congestion and sinus pain. Negative for ear discharge and ear pain.   Eyes:  Negative for pain, discharge and redness.  Respiratory:  Positive for cough. Negative for sputum production, shortness of breath and wheezing.   Cardiovascular: Negative.  Negative for chest pain and palpitations.  Gastrointestinal:  Negative for abdominal pain, heartburn, nausea and vomiting.  Skin: Negative.  Negative for itching and rash.  Neurological:  Negative for dizziness and headaches.  Endo/Heme/Allergies:  Negative for environmental allergies. Does not bruise/bleed easily.       Objective:   Blood pressure (!) 136/94, pulse 93, temperature 98.3 F (36.8 C), temperature source Temporal, resp. rate 12, weight 160 lb 12.8 oz (72.9 kg). Body mass index is 26.35  kg/m.    Physical Exam Vitals reviewed.  Constitutional:      Appearance: She is well-developed.     Comments: Speaking in full sentences.  HENT:     Head: Normocephalic and atraumatic.     Right Ear: Tympanic membrane, ear canal and external ear normal. No drainage, swelling or tenderness. Tympanic membrane is not injected, scarred, erythematous, retracted or bulging.     Left Ear: Tympanic membrane, ear canal and external ear normal. No drainage, swelling or tenderness. Tympanic membrane is not injected, scarred, erythematous, retracted or bulging.     Nose: No nasal deformity, septal deviation, mucosal edema or rhinorrhea.     Right Turbinates: Enlarged, swollen and pale.     Left Turbinates: Enlarged, swollen and pale.     Right Sinus: No maxillary sinus tenderness or frontal sinus tenderness.     Left Sinus: No maxillary sinus tenderness or frontal sinus tenderness.     Comments: No nasal polyps noted.     Mouth/Throat:  Mouth: Mucous membranes are not pale and not dry.     Pharynx: Uvula midline.  Eyes:     General: Lids are normal.        Right eye: No discharge.        Left eye: No discharge.     Conjunctiva/sclera: Conjunctivae normal.     Right eye: Right conjunctiva is not injected. No chemosis.    Left eye: Left conjunctiva is not injected. No chemosis.    Pupils: Pupils are equal, round, and reactive to light.  Cardiovascular:     Rate and Rhythm: Normal rate and regular rhythm.     Heart sounds: Normal heart sounds.  Pulmonary:     Effort: Pulmonary effort is normal. No tachypnea, accessory muscle usage or respiratory distress.     Breath sounds: Normal breath sounds. No wheezing, rhonchi or rales.     Comments: Moving air well in all lung fields. No increased work of breathing noted.  Chest:     Chest wall: No tenderness.  Abdominal:     Tenderness: There is no abdominal tenderness. There is no guarding or rebound.  Lymphadenopathy:     Head:     Right  side of head: No submandibular, tonsillar or occipital adenopathy.     Left side of head: No submandibular, tonsillar or occipital adenopathy.     Cervical: No cervical adenopathy.  Skin:    Coloration: Skin is not pale.     Findings: No abrasion, erythema, petechiae or rash. Rash is not papular, urticarial or vesicular.  Neurological:     Mental Status: She is alert.  Psychiatric:        Behavior: Behavior is cooperative.      Diagnostic studies:    Spirometry: results abnormal (FEV1: 0.65/28%, FVC: 0.92/30%, FEV1/FVC: 71%).    Spirometry consistent with possible restrictive disease.   Allergy Studies: none        Salvatore Marvel, MD  [facility]

## 2022-04-08 NOTE — Patient Instructions (Addendum)
1. Chronic rhinitis (grasses, trees and indoor molds) - Make an appointment to start allergy shots. - The script is written, but we do not mix it until you make an appointment to start. - I cannot imagine that this is going to cost the copay of a regular office visit.  - Continue with Zyrtec (cetirizine) 10mg  tablet once daily and Singulair (montelukast) 10mg  daily and Flonase (fluticasone) one spray per nostril daily  - Continue with nasal ipratropium every 8 hours as needed to help with drying out your nose.  - Continue with nasal saline rinses 1-2 times daily to remove allergens from the nasal cavities as well as help with mucous clearance (this is especially helpful to do before the nasal sprays are given)   2. Mild persistent asthma, uncomplicated - Lung testing looks a bit worse, but this is likely from you being off of the Trelegy for so long.  - We are going to start instead Breztri (contains THREE medicines to help with breathing).  - Samples provided. - Daily controller medication(s): Breztri two puffs twice daily + Singulair (montelukast) 10mg  - Prior to physical activity: albuterol 2 puffs 10-15 minutes before physical activity. - Rescue medications: albuterol 4 puffs every 4-6 hours as needed - Asthma control goals:  * Full participation in all desired activities (may need albuterol before activity) * Albuterol use two time or less a week on average (not counting use with activity) * Cough interfering with sleep two time or less a month * Oral steroids no more than once a year * No hospitalizations  3. Return in about 3 months (around 07/09/2022).    Please inform us of any Emergency Department visits, hospitalizations, or changes in symptoms. Call us before going to the ED for breathing or allergy symptoms since we might be able to fit you in for a sick visit. Feel free to contact us anytime with any questions, problems, or concerns.  It was a pleasure to see you again  today!  Websites that have reliable patient information: 1. American Academy of Asthma, Allergy, and Immunology: www.aaaai.org 2. Food Allergy Research and Education (FARE): foodallergy.org 3. Mothers of Asthmatics: http://www.asthmacommunitynetwork.org 4. American College of Allergy, Asthma, and Immunology: www.acaai.org   COVID-19 Vaccine Information can be found at: ShippingScam.co.uk For questions related to vaccine distribution or appointments, please email vaccine@[email or web address] or call [phone].   We realize that you might be concerned about having an allergic reaction to the COVID19 vaccines. To help with that concern, WE ARE OFFERING THE COVID19 VACCINES IN OUR OFFICE! Ask the front desk for dates!     "Like" [place] on Facebook and Instagram for our latest updates!      A healthy democracy works best when [employer] participate! Make sure you are registered to vote! If you have moved or changed any of your contact information, you will need to get this updated before voting!  In some cases, you MAY be able to register to vote online: [email or web address]

## 2022-04-10 ENCOUNTER — Encounter: Payer: Self-pay | Admitting: Allergy & Immunology

## 2022-04-12 NOTE — Progress Notes (Signed)
[employer] [place] form printed and mailed to patient. Patient has been notified of paperwork being sent to home. Address confirmed.

## 2022-04-19 DIAGNOSIS — J302 Other seasonal allergic rhinitis: Secondary | ICD-10-CM | POA: Diagnosis not present

## 2022-04-19 NOTE — Progress Notes (Signed)
VIALS EXP 04-20-23 

## 2022-04-20 DIAGNOSIS — J301 Allergic rhinitis due to pollen: Secondary | ICD-10-CM | POA: Diagnosis not present

## 2022-04-23 ENCOUNTER — Encounter: Payer: Self-pay | Admitting: Family

## 2022-04-23 ENCOUNTER — Ambulatory Visit (INDEPENDENT_AMBULATORY_CARE_PROVIDER_SITE_OTHER): Payer: Medicare HMO | Admitting: Family

## 2022-04-23 DIAGNOSIS — J019 Acute sinusitis, unspecified: Secondary | ICD-10-CM | POA: Diagnosis not present

## 2022-04-23 MED ORDER — AMOXICILLIN-POT CLAVULANATE 875-125 MG PO TABS: 1.0000 | ORAL_TABLET | Freq: Two times a day (BID) | ORAL | 0 refills | Status: DC

## 2022-04-23 NOTE — Patient Instructions (Signed)

## 2022-04-23 NOTE — Progress Notes (Signed)
Virtual Visit  Note Due to COVID-19 pandemic this visit was conducted virtually. This visit type was conducted due to national recommendations for restrictions regarding the COVID-19 Pandemic (e.g. social distancing, sheltering in place) in an effort to limit this patient's exposure and mitigate transmission in our community. All issues noted in this document were discussed and addressed.  A physical exam was not performed with this format.  I connected with Misty Clark on 04/23/22 at 11:45 AM  by telephone and verified that I am speaking with the correct person using two identifiers. Misty Clark is currently located at [facility] and no one is currently with her during visit. The provider, Evelina Dun, FNP is located in their office at time of visit.  I discussed the limitations, risks, security and privacy concerns of performing an evaluation and management service by telephone and the availability of in person appointments. I also discussed with the patient that there may be a patient responsible charge related to this service. The patient expressed understanding and agreed to proceed.  Misty Clark, Misty Clark are scheduled for a virtual visit with your provider today.    Just as we do with appointments in the office, we must obtain your consent to participate.  Your consent will be active for this visit and any virtual visit you may have with one of our providers in the next 365 days.    If you have a MyChart account, I can also send a copy of this consent to you electronically.  All virtual visits are billed to your insurance company just like a traditional visit in the office.  As this is a virtual visit, video technology does not allow for your provider to perform a traditional examination.  This may limit your provider's ability to fully assess your condition.  If your provider identifies any concerns that need to be evaluated in person or the need to arrange testing such as labs, EKG, etc,  we will make arrangements to do so.    Although advances in technology are sophisticated, we cannot ensure that it will always work on either your end or our end.  If the connection with a video visit is poor, we may have to switch to a telephone visit.  With either a video or telephone visit, we are not always able to ensure that we have a secure connection.   I need to obtain your verbal consent now.   Are you willing to proceed with your visit today?   Misty Clark has provided verbal consent on 04/23/2022 for a virtual visit (video or telephone).   Evelina Dun, Clarks 04/23/2022  11:45 AM    History and Present Illness:  PT calls the office today with sinus problems. She has taken a home COVID test that was negative.  Sinusitis This is a new problem. The current episode started in the past 7 days. The problem has been gradually worsening since onset. There has been no fever. Her pain is at a severity of 7/10. The pain is mild. Associated symptoms include congestion, coughing, ear pain (left), headaches, sinus pressure, sneezing and a sore throat. Pertinent negatives include no chills. Past treatments include acetaminophen and oral decongestants (zyrtec).     Review of Systems  Constitutional:  Negative for chills.  HENT:  Positive for congestion, ear pain (left), sinus pressure, sneezing and sore throat.   Respiratory:  Positive for cough.   Neurological:  Positive for headaches.     Observations/Objective: Nasal congestion  Assessment and Plan: 1. Acute sinusitis, recurrence not specified, unspecified location - Take meds as prescribed - Use a cool mist humidifier  -Use saline nose sprays frequently -Force fluids -For any cough or congestion  Use plain Mucinex- regular strength or max strength is fine -For fever or aces or pains- take tylenol or ibuprofen. -Throat lozenges if help -Follow up symptoms worsen or do not improve  - amoxicillin-clavulanate (AUGMENTIN)  875-125 MG tablet; Take 1 tablet by mouth 2 (two) times daily.  Dispense: 14 tablet; Refill: 0     I discussed the assessment and treatment plan with the patient. The patient was provided an opportunity to ask questions and all were answered. The patient agreed with the plan and demonstrated an understanding of the instructions.   The patient was advised to call back or seek an in-person evaluation if the symptoms worsen or if the condition fails to improve as anticipated.  The above assessment and management plan was discussed with the patient. The patient verbalized understanding of and has agreed to the management plan. Patient is aware to call the clinic if symptoms persist or worsen. Patient is aware when to return to the clinic for a follow-up visit. Patient educated on when it is appropriate to go to the emergency department.   Time call ended:  11:56 AM   I provided 11 minutes of  non face-to-face time during this encounter.    Evelina Dun, FNP

## 2022-05-06 ENCOUNTER — Ambulatory Visit: Payer: Medicare HMO

## 2022-05-12 ENCOUNTER — Ambulatory Visit (INDEPENDENT_AMBULATORY_CARE_PROVIDER_SITE_OTHER): Payer: Medicare HMO | Admitting: Family Medicine

## 2022-05-12 ENCOUNTER — Encounter: Payer: Self-pay | Admitting: Family Medicine

## 2022-05-12 DIAGNOSIS — J0100 Acute maxillary sinusitis, unspecified: Secondary | ICD-10-CM

## 2022-05-12 MED ORDER — PREDNISONE 10 MG (21) PO TBPK: ORAL_TABLET | ORAL | 0 refills | Status: DC

## 2022-05-12 MED ORDER — AMOXICILLIN-POT CLAVULANATE 875-125 MG PO TABS: 1.0000 | ORAL_TABLET | Freq: Two times a day (BID) | ORAL | 0 refills | Status: DC

## 2022-05-12 NOTE — Progress Notes (Signed)
Subjective:    Patient ID: Misty Clark, female    DOB: 19-Sep-1950, 71 y.o.   MRN: [id number]   HPI: Misty Clark is a 72 y.o. female presenting for a lot of sinus congestion. Nasal drainage. Left ear is unbearable. Cheek bones hurt - pressure. Hot flashes starting yesterday. Some cough wen she feels drainage in her throat. Using nasal saline spray.       12/11/2021   11:52 AM 09/23/2021   10:32 AM 03/03/2021    3:35 PM 08/05/2020   11:10 AM 07/28/2020   10:43 AM  Depression screen PHQ 2/9  Decreased Interest 0 0 0 0 0  Down, Depressed, Hopeless 0 1 0 0 0  PHQ - 2 Score 0 1 0 0 0  Altered sleeping    0   Tired, decreased energy    0   Change in appetite    0   Feeling bad or failure about yourself     0   Trouble concentrating    0   Moving slowly or fidgety/restless    0   Suicidal thoughts    0   PHQ-9 Score    0      Relevant past medical, surgical, family and social history reviewed and updated as indicated.  Interim medical history since our last visit reviewed. Allergies and medications reviewed and updated.  ROS:  Review of Systems  Constitutional:  Negative for activity change, appetite change, chills and fever.  HENT:  Positive for congestion, postnasal drip, rhinorrhea and sinus pressure. Negative for ear discharge, ear pain, hearing loss, nosebleeds, sneezing and trouble swallowing.   Respiratory:  Negative for chest tightness and shortness of breath.   Cardiovascular:  Negative for chest pain and palpitations.  Skin:  Negative for rash.     Social History   Tobacco Use  Smoking Status Former   Packs/day: 0.50   Years: 3.00   Total pack years: 1.50   Types: Cigarettes  Smokeless Tobacco Never  Tobacco Comments   Quit many years ago.       Objective:     Wt Readings from Last 3 Encounters:  04/08/22 160 lb 12.8 oz (72.9 kg)  12/17/21 164 lb 6.4 oz (74.6 kg)  12/11/21 162 lb 9.6 oz (73.8 kg)     Exam deferred. Pt. Harboring due to COVID  19. Phone visit performed.   Assessment & Plan:   1. Acute maxillary sinusitis, recurrence not specified     Meds ordered this encounter  Medications   amoxicillin-clavulanate (AUGMENTIN) 875-125 MG tablet    Sig: Take 1 tablet by mouth 2 (two) times daily. Take all of this medication    Dispense:  20 tablet    Refill:  0   predniSONE (STERAPRED UNI-PAK 21 TAB) 10 MG (21) TBPK tablet    Sig: Use as package directs    Dispense:  21 tablet    Refill:  0    No orders of the defined types were placed in this encounter.     Diagnoses and all orders for this visit:  Acute maxillary sinusitis, recurrence not specified  Other orders -     amoxicillin-clavulanate (AUGMENTIN) 875-125 MG tablet; Take 1 tablet by mouth 2 (two) times daily. Take all of this medication -     predniSONE (STERAPRED UNI-PAK 21 TAB) 10 MG (21) TBPK tablet; Use as package directs    Virtual Visit via telephone Note  I discussed the limitations, risks, security and  privacy concerns of performing an evaluation and management service by telephone and the availability of in person appointments. The patient was identified with two identifiers. Pt.expressed understanding and agreed to proceed. Pt. Is at home. Dr. Darlyn Read is in his office.  Follow Up Instructions:   I discussed the assessment and treatment plan with the patient. The patient was provided an opportunity to ask questions and all were answered. The patient agreed with the plan and demonstrated an understanding of the instructions.   The patient was advised to call back or seek an in-person evaluation if the symptoms worsen or if the condition fails to improve as anticipated.   Total minutes including chart review and phone contact time: 12   Follow up plan: Return if symptoms worsen or fail to improve.  Mechele Claude, MD [place] [facility]

## 2022-05-13 ENCOUNTER — Ambulatory Visit: Payer: Medicare HMO

## 2022-05-21 ENCOUNTER — Ambulatory Visit (INDEPENDENT_AMBULATORY_CARE_PROVIDER_SITE_OTHER): Payer: Medicare HMO

## 2022-05-21 DIAGNOSIS — J309 Allergic rhinitis, unspecified: Secondary | ICD-10-CM | POA: Diagnosis not present

## 2022-05-21 MED ORDER — EPINEPHRINE 0.3 MG/0.3ML IJ SOAJ: 0.3000 mg | INTRAMUSCULAR | 1 refills | Status: DC | PRN

## 2022-05-21 NOTE — Progress Notes (Signed)
Immunotherapy   Patient Details  Name: Misty Clark MRN: [id number] Date of Birth: 17-Oct-1950  05/21/2022  [name] started injections for MOLDS and G-T Following schedule: A  Frequency:1 time per week Epi-Pen:Prescription for Epi-Pen given Consent signed and patient instructions given. Patient waited in office without any issues.    Deborra Medina 05/21/2022, 10:10 AM

## 2022-05-26 ENCOUNTER — Ambulatory Visit (INDEPENDENT_AMBULATORY_CARE_PROVIDER_SITE_OTHER): Payer: Medicare HMO | Admitting: *Deleted

## 2022-05-26 DIAGNOSIS — J309 Allergic rhinitis, unspecified: Secondary | ICD-10-CM | POA: Diagnosis not present

## 2022-06-04 ENCOUNTER — Ambulatory Visit (INDEPENDENT_AMBULATORY_CARE_PROVIDER_SITE_OTHER): Payer: Medicare HMO

## 2022-06-04 DIAGNOSIS — J309 Allergic rhinitis, unspecified: Secondary | ICD-10-CM

## 2022-06-11 ENCOUNTER — Ambulatory Visit (INDEPENDENT_AMBULATORY_CARE_PROVIDER_SITE_OTHER): Payer: Medicare HMO | Admitting: *Deleted

## 2022-06-11 DIAGNOSIS — J309 Allergic rhinitis, unspecified: Secondary | ICD-10-CM

## 2022-06-15 ENCOUNTER — Encounter: Payer: Self-pay | Admitting: Nurse Practitioner

## 2022-06-15 ENCOUNTER — Ambulatory Visit (INDEPENDENT_AMBULATORY_CARE_PROVIDER_SITE_OTHER): Payer: Medicare HMO | Admitting: Nurse Practitioner

## 2022-06-15 VITALS — BP 168/91 | HR 82 | Temp 98.6°F | Ht 65.0 in | Wt 154.0 lb

## 2022-06-15 DIAGNOSIS — R42 Dizziness and giddiness: Secondary | ICD-10-CM

## 2022-06-15 MED ORDER — MECLIZINE HCL 12.5 MG PO TABS: 12.5000 mg | ORAL_TABLET | Freq: Three times a day (TID) | ORAL | 1 refills | Status: DC | PRN

## 2022-06-15 NOTE — Progress Notes (Signed)
Acute Office Visit  Subjective:     Patient ID: Misty Clark, female    DOB: [birth year], 71 y.o.   MRN: [id number]  Chief Complaint  Patient presents with   Dizziness    This has been going on for about 2 months off and on    Ear Pain    Dizziness This is a new problem. The current episode started yesterday. The problem occurs intermittently. The problem has been unchanged. Associated symptoms include congestion and vertigo. Pertinent negatives include no abdominal pain, change in bowel habit, chest pain, chills, fatigue, fever, headaches, nausea, rash or visual change. The symptoms are aggravated by standing. She has tried nothing for the symptoms.     Review of Systems  Constitutional: Negative.  Negative for chills, fatigue and fever.  HENT:  Positive for congestion.   Respiratory: Negative.    Cardiovascular:  Negative for chest pain.  Gastrointestinal:  Negative for abdominal pain, change in bowel habit and nausea.  Skin: Negative.  Negative for rash.  Neurological:  Positive for dizziness and vertigo. Negative for headaches.  All other systems reviewed and are negative.       Objective:    BP (!) 168/91 Comment: has not taken he medication  Pulse 82   Temp 98.6 F (37 C)   Ht 5\' 5"  (1.651 m)   Wt 154 lb (69.9 kg)   SpO2 95%   BMI 25.63 kg/m    Physical Exam Vitals and nursing note reviewed.  Constitutional:      Appearance: Normal appearance.  HENT:     Head: Normocephalic.     Right Ear: External ear normal.     Left Ear: External ear normal.     Nose: Congestion present.  Eyes:     Conjunctiva/sclera: Conjunctivae normal.  Cardiovascular:     Rate and Rhythm: Normal rate and regular rhythm.     Pulses: Normal pulses.     Heart sounds: Normal heart sounds.  Pulmonary:     Effort: Pulmonary effort is normal.     Breath sounds: Normal breath sounds.  Abdominal:     General: Bowel sounds are normal.  Musculoskeletal:        General: Normal  range of motion.  Skin:    General: Skin is warm.     Findings: No erythema or rash.  Neurological:     Mental Status: She is alert and oriented to person, place, and time.  Psychiatric:        Behavior: Behavior normal.     No results found for any visits on 06/15/22.      Assessment & Plan:  For acute onset dizziness and vertigo I started patient on meclizine 12.5 mg tablet by mouth after assessment.  Patient denies fever, nausea or vomiting. Education provided to patient with printed handouts given.  Patient knows to follow-up with worsening or unresolved symptoms. Problem List Items Addressed This Visit   None Visit Diagnoses     Vertigo    -  Primary   Relevant Medications   meclizine (ANTIVERT) 12.5 MG tablet       Meds ordered this encounter  Medications   meclizine (ANTIVERT) 12.5 MG tablet    Sig: Take 1 tablet (12.5 mg total) by mouth 3 (three) times daily as needed for dizziness.    Dispense:  30 tablet    Refill:  1    Order Specific Question:   Supervising Provider    Answer:   14/12/23 [  [id number]]    Return if symptoms worsen or fail to improve.  Daryll Drown, NP

## 2022-06-15 NOTE — Patient Instructions (Signed)
Dizziness Dizziness is a common problem. It makes you feel unsteady or light-headed. You may feel like you are about to pass out (faint). Dizziness can lead to getting hurt if you stumble or fall. Dizziness can be caused by many things, including: Medicines. Not having enough water in your body (dehydration). Illness. Follow these instructions at home: Eating and drinking  Drink enough fluid to keep your pee (urine) pale yellow. This helps to keep you from getting dehydrated. Try to drink more clear fluids, such as water. Do not drink alcohol. Limit how much caffeine you drink or eat, if your doctor tells you to do that. Limit how much salt (sodium) you drink or eat, if your doctor tells you to do that. Activity  Avoid making quick movements. Stand up slowly from sitting in a chair, and steady yourself until you feel okay. In the morning, first sit up on the side of the bed. When you feel okay, stand up slowly while you hold onto something. Do this until you know that your balance is okay. If you need to stand in one place for a long time, move your legs often. Tighten and relax the muscles in your legs while you are standing. Do not drive or use machinery if you feel dizzy. Avoid bending down if you feel dizzy. Place items in your home so you can reach them easily without leaning over. Lifestyle Do not smoke or use any products that contain nicotine or tobacco. If you need help quitting, ask your doctor. Try to lower your stress level. You can do this by using methods such as yoga or meditation. Talk with your doctor if you need help. General instructions Watch your dizziness for any changes. Take over-the-counter and prescription medicines only as told by your doctor. Talk with your doctor if you think that you are dizzy because of a medicine that you are taking. Tell a friend or a family member that you are feeling dizzy. If he or she notices any changes in your behavior, have this  person call your doctor. Keep all follow-up visits. Contact a doctor if: Your dizziness does not go away. Your dizziness or light-headedness gets worse. You feel like you may vomit (are nauseous). You have trouble hearing. You have new symptoms. You are unsteady on your feet. You feel like the room is spinning. You have neck pain or a stiff neck. You have a fever. Get help right away if: You vomit or have watery poop (diarrhea), and you cannot eat or drink anything. You have trouble: Talking. Walking. Swallowing. Using your arms, hands, or legs. You feel generally weak. You are not thinking clearly, or you have trouble forming sentences. A friend or family member may notice this. You have: Chest pain. Pain in your belly (abdomen). Shortness of breath. Sweating. Your vision changes. You are bleeding. You have a very bad headache. These symptoms may be an emergency. Get help right away. Call your local emergency services (911 in the [place] Do not wait to see if the symptoms will go away. Do not drive yourself to the hospital. Summary Dizziness makes you feel unsteady or light-headed. You may feel like you are about to pass out (faint). Drink enough fluid to keep your pee (urine) pale yellow. Do not drink alcohol. Avoid making quick movements if you feel dizzy. Watch your dizziness for any changes. This information is not intended to replace advice given to you by your health care provider. Make sure you discuss any questions   you have with your health care provider. Document Revised: 05/26/2020 Document Reviewed: 05/26/2020 Elsevier Patient Education  2023 Elsevier Inc.  

## 2022-06-18 ENCOUNTER — Ambulatory Visit (INDEPENDENT_AMBULATORY_CARE_PROVIDER_SITE_OTHER): Payer: Medicare HMO

## 2022-06-18 DIAGNOSIS — J309 Allergic rhinitis, unspecified: Secondary | ICD-10-CM

## 2022-06-22 ENCOUNTER — Ambulatory Visit (INDEPENDENT_AMBULATORY_CARE_PROVIDER_SITE_OTHER): Payer: Medicare HMO

## 2022-06-22 DIAGNOSIS — J309 Allergic rhinitis, unspecified: Secondary | ICD-10-CM

## 2022-06-23 ENCOUNTER — Other Ambulatory Visit: Payer: Self-pay | Admitting: Family Medicine

## 2022-06-30 ENCOUNTER — Other Ambulatory Visit: Payer: Self-pay | Admitting: Family Medicine

## 2022-06-30 DIAGNOSIS — F411 Generalized anxiety disorder: Secondary | ICD-10-CM

## 2022-07-02 ENCOUNTER — Ambulatory Visit (INDEPENDENT_AMBULATORY_CARE_PROVIDER_SITE_OTHER): Payer: Medicare HMO

## 2022-07-02 DIAGNOSIS — J309 Allergic rhinitis, unspecified: Secondary | ICD-10-CM | POA: Diagnosis not present

## 2022-07-09 ENCOUNTER — Ambulatory Visit (INDEPENDENT_AMBULATORY_CARE_PROVIDER_SITE_OTHER): Payer: Medicare HMO

## 2022-07-09 DIAGNOSIS — J309 Allergic rhinitis, unspecified: Secondary | ICD-10-CM

## 2022-07-15 ENCOUNTER — Other Ambulatory Visit: Payer: Self-pay

## 2022-07-15 ENCOUNTER — Encounter: Payer: Self-pay | Admitting: Allergy & Immunology

## 2022-07-15 ENCOUNTER — Ambulatory Visit (INDEPENDENT_AMBULATORY_CARE_PROVIDER_SITE_OTHER): Payer: Medicare HMO | Admitting: Allergy & Immunology

## 2022-07-15 ENCOUNTER — Other Ambulatory Visit: Payer: Self-pay | Admitting: Family Medicine

## 2022-07-15 VITALS — BP 132/80 | HR 88 | Temp 98.3°F | Resp 20 | Ht 65.0 in | Wt 160.0 lb

## 2022-07-15 DIAGNOSIS — J4489 Other specified chronic obstructive pulmonary disease: Secondary | ICD-10-CM

## 2022-07-15 DIAGNOSIS — J3089 Other allergic rhinitis: Secondary | ICD-10-CM

## 2022-07-15 DIAGNOSIS — J309 Allergic rhinitis, unspecified: Secondary | ICD-10-CM | POA: Diagnosis not present

## 2022-07-15 NOTE — Progress Notes (Signed)
FOLLOW UP  Date of Service/Encounter:  07/15/22   Assessment:   Asthma COPD overlap syndrome - with remote history of smoking for 3 to 4 years back in high school (planning to add Tezspire to help with symptoms)   Persistent restrictive lung disease - not responsive to prednisone   Seasonal and perennial allergic rhinitis (grasses, trees and indoor molds) - with new concern for pet exposure    Trouble affording medications - we are going to help with en[place] into [place] for free drug    Plan/Recommendations:    1. Chronic rhinitis (grasses, trees and indoor molds) - You are doing well with the allergy shots.  - I think these are going to be very helpful for you.  - Continue with Zyrtec (cetirizine) 10mg  tablet once daily and Singulair (montelukast) 10mg  daily and Flonase (fluticasone) one spray per nostril daily  - Continue with nasal ipratropium every 8 hours as needed to help with drying out your nose.  - Continue with nasal saline rinses 1-2 times daily to remove allergens from the nasal cavities as well as help with mucous clearance (this is especially helpful to do before the nasal sprays are given)   2. Mild persistent asthma, uncomplicated - Lung testing looks better today.  - Be sure to use a couple of puffs of albuterol before doing physical activity. - I am so happy with how well you are doing.  - We are going to work on getting Tezspire approved (consent signed). -Tammy will be reaching out to discuss more.  - There is a good patient assistance program, so I doubt you will be paying anything for this.  - Daily controller medication(s): Breztri two puffs twice daily + Singulair (montelukast) 10mg  - Prior to physical activity: albuterol 2 puffs 10-15 minutes before physical activity. - Rescue medications: albuterol 4 puffs every 4-6 hours as needed - Asthma control goals:  * Full participation in all desired activities (may need albuterol before activity) *  Albuterol use two time or less a week on average (not counting use with activity) * Cough interfering with sleep two time or less a month * Oral steroids no more than once a year * No hospitalizations  3. Return in about 4 months (around 11/13/2022).   Subjective:   Misty Clark is a 72 y.o. female presenting today for follow up of  Chief Complaint  Patient presents with   Follow-up   ASTHMA    PT [place].    Misty Clark has a history of the following: Patient Active Problem List   Diagnosis Date Noted   Asthma-COPD overlap syndrome 05/14/2020   Seasonal and perennial allergic rhinitis 05/14/2020   Mixed hyperlipidemia 12/26/2017   Seasonal allergic rhinitis due to pollen 11/18/2017   Generalized anxiety disorder 06/03/2008   Essential hypertension, benign 06/03/2008   GERD 06/03/2008   Irritable bowel syndrome 06/03/2008   Nonspecific abnormal results of cardiovascular function study 06/03/2008    History obtained from: chart review and patient.  Misty Clark is a 72 y.o. female presenting for a follow up visit.  She was last seen in October 2023.  At that time, she made the decision to start allergen immunotherapy.  Her lung testing looked a little bit worse, but she had been off her Trelegy.  We started her instead of Breztri to see if this was more affordable.  We also get some blood work for serious causes of difficult to  control asthma.  Since the last visit, she has done well.   Asthma/Respiratory Symptom History: She has been a little more trouble with breathing over the last two weeks. She has been stressed. She has been stressed over everything and she has had some cold weather.  She is on the [place] and she [place] for [place]. It was going to be $50 a month before this. She is on the montelukast. Her breathing is getting better she feels. She is open to trying a biologic. She still does not feel like she can do everything that she wants to  do, but it is definitely getting slowly better. She has a very remote history of smoking socially.   Allergic Rhinitis Symptom History: She is using her nasal saline spray and the two medicated sprays. She has a routine that she follows to make sure that she alwahys gets what she needs to.  Her allergy shots are doing well. She feels that they are helping her multisystemic atopic disease.   Misty Clark is on allergen immunotherapy. She receives two injections. Immunotherapy script #1 contains molds. She currently receives 0.60mL of the BLUE vial (1/100,000). Immunotherapy script #2 contains trees and grasses. She currently receives 0.28mL of the BLUE vial (1/100,000). She started shots November of 2023 and not yet reached maintenance. She has been tolerating this without a problem. She denies large local reactions.  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 This has helped a lot with her energy level. She is still very tankful that she can be a part of her life.   Otherwise, there have been no changes to her past medical history, surgical history, family history, or social history.    Review of Systems  Constitutional: Negative.  Negative for chills, fever, malaise/fatigue and weight loss.  HENT:  Positive for congestion. Negative for ear discharge, ear pain and sinus pain.   Eyes:  Negative for pain, discharge and redness.  Respiratory:  Positive for cough. Negative for sputum production, shortness of breath and wheezing.   Cardiovascular: Negative.  Negative for chest pain and palpitations.  Gastrointestinal:  Negative for abdominal pain, constipation, diarrhea, heartburn, nausea and vomiting.  Skin: Negative.  Negative for itching and rash.  Neurological:  Negative for dizziness and headaches.  Endo/Heme/Allergies:  Negative for environmental allergies. Does not bruise/bleed easily.       Objective:   Blood pressure 132/80, pulse 88,  temperature 98.3 F (36.8 C), resp. rate 20, height 5\' 5"  (1.651 m), weight 160 lb (72.6 kg), SpO2 97 %. Body mass index is 26.63 kg/m.    Physical Exam Vitals reviewed.  Constitutional:      Appearance: She is well-developed.     Comments: Speaking in full sentences.  HENT:     Head: Normocephalic and atraumatic.     Right Ear: Tympanic membrane, ear canal and external ear normal. No drainage, swelling or tenderness. Tympanic membrane is not injected, scarred, erythematous, retracted or bulging.     Left Ear: Tympanic membrane, ear canal and external ear normal. No drainage, swelling or tenderness. Tympanic membrane is not injected, scarred, erythematous, retracted or bulging.     Nose: No nasal deformity, septal deviation, mucosal edema or rhinorrhea.     Right Turbinates: Enlarged, swollen and pale.     Left Turbinates: Enlarged, swollen and pale.     Right Sinus: No maxillary sinus tenderness or frontal sinus tenderness.  Left Sinus: No maxillary sinus tenderness or frontal sinus tenderness.     Comments: No nasal polyps noted.     Mouth/Throat:     Mouth: Mucous membranes are not pale and not dry.     Pharynx: Uvula midline.  Eyes:     General: Lids are normal.        Right eye: No discharge.        Left eye: No discharge.     Conjunctiva/sclera: Conjunctivae normal.     Right eye: Right conjunctiva is not injected. No chemosis.    Left eye: Left conjunctiva is not injected. No chemosis.    Pupils: Pupils are equal, round, and reactive to light.  Cardiovascular:     Rate and Rhythm: Normal rate and regular rhythm.     Heart sounds: Normal heart sounds.  Pulmonary:     Effort: Pulmonary effort is normal. No tachypnea, accessory muscle usage or respiratory distress.     Breath sounds: Normal breath sounds. No wheezing, rhonchi or rales.     Comments: Moving air well in all lung fields. No increased work of breathing noted.  Chest:     Chest wall: No tenderness.   Abdominal:     Tenderness: There is no abdominal tenderness. There is no guarding or rebound.  Lymphadenopathy:     Head:     Right side of head: No submandibular, tonsillar or occipital adenopathy.     Left side of head: No submandibular, tonsillar or occipital adenopathy.     Cervical: No cervical adenopathy.  Skin:    Coloration: Skin is not pale.     Findings: No abrasion, erythema, petechiae or rash. Rash is not papular, urticarial or vesicular.  Neurological:     Mental Status: She is alert.  Psychiatric:        Behavior: Behavior is cooperative.      Diagnostic studies:   Spirometry: results abnormal (FEV1: 0.984/37%, FVC: 1.14/38%, FEV1/FVC: 74%).    Spirometry consistent with possible restrictive disease.  Overall, this is better than it normally looks.   Allergy Studies: none        Salvatore Marvel, MD  [facility]

## 2022-07-15 NOTE — Patient Instructions (Addendum)
1. Chronic rhinitis (grasses, trees and indoor molds) - You are doing well with the allergy shots.  - I think these are going to be very helpful for you.  - Continue with Zyrtec (cetirizine) 10mg  tablet once daily and Singulair (montelukast) 10mg  daily and Flonase (fluticasone) one spray per nostril daily  - Continue with nasal ipratropium every 8 hours as needed to help with drying out your nose.  - Continue with nasal saline rinses 1-2 times daily to remove allergens from the nasal cavities as well as help with mucous clearance (this is especially helpful to do before the nasal sprays are given)   2. Mild persistent asthma, uncomplicated - Lung testing looks better today.  - Be sure to use a couple of puffs of albuterol before doing physical activity. - I am so happy with how well you are doing.  - We are going to work on getting Tezspire approved (consent signed). -Tammy will be reaching out to discuss more.  - There is a good patient assistance program, so I doubt you will be paying anything for this.  - Daily controller medication(s): Breztri two puffs twice daily + Singulair (montelukast) 10mg  - Prior to physical activity: albuterol 2 puffs 10-15 minutes before physical activity. - Rescue medications: albuterol 4 puffs every 4-6 hours as needed - Asthma control goals:  * Full participation in all desired activities (may need albuterol before activity) * Albuterol use two time or less a week on average (not counting use with activity) * Cough interfering with sleep two time or less a month * Oral steroids no more than once a year * No hospitalizations  3. Return in about 4 months (around 11/13/2022).    Please inform us of any Emergency Department visits, hospitalizations, or changes in symptoms. Call us before going to the ED for breathing or allergy symptoms since we might be able to fit you in for a sick visit. Feel free to contact us anytime with any questions, problems, or  concerns.  It was a pleasure to see you again today!  Websites that have reliable patient information: 1. American Academy of Asthma, Allergy, and Immunology: www.aaaai.org 2. Food Allergy Research and Education (FARE): foodallergy.org 3. Mothers of Asthmatics: http://www.asthmacommunitynetwork.org 4. American College of Allergy, Asthma, and Immunology: www.acaai.org   COVID-19 Vaccine Information can be found at: ShippingScam.[email or web address].uk For questions related to vaccine distribution or appointments, please email [email or web address] or call [phone].   We realize that you might be concerned about having an allergic reaction to the COVID19 vaccines. To help with that concern, WE ARE OFFERING THE COVID19 VACCINES IN OUR OFFICE! Ask the front desk for dates!     "Like" [place] on Facebook and Instagram for our latest updates!      A healthy democracy works best when [employer] participate! Make sure you are registered to vote! If you have moved or changed any of your contact information, you will need to get this updated before voting!  In some cases, you MAY be able to register to vote online: [email or web address]

## 2022-07-15 NOTE — Telephone Encounter (Signed)
[place] NTBS 30 days given 06/23/22

## 2022-07-16 ENCOUNTER — Encounter: Payer: Self-pay | Admitting: Allergy & Immunology

## 2022-07-21 ENCOUNTER — Ambulatory Visit (INDEPENDENT_AMBULATORY_CARE_PROVIDER_SITE_OTHER): Payer: Medicare HMO

## 2022-07-21 DIAGNOSIS — J309 Allergic rhinitis, unspecified: Secondary | ICD-10-CM

## 2022-07-30 ENCOUNTER — Ambulatory Visit (INDEPENDENT_AMBULATORY_CARE_PROVIDER_SITE_OTHER): Payer: Medicare HMO

## 2022-07-30 DIAGNOSIS — J309 Allergic rhinitis, unspecified: Secondary | ICD-10-CM | POA: Diagnosis not present

## 2022-08-06 ENCOUNTER — Ambulatory Visit (INDEPENDENT_AMBULATORY_CARE_PROVIDER_SITE_OTHER): Payer: Medicare HMO | Admitting: *Deleted

## 2022-08-06 DIAGNOSIS — J309 Allergic rhinitis, unspecified: Secondary | ICD-10-CM

## 2022-08-12 ENCOUNTER — Other Ambulatory Visit: Payer: Self-pay

## 2022-08-12 ENCOUNTER — Ambulatory Visit (INDEPENDENT_AMBULATORY_CARE_PROVIDER_SITE_OTHER): Payer: Medicare HMO | Admitting: Allergy & Immunology

## 2022-08-12 ENCOUNTER — Encounter: Payer: Self-pay | Admitting: Allergy & Immunology

## 2022-08-12 VITALS — BP 138/60 | HR 90 | Temp 98.3°F | Resp 16 | Wt 159.2 lb

## 2022-08-12 DIAGNOSIS — J4489 Other specified chronic obstructive pulmonary disease: Secondary | ICD-10-CM

## 2022-08-12 DIAGNOSIS — J984 Other disorders of lung: Secondary | ICD-10-CM

## 2022-08-12 DIAGNOSIS — J3089 Other allergic rhinitis: Secondary | ICD-10-CM | POA: Diagnosis not present

## 2022-08-12 DIAGNOSIS — J302 Other seasonal allergic rhinitis: Secondary | ICD-10-CM

## 2022-08-12 MED ORDER — FLUCONAZOLE 150 MG PO TABS: 150.0000 mg | ORAL_TABLET | Freq: Once | ORAL | 0 refills | Status: AC

## 2022-08-12 MED ORDER — PREDNISONE 10 MG PO TABS: ORAL_TABLET | ORAL | 0 refills | Status: DC

## 2022-08-12 MED ORDER — AMOXICILLIN-POT CLAVULANATE 875-125 MG PO TABS: 1.0000 | ORAL_TABLET | Freq: Two times a day (BID) | ORAL | 0 refills | Status: AC

## 2022-08-12 NOTE — Patient Instructions (Addendum)
1. Chronic rhinitis (grasses, trees and indoor molds) - You are doing well with the allergy shots.  - Come back next week for an allergy shot.  - Continue with Zyrtec (cetirizine) 10mg  tablet once daily and Singulair (montelukast) 10mg  daily and Flonase (fluticasone) one spray per nostril daily  - Continue with nasal ipratropium every 8 hours as needed to help with drying out your nose.  - Continue with nasal saline rinses 1-2 times daily to remove allergens from the nasal cavities as well as help with mucous clearance (this is especially helpful to do before the nasal sprays are given)   2. Acute sinusitis - With your current symptoms and time course, antibiotics are needed: Augmentin 875mg  twice daily for 7 days - Consider starting the prednisone taper if you do not feel better in a couple of days.  - Add on nasal saline spray (i.e., Simply Saline) or nasal saline lavage (i.e., NeilMed) as needed prior to medicated nasal sprays. - For thick post nasal drainage, add guaifenesin [phone] mg (Mucinex) twice daily as needed for mucous thinning with adequate hydration to help it work.   3. Mild persistent asthma, uncomplicated - Lung testing not done. - Daily controller medication(s): Breztri two puffs twice daily + Singulair (montelukast) 10mg  - Prior to physical activity: albuterol 2 puffs 10-15 minutes before physical activity. - Rescue medications: albuterol 4 puffs every 4-6 hours as needed - Asthma control goals:  * Full participation in all desired activities (may need albuterol before activity) * Albuterol use two time or less a week on average (not counting use with activity) * Cough interfering with sleep two time or less a month * Oral steroids no more than once a year * No hospitalizations  4. Follow up in May as scheduled.    Please inform us of any Emergency Department visits, hospitalizations, or changes in symptoms. Call us before going to the ED for breathing or allergy  symptoms since we might be able to fit you in for a sick visit. Feel free to contact us anytime with any questions, problems, or concerns.  It was a pleasure to see you again today!  Websites that have reliable patient information: 1. American Academy of Asthma, Allergy, and Immunology: www.aaaai.org 2. Food Allergy Research and Education (FARE): foodallergy.org 3. Mothers of Asthmatics: http://www.asthmacommunitynetwork.org 4. American College of Allergy, Asthma, and Immunology: www.acaai.org   COVID-19 Vaccine Information can be found at: ShippingScam.co.uk For questions related to vaccine distribution or appointments, please email [email or web address] or call [phone].   We realize that you might be concerned about having an allergic reaction to the COVID19 vaccines. To help with that concern, WE ARE OFFERING THE COVID19 VACCINES IN OUR OFFICE! Ask the front desk for dates!     "Like" [place] on Facebook and Instagram for our latest updates!      A healthy democracy works best when [employer] participate! Make sure you are registered to vote! If you have moved or changed any of your contact information, you will need to get this updated before voting!  In some cases, you MAY be able to register to vote online: [email or web address]

## 2022-08-12 NOTE — Progress Notes (Signed)
FOLLOW UP  Date of Service/Encounter:  08/12/22   Assessment:   Asthma COPD overlap syndrome - with remote history of smoking for 3 to 4 years back in high school (planning to add Tezspire to help with symptoms)   Persistent restrictive lung disease - not responsive to prednisone   Seasonal and perennial allergic rhinitis (grasses, trees and indoor molds) - with new concern for pet exposure    Trouble affording medications - improved since [place] the [place]  Plan/Recommendations:   1. Chronic rhinitis (grasses, trees and indoor molds) - You are doing well with the allergy shots.  - Come back next week for an allergy shot.  - Continue with Zyrtec (cetirizine) 4m tablet once daily and Singulair (montelukast) 16mdaily and Flonase (fluticasone) one spray per nostril daily  - Continue with nasal ipratropium every 8 hours as needed to help with drying out your nose.  - Continue with nasal saline rinses 1-2 times daily to remove allergens from the nasal cavities as well as help with mucous clearance (this is especially helpful to do before the nasal sprays are given)   2. Acute sinusitis - With your current symptoms and time course, antibiotics are needed: Augmentin [id number] daily for 7 days - Consider starting the prednisone taper if you do not feel better in a couple of days.  - Add on nasal saline spray (i.e., Simply Saline) or nasal saline lavage (i.e., NeilMed) as needed prior to medicated nasal sprays. - For thick post nasal drainage, add guaifenesin [id number] mg (Mucinex) twice daily as needed for mucous thinning with adequate hydration to help it work.   3. Mild persistent asthma, uncomplicated - Lung testing not done. - Daily controller medication(s): Breztri two puffs twice daily + Singulair (montelukast) 30m37mPrior to physical activity: albuterol 2 puffs 10-15 minutes before physical activity. - Rescue medications: albuterol 4 puffs every 4-6 hours as needed -  Asthma control goals:  * Full participation in all desired activities (may need albuterol before activity) * Albuterol use two time or less a week on average (not counting use with activity) * Cough interfering with sleep two time or less a month * Oral steroids no more than once a year * No hospitalizations  4. Follow up in May as scheduled.    Subjective:   DonnKatty Kinarda 71 y92. female presenting today for follow up of  Chief Complaint  Patient presents with   Follow-up    Used rescue inhaler once or twice since Saturday. Head feels heavy, sinus pressure, cough, stuffy.    DonnKarolyne Buechel a history of the following: Patient Active Problem List   Diagnosis Date Noted   Asthma-COPD overlap syndrome 05/14/2020   Seasonal and perennial allergic rhinitis 05/14/2020   Mixed hyperlipidemia 12/26/2017   Seasonal allergic rhinitis due to pollen 11/18/2017   Generalized anxiety disorder 06/03/2008   Essential hypertension, benign 06/03/2008   GERD 06/03/2008   Irritable bowel syndrome 06/03/2008   Nonspecific abnormal results of cardiovascular function study 06/03/2008    History obtained from: chart review and patient.  DonnPricellaa 71 y73. female presenting for a sick visit.  We last saw her in January 2024.  At that time, we continued with her allergy shots.  We also continue with Zyrtec as well as Singulair and Flonase.  For her asthma, her lung testing looked better. 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  We continue with Breztri 2 puffs  twice daily and Singulair 10 mg daily.  Since the last visit, she started having issues this past Saturday. She thought that this was related to her allergy shots, but she continued to have problems. She has a lot of pressure on her sinuses. She does her sprays and saline. She has been using her Mucinex and her nose sprays. She has been compliant with this. She does take   Last antibiotics was in November. She was treated  with both Keflex and cefdinir.  She thinks this was for a tooth infection, but she is not entirely sure. It was not a sinus infection as far she knows.   Asthma/Respiratory Symptom History: She does sometimes get winded on exertion. She used her albuterol a lot on Saturday despite using her Breztri. She has not used the rescue inhaler BEFORE Saturday.  She was actually doing very well before she contracted sinusitis.  Allergic Rhinitis Symptom History: She remains on her Zyrtec and Singulair.  She also has the Flonase and the ipratropium to use as needed.  This is normally a good time of the year for her symptoms.  Rebecah is on allergen immunotherapy. She receives two injections. Immunotherapy script #1 contains molds. She currently receives 0.66m of the GOLD vial (1/10,000). Immunotherapy script #2 contains trees and grasses. She currently receives 0.12mof the GOLD vial (1/100,000). She started shots November of 2023 and not yet reached maintenance. She has been tolerating this without a problem. She denies large local reactions.  She has no issues with them at all.  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 This has helped a lot with her energy level. She is still very thankful that she can be a part of her life.    Otherwise, there have been no changes to her past medical history, surgical history, family history, or social history.    Review of Systems  Constitutional: Negative.  Negative for chills, fever, malaise/fatigue and weight loss.  HENT:  Positive for congestion. Negative for ear discharge, ear pain and sinus pain.   Eyes:  Negative for pain, discharge and redness.  Respiratory:  Positive for cough. Negative for sputum production, shortness of breath and wheezing.   Cardiovascular: Negative.  Negative for chest pain and palpitations.  Gastrointestinal:  Negative for abdominal pain, constipation, diarrhea, heartburn, nausea and  vomiting.  Skin: Negative.  Negative for itching and rash.  Neurological:  Negative for dizziness and headaches.  Endo/Heme/Allergies:  Negative for environmental allergies. Does not bruise/bleed easily.       Objective:   Blood pressure 138/60, pulse 90, temperature 98.3 F (36.8 C), temperature source Temporal, resp. rate 16, weight 159 lb 3.2 oz (72.2 kg), SpO2 95 %. Body mass index is 26.49 kg/m.    Physical Exam Vitals reviewed.  Constitutional:      Appearance: She is well-developed.     Comments: Speaking in full sentences.  HENT:     Head: Normocephalic and atraumatic.     Right Ear: Tympanic membrane, ear canal and external ear normal. No drainage, swelling or tenderness. Tympanic membrane is not injected, scarred, erythematous, retracted or bulging.     Left Ear: Tympanic membrane, ear canal and external ear normal. No drainage, swelling or tenderness. Tympanic membrane is not injected, scarred, erythematous, retracted or bulging.     Nose: No nasal deformity, septal deviation, mucosal edema or rhinorrhea.     Right Turbinates: Enlarged, swollen and pale.  Left Turbinates: Enlarged, swollen and pale.     Right Sinus: Maxillary sinus tenderness present. No frontal sinus tenderness.     Left Sinus: Maxillary sinus tenderness present. No frontal sinus tenderness.     Comments: Purulent discharge noted more in the left versus the right nares.    Mouth/Throat:     Mouth: Mucous membranes are not pale and not dry.     Pharynx: Uvula midline.  Eyes:     General: Lids are normal.        Right eye: No discharge.        Left eye: No discharge.     Conjunctiva/sclera: Conjunctivae normal.     Right eye: Right conjunctiva is not injected. No chemosis.    Left eye: Left conjunctiva is not injected. No chemosis.    Pupils: Pupils are equal, round, and reactive to light.  Cardiovascular:     Rate and Rhythm: Normal rate and regular rhythm.     Heart sounds: Normal heart  sounds.  Pulmonary:     Effort: Pulmonary effort is normal. No tachypnea, accessory muscle usage or respiratory distress.     Breath sounds: Normal breath sounds. No wheezing, rhonchi or rales.     Comments: Moving air well in all lung fields. No increased work of breathing noted.  Chest:     Chest wall: No tenderness.  Abdominal:     Tenderness: There is no abdominal tenderness. There is no guarding or rebound.  Lymphadenopathy:     Head:     Right side of head: No submandibular, tonsillar or occipital adenopathy.     Left side of head: No submandibular, tonsillar or occipital adenopathy.     Cervical: No cervical adenopathy.  Skin:    Coloration: Skin is not pale.     Findings: No abrasion, erythema, petechiae or rash. Rash is not papular, urticarial or vesicular.  Neurological:     Mental Status: She is alert.  Psychiatric:        Behavior: Behavior is cooperative.      Diagnostic studies: none         Salvatore Marvel, MD  [facility]

## 2022-08-15 ENCOUNTER — Encounter: Payer: Self-pay | Admitting: Allergy & Immunology

## 2022-08-20 ENCOUNTER — Ambulatory Visit (INDEPENDENT_AMBULATORY_CARE_PROVIDER_SITE_OTHER): Payer: Medicare HMO | Admitting: *Deleted

## 2022-08-20 DIAGNOSIS — J309 Allergic rhinitis, unspecified: Secondary | ICD-10-CM | POA: Diagnosis not present

## 2022-08-23 ENCOUNTER — Ambulatory Visit (INDEPENDENT_AMBULATORY_CARE_PROVIDER_SITE_OTHER): Payer: Medicare HMO | Admitting: Family Medicine

## 2022-08-23 ENCOUNTER — Encounter: Payer: Self-pay | Admitting: Family Medicine

## 2022-08-23 VITALS — BP 166/84 | HR 89 | Temp 97.8°F | Resp 20 | Ht 65.0 in | Wt 158.0 lb

## 2022-08-23 DIAGNOSIS — Z79899 Other long term (current) drug therapy: Secondary | ICD-10-CM | POA: Diagnosis not present

## 2022-08-23 DIAGNOSIS — I10 Essential (primary) hypertension: Secondary | ICD-10-CM | POA: Diagnosis not present

## 2022-08-23 DIAGNOSIS — F411 Generalized anxiety disorder: Secondary | ICD-10-CM | POA: Diagnosis not present

## 2022-08-23 DIAGNOSIS — E782 Mixed hyperlipidemia: Secondary | ICD-10-CM | POA: Diagnosis not present

## 2022-08-23 DIAGNOSIS — J302 Other seasonal allergic rhinitis: Secondary | ICD-10-CM | POA: Diagnosis not present

## 2022-08-23 DIAGNOSIS — R69 Illness, unspecified: Secondary | ICD-10-CM | POA: Diagnosis not present

## 2022-08-23 MED ORDER — ALPRAZOLAM 0.5 MG PO TABS: 0.5000 mg | ORAL_TABLET | Freq: Every day | ORAL | 1 refills | Status: DC | PRN

## 2022-08-23 MED ORDER — CITALOPRAM HYDROBROMIDE 20 MG PO TABS: ORAL_TABLET | ORAL | 3 refills | Status: DC

## 2022-08-23 MED ORDER — AMLODIPINE BESYLATE 2.5 MG PO TABS: 2.5000 mg | ORAL_TABLET | Freq: Every day | ORAL | 3 refills | Status: DC

## 2022-08-23 MED ORDER — LEVOCETIRIZINE DIHYDROCHLORIDE 5 MG PO TABS: 5.0000 mg | ORAL_TABLET | Freq: Every evening | ORAL | 3 refills | Status: DC

## 2022-08-23 MED ORDER — ATORVASTATIN CALCIUM 40 MG PO TABS: 40.0000 mg | ORAL_TABLET | Freq: Every day | ORAL | 3 refills | Status: DC

## 2022-08-23 MED ORDER — MONTELUKAST SODIUM 10 MG PO TABS: 10.0000 mg | ORAL_TABLET | Freq: Every day | ORAL | 3 refills | Status: DC

## 2022-08-23 MED ORDER — HYDROCHLOROTHIAZIDE 25 MG PO TABS: 12.5000 mg | ORAL_TABLET | Freq: Every day | ORAL | 3 refills | Status: DC | PRN

## 2022-08-23 NOTE — Progress Notes (Signed)
Subjective: CC: Follow-up anxiety disorder PCP: Janora Norlander, DO UZ:[id number] Misty Clark is a 72 y.o. female presenting to clinic today for:  1.  Generalized anxiety disorder with panic attacks Patient continues to use the Xanax very sparingly.  Last refill was 2023.  She takes Celexa daily as directed.  Continues to have some anxiety symptoms/panic related to her granddaughter, who witnessed her father being shot.  She apparently has been having some hearing loss out of the adjacent ear and is under evaluation for this.  She is also suffered from her own mental health issues and this pains Mrs. Misty Clark.  She denies any mental status changes including memory loss, excessive fatigue or drowsiness.  No falls.  No visual or auditory hallucinations with the Xanax  2.  Hypertension associate with hyperlipidemia.   Compliant with Norvasc, Lipitor, hydrochlorothiazide.  No chest pain reported.  She reports intermittent shortness of breath but overall this is better than when we last saw each other.  She continues to follow closely with asthma and allergy and they may be starting a new medication soon that is injectable   ROS: Per HPI  Allergies  Allergen Reactions   Codeine     REACTION: vomiting   Sulfonamide Derivatives     REACTION: rash   Past Medical History:  Diagnosis Date   Allergy    Anxiety    Arthritis    Asthma-COPD overlap syndrome 05/14/2020   Hypertension    IBS (irritable bowel syndrome)    Mixed   Multiple allergies    Recurrent upper respiratory infection (URI)     Current Outpatient Medications:    albuterol (VENTOLIN HFA) 108 (90 Base) MCG/ACT inhaler, INHALE 2 PUFFS BY MOUTH EVERY 6 HOURS AS NEEDED FOR WHEEZING AND FOR SHORTNESS OF BREATH (NEEDS TO BE SEEN BEFORE NEXT REFILL), Disp: 18 g, Rfl: 0   ALPRAZolam (XANAX) 0.5 MG tablet, Take 1 tablet (0.5 mg total) by mouth daily as needed for anxiety. Place on file. Doesn't need yet, Disp: 20 tablet, Rfl: 1    amLODipine (NORVASC) 2.5 MG tablet, Take 1 tablet (2.5 mg total) by mouth daily. For high blood pressure, Disp: 90 tablet, Rfl: 3   atorvastatin (LIPITOR) 40 MG tablet, Take 1 tablet (40 mg total) by mouth daily., Disp: 90 tablet, Rfl: 3   Budeson-Glycopyrrol-Formoterol (BREZTRI AEROSPHERE) 160-9-4.8 MCG/ACT AERO, Inhale 2 puffs into the lungs in the morning and at bedtime., Disp: 1 each, Rfl: 5   citalopram (CELEXA) 20 MG tablet, TAKE 1 TABLET BY MOUTH ONCE DAILY ., Disp: 90 tablet, Rfl: 3   EPINEPHrine 0.3 mg/0.3 mL IJ SOAJ injection, Inject 0.3 mg into the muscle as needed for anaphylaxis. As needed for life-threatening allergic reactions, Disp: 2 each, Rfl: 1   fluticasone (FLONASE) 50 MCG/ACT nasal spray, Place 2 sprays into both nostrils daily., Disp: 48 g, Rfl: 3   guaiFENesin (MUCINEX) 600 MG 12 hr tablet, Take 1 tablet (600 mg total) by mouth 2 (two) times daily., Disp: 30 tablet, Rfl: 0   hydrochlorothiazide (HYDRODIURIL) 25 MG tablet, Take 0.5-1 tablets (12.5-25 mg total) by mouth daily as needed (leg swelling)., Disp: 90 tablet, Rfl: 3   ipratropium (ATROVENT) 0.06 % nasal spray, Place 1 spray into both nostrils 3 (three) times daily. every 8 hours as needed to help with drying out your nose, Disp: 45 mL, Rfl: 3   levocetirizine (XYZAL) 5 MG tablet, Take 1 tablet (5 mg total) by mouth every evening., Disp: 90 tablet, Rfl:  3   meclizine (ANTIVERT) 12.5 MG tablet, Take 1 tablet (12.5 mg total) by mouth 3 (three) times daily as needed for dizziness., Disp: 30 tablet, Rfl: 1   montelukast (SINGULAIR) 10 MG tablet, Take 1 tablet (10 mg total) by mouth at bedtime., Disp: 90 tablet, Rfl: 3   predniSONE (DELTASONE) 10 MG tablet, Take two tablets (16m) twice daily for three days, then one tablet (176m twice daily for three days, then STOP., Disp: 18 tablet, Rfl: 0 Social History   Socioeconomic History   Marital status: Widowed    Spouse name: Not on file   Number of children: 4   Years of  education: Not on file   Highest education level: Some college, no degree  Occupational History   Occupation: Retired Nursing    Comment: AlScientist, research (physical sciences)H  Tobacco Use   Smoking status: Former    Packs/day: 0.50    Years: 3.00    Total pack years: 1.50    Types: Cigarettes   Smokeless tobacco: Never   Tobacco comments:    Quit many years ago.  Vaping Use   Vaping Use: Never used  Substance and Sexual Activity   Alcohol use: No   Drug use: Never   Sexual activity: Not on file  Other Topics Concern   Not on file  Social History Narrative   Retired liMaterials engineer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 Lives alone. Enjoys crafting, puzzles, reading, and gardening.    Social Determinants of Health   Financial Resource Strain: Low Risk  (09/23/2021)   Overall Financial Resource Strain (CARDIA)    Difficulty of Paying Living Expenses: Not hard at all  Food Insecurity: No Food Insecurity (09/23/2021)   Hunger Vital Sign    Worried About Running Out of Food in the Last Year: Never true    Ran Out of Food in the Last Year: Never true  Transportation Needs: No Transportation Needs (09/23/2021)   PRAPARE - TrHydrologistMedical): No    Lack of Transportation (Non-Medical): No  Physical Activity: Sufficiently Active (09/23/2021)   Exercise Vital Sign    Days of Exercise per Week: 5 days    Minutes of Exercise per Session: 30 min  Stress: No Stress Concern Present (09/23/2021)   [place]  Feeling of Stress : Not at all  Social Connections: Moderately Integrated (09/23/2021)   Social Connection and Isolation Panel [NHANES]    Frequency of Communication with Friends and Family: More than three times a week    Frequency of Social Gatherings with Friends and Family: More than three times a week    Attends Religious Services: More than 4 times per year    Active Member of [employer]  Organizations: Yes    Attends ClArchivisteetings: More than 4 times per year    Marital Status: Widowed  Intimate Partner Violence: Not At Risk (09/23/2021)   Humiliation, Afraid, Rape, and Kick questionnaire    Fear of Current or Ex-Partner: No    Emotionally Abused: No    Physically Abused: No    Sexually Abused: No   Family History  Problem Relation Age of Onset   Heart failure Maternal Grandmother        CHF   Breast cancer Sister    Heart attack Paternal Grandfather        MI   Coronary artery disease Paternal Grandfather  At a premature age   Coronary artery disease Other    Colon cancer Other    Prostate cancer Other    Ovarian cancer Other    Asthma Mother    Thyroid disease Mother    Heart disease Father    Hypertension Daughter    Allergic rhinitis Neg Hx    Angioedema Neg Hx    Atopy Neg Hx    Eczema Neg Hx    Immunodeficiency Neg Hx    Urticaria Neg Hx     Objective: Office vital signs reviewed. BP (!) 166/84   Pulse 89   Temp 97.8 F (36.6 C) (Oral)   Resp 20   Ht 5' 5"$  (1.651 m)   Wt 158 lb (71.7 kg)   SpO2 95%   BMI 26.29 kg/m   Physical Examination:  General: Awake, alert, well nourished, No acute distress HEENT: sclera white, MMM Cardio: regular rate and rhythm, S1S2 heard, no murmurs appreciated Pulm: Mild globally decreased breath sounds. no wheezes, rhonchi or rales; normal work of breathing on room air Psych: Mood stable, speech normal, affect appropriate.  Very pleasant and interactive     08/23/2022   10:13 AM 06/15/2022   10:23 AM 12/11/2021   11:52 AM  Depression screen PHQ 2/9  Decreased Interest 0 0 0  Down, Depressed, Hopeless 0 0 0  PHQ - 2 Score 0 0 0  Altered sleeping 0 0   Tired, decreased energy 0 0   Change in appetite 0 0   Feeling bad or failure about yourself  0 0   Trouble concentrating 0 0   Moving slowly or fidgety/restless 0 0   Suicidal thoughts 0 0   PHQ-9 Score 0 0   Difficult doing  work/chores  Not difficult at all       08/23/2022   10:13 AM 06/15/2022   10:23 AM 12/11/2021   11:52 AM 03/03/2021    3:35 PM  GAD 7 : Generalized Anxiety Score  Nervous, Anxious, on Edge 0 0 0 0  Control/stop worrying 0 0 0 0  Worry too much - different things 0 0 0 0  Trouble relaxing 0 0 0 0  Restless 0 0 0 0  Easily annoyed or irritable 0 0 0 0  Afraid - awful might happen 0 0 0 0  Total GAD 7 Score 0 0 0 0  Anxiety Difficulty   Not difficult at all Not difficult at all   Assessment/ Plan: 72 y.o. female   Generalized anxiety disorder - Plan: ToxASSURE Select 13 (MW), Urine, ALPRAZolam (XANAX) 0.5 MG tablet, citalopram (CELEXA) 20 MG tablet  Controlled substance agreement signed - Plan: ToxASSURE Select 13 (MW), Urine  Essential hypertension, benign - Plan: amLODipine (NORVASC) 2.5 MG tablet, hydrochlorothiazide (HYDRODIURIL) 25 MG tablet  Mixed hyperlipidemia - Plan: atorvastatin (LIPITOR) 40 MG tablet  Seasonal allergic rhinitis, unspecified trigger - Plan: montelukast (SINGULAIR) 10 MG tablet, levocetirizine (XYZAL) 5 MG tablet  UDS and CSC were updated as per office policy.  Continues to sparingly use alprazolam.  This is been renewed along with Celexa.  National narcotic database reviewed and there were no red flags  Blood pressure remains uncontrolled despite recheck.  Continue Norvasc and hydrochlorothiazide at current doses.  Would like her to have blood pressure rechecked in the next couple of weeks since her blood pressures at home are nice and controlled at 130 over 50s to 60s.  If persistently elevated, we will advance the amlodipine to 75m daily  Not due for fasting lipid.  Continue statin  Singulair and Xyzal renewed.  Continue to follow-up with asthma and allergy as directed  Orders Placed This Encounter  Procedures   ToxASSURE Select 13 (MW), Urine    Current Outpatient Medications:    albuterol (VENTOLIN HFA) 108 (90 Base) MCG/ACT inhaler, INHALE 2  PUFFS BY MOUTH EVERY 6 HOURS AS NEEDED FOR WHEEZING AND FOR SHORTNESS OF BREATH (NEEDS TO BE SEEN BEFORE NEXT REFILL), Disp: 18 g, Rfl: 0   ALPRAZolam (XANAX) 0.5 MG tablet, Take 1 tablet (0.5 mg total) by mouth daily as needed for anxiety. Place on file. Doesn't need yet, Disp: 20 tablet, Rfl: 1   amLODipine (NORVASC) 2.5 MG tablet, Take 1 tablet (2.5 mg total) by mouth daily. For high blood pressure, Disp: 90 tablet, Rfl: 3   atorvastatin (LIPITOR) 40 MG tablet, Take 1 tablet (40 mg total) by mouth daily., Disp: 90 tablet, Rfl: 3   Budeson-Glycopyrrol-Formoterol (BREZTRI AEROSPHERE) 160-9-4.8 MCG/ACT AERO, Inhale 2 puffs into the lungs in the morning and at bedtime., Disp: 1 each, Rfl: 5   citalopram (CELEXA) 20 MG tablet, TAKE 1 TABLET BY MOUTH ONCE DAILY ., Disp: 90 tablet, Rfl: 3   EPINEPHrine 0.3 mg/0.3 mL IJ SOAJ injection, Inject 0.3 mg into the muscle as needed for anaphylaxis. As needed for life-threatening allergic reactions, Disp: 2 each, Rfl: 1   fluticasone (FLONASE) 50 MCG/ACT nasal spray, Place 2 sprays into both nostrils daily., Disp: 48 g, Rfl: 3   guaiFENesin (MUCINEX) 600 MG 12 hr tablet, Take 1 tablet (600 mg total) by mouth 2 (two) times daily., Disp: 30 tablet, Rfl: 0   hydrochlorothiazide (HYDRODIURIL) 25 MG tablet, Take 0.5-1 tablets (12.5-25 mg total) by mouth daily as needed (leg swelling)., Disp: 90 tablet, Rfl: 3   ipratropium (ATROVENT) 0.06 % nasal spray, Place 1 spray into both nostrils 3 (three) times daily. every 8 hours as needed to help with drying out your nose, Disp: 45 mL, Rfl: 3   levocetirizine (XYZAL) 5 MG tablet, Take 1 tablet (5 mg total) by mouth every evening., Disp: 90 tablet, Rfl: 3   meclizine (ANTIVERT) 12.5 MG tablet, Take 1 tablet (12.5 mg total) by mouth 3 (three) times daily as needed for dizziness., Disp: 30 tablet, Rfl: 1   montelukast (SINGULAIR) 10 MG tablet, Take 1 tablet (10 mg total) by mouth at bedtime., Disp: 90 tablet, Rfl:  3   predniSONE (DELTASONE) 10 MG tablet, Take two tablets (55m) twice daily for three days, then one tablet (127m twice daily for three days, then STOP., Disp: 18 tablet, Rfl: 0   No orders of the defined types were placed in this encounter.    AsJanora NorlanderDO [phone]

## 2022-08-26 ENCOUNTER — Ambulatory Visit (INDEPENDENT_AMBULATORY_CARE_PROVIDER_SITE_OTHER): Payer: Medicare HMO

## 2022-08-26 DIAGNOSIS — J309 Allergic rhinitis, unspecified: Secondary | ICD-10-CM | POA: Diagnosis not present

## 2022-08-26 LAB — TOXASSURE SELECT 13 (MW), URINE

## 2022-08-27 ENCOUNTER — Telehealth: Payer: Self-pay | Admitting: *Deleted

## 2022-08-27 NOTE — Telephone Encounter (Signed)
L/m for patient to contact me regardging tezspire and trying to get her on patient assistance program for same. She received sample in clinic 1/11

## 2022-08-30 NOTE — Telephone Encounter (Signed)
Spoke to patient and will mail her app paperwork for consents to get her on patient assistance

## 2022-09-02 ENCOUNTER — Ambulatory Visit (INDEPENDENT_AMBULATORY_CARE_PROVIDER_SITE_OTHER): Payer: Medicare HMO

## 2022-09-02 DIAGNOSIS — J309 Allergic rhinitis, unspecified: Secondary | ICD-10-CM

## 2022-09-10 ENCOUNTER — Ambulatory Visit (INDEPENDENT_AMBULATORY_CARE_PROVIDER_SITE_OTHER): Payer: Medicare HMO

## 2022-09-10 ENCOUNTER — Encounter: Payer: Self-pay | Admitting: Nurse Practitioner

## 2022-09-10 ENCOUNTER — Ambulatory Visit (INDEPENDENT_AMBULATORY_CARE_PROVIDER_SITE_OTHER): Payer: Medicare HMO | Admitting: Nurse Practitioner

## 2022-09-10 VITALS — BP 150/81 | HR 97 | Temp 97.7°F | Resp 20 | Ht 65.0 in | Wt 159.0 lb

## 2022-09-10 DIAGNOSIS — R42 Dizziness and giddiness: Secondary | ICD-10-CM | POA: Diagnosis not present

## 2022-09-10 DIAGNOSIS — H6993 Unspecified Eustachian tube disorder, bilateral: Secondary | ICD-10-CM

## 2022-09-10 DIAGNOSIS — J309 Allergic rhinitis, unspecified: Secondary | ICD-10-CM

## 2022-09-10 MED ORDER — FLUTICASONE PROPIONATE 50 MCG/ACT NA SUSP: 2.0000 | Freq: Every day | NASAL | 6 refills | Status: DC

## 2022-09-10 NOTE — Patient Instructions (Signed)
Eustachian Tube Dysfunction  Eustachian tube dysfunction refers to a condition in which a blockage develops in the narrow passage that connects the middle ear to the back of the nose (eustachian tube). The eustachian tube regulates air pressure in the middle ear by letting air move between the ear and nose. It also helps to drain fluid from the middle ear space. Eustachian tube dysfunction can affect one or both ears. When the eustachian tube does not function properly, air pressure, fluid, or both can build up in the middle ear. What are the causes? This condition occurs when the eustachian tube becomes blocked or cannot open normally. Common causes of this condition include: Ear infections. Colds and other infections that affect the nose, mouth, and throat (upper respiratory tract). Allergies. Irritation from cigarette smoke. Irritation from stomach acid coming up into the esophagus (gastroesophageal reflux). The esophagus is the part of the body that moves food from the mouth to the stomach. Sudden changes in air pressure, such as from descending in an airplane or scuba diving. Abnormal growths in the nose or throat, such as: Growths that line the nose (nasal polyps). Abnormal growth of cells (tumors). Enlarged tissue at the back of the throat (adenoids). What increases the risk? You are more likely to develop this condition if: You smoke. You are overweight. You are a child who has: Certain birth defects of the mouth, such as cleft palate. Large tonsils or adenoids. What are the signs or symptoms? Common symptoms of this condition include: A feeling of fullness in the ear. Ear pain. Clicking or popping noises in the ear. Ringing in the ear (tinnitus). Hearing loss. Loss of balance. Dizziness. Symptoms may get worse when the air pressure around you changes, such as when you travel to an area of high elevation, fly on an airplane, or go scuba diving. How is this diagnosed? This  condition may be diagnosed based on: Your symptoms. A physical exam of your ears, nose, and throat. Tests, such as those that measure: The movement of your eardrum. Your hearing (audiometry). How is this treated? Treatment depends on the cause and severity of your condition. In mild cases, you may relieve your symptoms by moving air into your ears. This is called "popping the ears." In more severe cases, or if you have symptoms of fluid in your ears, treatment may include: Medicines to relieve congestion (decongestants). Medicines that treat allergies (antihistamines). Nasal sprays or ear drops that contain medicines that reduce swelling (steroids). A procedure to drain the fluid in your eardrum. In this procedure, a small tube may be placed in the eardrum to: Drain the fluid. Restore the air in the middle ear space. A procedure to insert a balloon device through the nose to inflate the opening of the eustachian tube (balloon dilation). Follow these instructions at home: Lifestyle Do not do any of the following until your health care provider approves: Travel to high altitudes. Fly in airplanes. Work in a pressurized cabin or room. Scuba dive. Do not use any products that contain nicotine or tobacco. These products include cigarettes, chewing tobacco, and vaping devices, such as e-cigarettes. If you need help quitting, ask your health care provider. Keep your ears dry. Wear fitted earplugs during showering and bathing. Dry your ears completely after. General instructions Take over-the-counter and prescription medicines only as told by your health care provider. Use techniques to help pop your ears as recommended by your health care provider. These may include: Chewing gum. Yawning. Frequent, forceful swallowing.   Closing your mouth, holding your nose closed, and gently blowing as if you are trying to blow air out of your nose. Keep all follow-up visits. This is important. Contact a  health care provider if: Your symptoms do not go away after treatment. Your symptoms come back after treatment. You are unable to pop your ears. You have: A fever. Pain in your ear. Pain in your head or neck. Fluid draining from your ear. Your hearing suddenly changes. You become very dizzy. You lose your balance. Get help right away if: You have a sudden, severe increase in any of your symptoms. Summary Eustachian tube dysfunction refers to a condition in which a blockage develops in the eustachian tube. It can be caused by ear infections, allergies, inhaled irritants, or abnormal growths in the nose or throat. Symptoms may include ear pain or fullness, hearing loss, or ringing in the ears. Mild cases are treated with techniques to unblock the ears, such as yawning or chewing gum. More severe cases are treated with medicines or procedures. This information is not intended to replace advice given to you by your health care provider. Make sure you discuss any questions you have with your health care provider. Document Revised: 09/01/2020 Document Reviewed: 09/01/2020 Elsevier Patient Education  [place]. Vertigo Vertigo is the feeling that you or the things around you are moving when they are not. This feeling can come and go at any time. Vertigo often goes away on its own. This condition can be dangerous if it happens when you are doing activities like driving or working with machines. Your doctor will do tests to find the cause of your vertigo. These tests will also help your doctor decide on the best treatment for you. Follow these instructions at home: Eating and drinking     Drink enough fluid to keep your pee (urine) pale yellow. Do not drink alcohol. Activity Return to your normal activities when your doctor says that it is safe. In the morning, first sit up on the side of the bed. When you feel okay, stand slowly while you hold onto something until you know that  your balance is fine. Move slowly. Avoid sudden body or head movements or certain positions, as told by your doctor. Use a cane if you have trouble standing or walking. Sit down right away if you feel dizzy. Avoid doing any tasks or activities that can cause danger to you or others if you get dizzy. Avoid bending down if you feel dizzy. Place items in your home so that they are easy for you to reach without bending or leaning over. Do not drive or use machinery if you feel dizzy. General instructions Take over-the-counter and prescription medicines only as told by your doctor. Keep all follow-up visits. Contact a doctor if: Your medicine does not help your vertigo. Your problems get worse or you have new symptoms. You have a fever. You feel like you may vomit (nauseous), or this feeling gets worse. You start to vomit. Your family or friends see changes in how you act. You lose feeling (have numbness) in part of your body. You feel prickling and tingling in a part of your body. Get help right away if: You are always dizzy. You faint. You get very bad headaches. You get a stiff neck. Bright light starts to bother you. You have trouble moving or talking. You feel weak in your hands, arms, or legs. You have changes in your hearing or in how you see (  vision). These symptoms may be an emergency. Get help right away. Call your local emergency services (911 in the [place] Do not wait to see if the symptoms will go away. Do not drive yourself to the hospital. Summary Vertigo is the feeling that you or the things around you are moving when they are not. Your doctor will do tests to find the cause of your vertigo. You may be told to avoid some tasks, positions, or movements. Contact a doctor if your medicine is not helping, or if you have a fever, new symptoms, or a change in how you act. Get help right away if you get very bad headaches, or if you have changes in how you speak, hear, or  see. This information is not intended to replace advice given to you by your health care provider. Make sure you discuss any questions you have with your health care provider. Document Revised: 05/21/2020 Document Reviewed: 05/21/2020 Elsevier Patient Education  [place].

## 2022-09-10 NOTE — Progress Notes (Signed)
Subjective:    Patient ID: Misty Clark, female    DOB: 1950-10-27, 72 y.o.   MRN: [id number]   Chief Complaint: Dizziness and Left ear pain   Dizziness Pertinent negatives include no chills or fatigue.   Patient come sin today c/o dizzineess and ear pain. - dizziness-started 3 days ago during the night. Has been intermittent every since. Last about 1-5 minutes  then goes away. Happens sitting standing and laying. She has been taking meclizine which seems to help. - left ear pain- started Tuesday. Is better today. No drainage. No hearing loss.   Patient Active Problem List   Diagnosis Date Noted   Asthma-COPD overlap syndrome 05/14/2020   Seasonal and perennial allergic rhinitis 05/14/2020   Mixed hyperlipidemia 12/26/2017   Seasonal allergic rhinitis due to pollen 11/18/2017   Generalized anxiety disorder 06/03/2008   Essential hypertension, benign 06/03/2008   GERD 06/03/2008   Irritable bowel syndrome 06/03/2008   Nonspecific abnormal results of cardiovascular function study 06/03/2008       Review of Systems  Constitutional:  Negative for chills and fatigue.  HENT:  Positive for ear pain. Negative for ear discharge.   Neurological:  Positive for dizziness.       Objective:   Physical Exam Vitals and nursing note reviewed.  Constitutional:      General: She is not in acute distress.    Appearance: Normal appearance. She is well-developed.  HENT:     Head: Normocephalic.     Right Ear: Tympanic membrane normal.     Left Ear: Tympanic membrane normal.     Nose: Nose normal.     Mouth/Throat:     Mouth: Mucous membranes are moist.  Eyes:     Pupils: Pupils are equal, round, and reactive to light.  Neck:     Vascular: No carotid bruit or JVD.  Cardiovascular:     Rate and Rhythm: Normal rate and regular rhythm.     Heart sounds: Normal heart sounds.  Pulmonary:     Effort: Pulmonary effort is normal. No respiratory distress.     Breath sounds: Normal  breath sounds. No wheezing or rales.  Chest:     Chest wall: No tenderness.  Abdominal:     General: Bowel sounds are normal. There is no distension or abdominal bruit.     Palpations: Abdomen is soft. There is no hepatomegaly, splenomegaly, mass or pulsatile mass.     Tenderness: There is no abdominal tenderness.  Musculoskeletal:        General: Normal range of motion.     Cervical back: Normal range of motion and neck supple.  Lymphadenopathy:     Cervical: No cervical adenopathy.  Skin:    General: Skin is warm and dry.  Neurological:     General: No focal deficit present.     Mental Status: She is alert and oriented to person, place, and time.     Cranial Nerves: No cranial nerve deficit.     Sensory: No sensory deficit.     Deep Tendon Reflexes: Reflexes are normal and symmetric.  Psychiatric:        Behavior: Behavior normal.        Thought Content: Thought content normal.        Judgment: Judgment normal.     BP (!) 150/81   Pulse 97   Temp 97.7 F (36.5 C) (Temporal)   Resp 20   Ht '5\' 5"'$  (1.651 m)   Wt 159 lb (  72.1 kg)   SpO2 100%   BMI 26.46 kg/m        Assessment & Plan:   Misty Clark in today with chief complaint of Dizziness and Left ear pain   1. Dysfunction of both eustachian tubes Daily flonase Force fluids Continue allergy shots  2. Vertigo Force fluids Fall precautions Meclizine as needed  Meds ordered this encounter  Medications   fluticasone (FLONASE) 50 MCG/ACT nasal spray    Sig: Place 2 sprays into both nostrils daily.    Dispense:  16 g    Refill:  6    Order Specific Question:   Supervising Provider    Answer:   Caryl Pina A [id number]        The above assessment and management plan was discussed with the patient. The patient verbalized understanding of and has agreed to the management plan. Patient is aware to call the clinic if symptoms persist or worsen. Patient is aware when to return to the clinic for a  follow-up visit. Patient educated on when it is appropriate to go to the emergency department.   Mary-Margaret Hassell Done, FNP

## 2022-09-13 ENCOUNTER — Telehealth: Payer: Self-pay | Admitting: Family Medicine

## 2022-09-13 MED ORDER — PREDNISONE 20 MG PO TABS: 40.0000 mg | ORAL_TABLET | Freq: Every day | ORAL | 0 refills | Status: AC

## 2022-09-13 NOTE — Telephone Encounter (Signed)
Patient has to be seen so some one can look in her ear.

## 2022-09-13 NOTE — Telephone Encounter (Signed)
Pt advised of provider feedback and pt states she was just seen Friday and was told to call back if she wasn't any better and prednisone would be sent in "and this is ridiculous since I'm doing what I was told" Please advise

## 2022-09-13 NOTE — Telephone Encounter (Signed)
Please let patient know that prednisone was sent to pharmacy

## 2022-09-13 NOTE — Telephone Encounter (Signed)
Prednisone sent to pharmacy Meds ordered this encounter  Medications   predniSONE (DELTASONE) 20 MG tablet    Sig: Take 2 tablets (40 mg total) by mouth daily with breakfast for 5 days. 2 po daily for 5 days    Dispense:  10 tablet    Refill:  0    Order Specific Question:   Supervising Provider    Answer:   Worthy Rancher [id number]   Villanueva, FNP

## 2022-09-13 NOTE — Telephone Encounter (Signed)
Patient notified and verbalized understanding. 

## 2022-09-13 NOTE — Addendum Note (Signed)
Addended by: Chevis Pretty on: 09/13/2022 01:44 PM   Modules accepted: Orders

## 2022-09-13 NOTE — Telephone Encounter (Signed)
Patient still has fluid in ear and dizziness. Would like to have something called in to [facility] in [place]. She had an appt 3/8 with MMM

## 2022-09-17 ENCOUNTER — Ambulatory Visit (INDEPENDENT_AMBULATORY_CARE_PROVIDER_SITE_OTHER): Payer: Medicare HMO

## 2022-09-17 DIAGNOSIS — J309 Allergic rhinitis, unspecified: Secondary | ICD-10-CM

## 2022-09-24 ENCOUNTER — Ambulatory Visit (INDEPENDENT_AMBULATORY_CARE_PROVIDER_SITE_OTHER): Payer: Medicare HMO

## 2022-09-24 DIAGNOSIS — J309 Allergic rhinitis, unspecified: Secondary | ICD-10-CM | POA: Diagnosis not present

## 2022-09-28 ENCOUNTER — Ambulatory Visit (INDEPENDENT_AMBULATORY_CARE_PROVIDER_SITE_OTHER): Payer: Medicare HMO

## 2022-09-28 DIAGNOSIS — J309 Allergic rhinitis, unspecified: Secondary | ICD-10-CM

## 2022-09-29 ENCOUNTER — Ambulatory Visit (INDEPENDENT_AMBULATORY_CARE_PROVIDER_SITE_OTHER): Payer: Medicare HMO

## 2022-09-29 VITALS — Ht 65.0 in | Wt 159.0 lb

## 2022-09-29 DIAGNOSIS — Z780 Asymptomatic menopausal state: Secondary | ICD-10-CM

## 2022-09-29 DIAGNOSIS — Z0000 Encounter for general adult medical examination without abnormal findings: Secondary | ICD-10-CM | POA: Diagnosis not present

## 2022-09-29 DIAGNOSIS — Z1231 Encounter for screening mammogram for malignant neoplasm of breast: Secondary | ICD-10-CM

## 2022-09-29 NOTE — Progress Notes (Signed)
Subjective:   Misty Clark is a 71 y.o. female who presents for Medicare Annual (Subsequent) preventive examination. I connected with  Misty Clark on 09/29/22 by a audio enabled telemedicine application and verified that I am speaking with the correct person using two identifiers.  Patient Location: Home  Provider Location: Home Office  I discussed the limitations of evaluation and management by telemedicine. The patient expressed understanding and agreed to proceed.  Review of Systems     Cardiac Risk Factors include: advanced age (>55men, >19 women);hypertension;dyslipidemia     Objective:    Today's Vitals   09/29/22 1025  Weight: 159 lb (72.1 kg)  Height: 5\' 5"  (1.651 m)   Body mass index is 26.46 kg/m.     09/29/2022   10:30 AM 09/23/2021   10:39 AM 07/28/2020   10:43 AM  Advanced Directives  Does Patient Have a Medical Advance Directive? No No No  Would patient like information on creating a medical advance directive? No - Patient declined No - Patient declined No - Patient declined    Current Medications (verified) Outpatient Encounter Medications as of 09/29/2022  Medication Sig   albuterol (VENTOLIN HFA) 108 (90 Base) MCG/ACT inhaler INHALE 2 PUFFS BY MOUTH EVERY 6 HOURS AS NEEDED FOR WHEEZING AND FOR SHORTNESS OF BREATH (NEEDS TO BE SEEN BEFORE NEXT REFILL)   ALPRAZolam (XANAX) 0.5 MG tablet Take 1 tablet (0.5 mg total) by mouth daily as needed for anxiety. Place on file. Doesn't need yet   amLODipine (NORVASC) 2.5 MG tablet Take 1 tablet (2.5 mg total) by mouth daily. For high blood pressure   atorvastatin (LIPITOR) 40 MG tablet Take 1 tablet (40 mg total) by mouth daily.   Budeson-Glycopyrrol-Formoterol (BREZTRI AEROSPHERE) 160-9-4.8 MCG/ACT AERO Inhale 2 puffs into the lungs in the morning and at bedtime.   citalopram (CELEXA) 20 MG tablet TAKE 1 TABLET BY MOUTH ONCE DAILY .   EPINEPHrine 0.3 mg/0.3 mL IJ SOAJ injection Inject 0.3 mg into the muscle as  needed for anaphylaxis. As needed for life-threatening allergic reactions   fluticasone (FLONASE) 50 MCG/ACT nasal spray Place 2 sprays into both nostrils daily.   hydrochlorothiazide (HYDRODIURIL) 25 MG tablet Take 0.5-1 tablets (12.5-25 mg total) by mouth daily as needed (leg swelling).   ipratropium (ATROVENT) 0.06 % nasal spray Place 1 spray into both nostrils 3 (three) times daily. every 8 hours as needed to help with drying out your nose   levocetirizine (XYZAL) 5 MG tablet Take 1 tablet (5 mg total) by mouth every evening.   meclizine (ANTIVERT) 12.5 MG tablet Take 1 tablet (12.5 mg total) by mouth 3 (three) times daily as needed for dizziness.   montelukast (SINGULAIR) 10 MG tablet Take 1 tablet (10 mg total) by mouth at bedtime.   No facility-administered encounter medications on file as of 09/29/2022.    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   Hypertension    IBS (irritable bowel syndrome)    Mixed   Multiple allergies    Recurrent upper respiratory infection (URI)    Past Surgical History:  Procedure Laterality Date   ABDOMINAL HYSTERECTOMY     CHOLECYSTECTOMY     SHOULDER SURGERY Left 2012   SINOSCOPY     SINUS SURGERY WITH INSTATRAK     Family History  Problem Relation Age of Onset  Heart failure Maternal Grandmother        CHF   Breast cancer Sister    Heart attack Paternal Grandfather        MI   Coronary artery disease Paternal Grandfather        At a premature age   Coronary artery disease Other    Colon cancer Other    Prostate cancer Other    Ovarian cancer Other    Asthma Mother    Thyroid disease Mother    Heart disease Father    Hypertension Daughter    Allergic rhinitis Neg Hx    Angioedema Neg Hx    Atopy Neg Hx    Eczema Neg Hx    Immunodeficiency Neg Hx    Urticaria Neg Hx    Social History    Socioeconomic History   Marital status: Widowed    Spouse name: Not on file   Number of children: 4   Years of education: Not on file   Highest education level: Some college, no degree  Occupational History   Occupation: Retired Nursing    Comment: Scientist, research (physical sciences) HH  Tobacco Use   Smoking status: Former    Packs/day: 0.50    Years: 3.00    Additional pack years: 0.00    Total pack years: 1.50    Types: Cigarettes   Smokeless tobacco: Never   Tobacco comments:    Quit many years ago.  Vaping Use   Vaping Use: Never used  Substance and Sexual Activity   Alcohol use: No   Drug use: Never   Sexual activity: Not on file  Other Topics Concern   Not on file  Social History Narrative   Retired Materials engineer. 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 Lives alone. Enjoys crafting, puzzles, reading, and gardening.    Social Determinants of Health   Financial Resource Strain: Low Risk  (09/29/2022)   Overall Financial Resource Strain (CARDIA)    Difficulty of Paying Living Expenses: Not hard at all  Food Insecurity: No Food Insecurity (09/29/2022)   Hunger Vital Sign    Worried About Running Out of Food in the Last Year: Never true    Ran Out of Food in the Last Year: Never true  Transportation Needs: No Transportation Needs (09/29/2022)   PRAPARE - Hydrologist (Medical): No    Lack of Transportation (Non-Medical): No  Physical Activity: Sufficiently Active (09/29/2022)   Exercise Vital Sign    Days of Exercise per Week: 5 days    Minutes of Exercise per Session: 30 min  Stress: No Stress Concern Present (09/29/2022)   [place]    Feeling of Stress : Not at all  Social Connections: Moderately Integrated (09/29/2022)   Social Connection and Isolation Panel [NHANES]    Frequency of Communication with Friends and Family: More than three times a week    Frequency of Social Gatherings  with Friends and Family: More than three times a week    Attends Religious Services: More than 4 times per year    Active Member of [employer] or Organizations: Yes    Attends Archivist Meetings: More than 4 times per year    Marital Status: Widowed    Tobacco Counseling Counseling given: Not Answered Tobacco comments: Quit many years ago.   Clinical Intake:  Pre-visit preparation completed: Yes  Pain : No/denies pain     Nutritional Risks: None Diabetes: No  How often do you need to have someone help you when you read instructions, pamphlets, or other written materials from your doctor or pharmacy?: 1 - Never  Diabetic?no   Interpreter Needed?: No  Information entered by :: Jadene Pierini, LPN   Activities of Daily Living    09/29/2022   10:30 AM  In your present state of health, do you have any difficulty performing the following activities:  Hearing? 0  Vision? 0  Difficulty concentrating or making decisions? 0  Walking or climbing stairs? 0  Dressing or bathing? 0  Doing errands, shopping? 0  Preparing Food and eating ? N  Using the Toilet? N  In the past six months, have you accidently leaked urine? N  Do you have problems with loss of bowel control? N  Managing your Medications? N  Managing your Finances? N  Housekeeping or managing your Housekeeping? N    Patient Care Team: Janora Norlander, DO as PCP - General (Family Medicine)  Indicate any recent Medical Services you may have received from other than [facility] providers in the past year (date may be approximate).     Assessment:   This is a routine wellness examination for Misty Clark.  Hearing/Vision screen Vision Screening - Comments:: Wears rx glasses - up to date with routine eye exams with  Dr.Lee   Dietary issues and exercise activities discussed: Current Exercise Habits: Home exercise routine, Type of exercise: walking, Time (Minutes): 30, Frequency (Times/Week): 5, Weekly Exercise  (Minutes/Week): 150, Intensity: Mild, Exercise limited by: None identified   Goals Addressed             This Visit's Progress    Exercise 3x per week (30 min per time)   On track    Increase exercise, work out in yard and swim.       Depression Screen    09/29/2022   10:28 AM 09/10/2022   10:10 AM 08/23/2022   10:13 AM 06/15/2022   10:23 AM 12/11/2021   11:52 AM 09/23/2021   10:32 AM 03/03/2021    3:35 PM  PHQ 2/9 Scores  PHQ - 2 Score 0 0 0 0 0 1 0  PHQ- 9 Score 0 0 0 0       Fall Risk    09/29/2022   10:26 AM 09/10/2022   10:10 AM 08/23/2022   10:13 AM 06/15/2022   10:23 AM 12/11/2021   11:52 AM  Fall Risk   Falls in the past year? 0 0 0 0 0  Number falls in past yr: 0   0   Injury with Fall? 0   0   Risk for fall due to : No Fall Risks   No Fall Risks   Follow up Falls prevention discussed   Education provided     FALL RISK PREVENTION PERTAINING TO THE HOME:  Any stairs in or around the home? Yes  If so, are there any without handrails? No  Home free of loose throw rugs in walkways, pet beds, electrical cords, etc? Yes  Adequate lighting in your home to reduce risk of falls? Yes   ASSISTIVE DEVICES UTILIZED TO PREVENT FALLS:  Life alert? No  Use of a cane, walker or w/c? No  Grab bars in the bathroom? No  Shower chair or bench in shower? No  Elevated toilet seat or a handicapped toilet? No          09/29/2022   10:31 AM 07/28/2020   10:45 AM  6CIT Screen  What Year? 0 points 0 points  What month? 0 points 0 points  What time? 0 points 0 points  Count back from 20 0 points 0 points  Months in reverse 0 points 0 points  Repeat phrase 0 points 0 points  Total Score 0 points 0 points    Immunizations Immunization History  Administered Date(s) Administered   Influenza,inj,Quad PF,6+ Mos 03/29/2018   Janssen (J&J) SARS-COV-2 Vaccination 03/25/2020   Pneumococcal Conjugate-13 06/02/2015   Pneumococcal Polysaccharide-23 07/28/2016   Zoster Recombinat  (Shingrix) 12/11/2021    TDAP status: Due, Education has been provided regarding the importance of this vaccine. Advised may receive this vaccine at local pharmacy or Health Dept. Aware to provide a copy of the vaccination record if obtained from local pharmacy or Health Dept. Verbalized acceptance and understanding.  Flu Vaccine status: Up to date  Pneumococcal vaccine status: Up to date  Covid-19 vaccine status: Completed vaccines  Qualifies for Shingles Vaccine? Yes   Zostavax completed No   Shingrix Completed?: No.    Education has been provided regarding the importance of this vaccine. Patient has been advised to call insurance company to determine out of pocket expense if they have not yet received this vaccine. Advised may also receive vaccine at local pharmacy or Health Dept. Verbalized acceptance and understanding.  Screening Tests Health Maintenance  Topic Date Due   MAMMOGRAM  02/16/2022   Fecal DNA (Cologuard)  09/29/2022 (Originally 05/05/1995)   INFLUENZA VACCINE  10/03/2022 (Originally 02/02/2022)   Zoster Vaccines- Shingrix (2 of 2) 11/21/2022 (Originally 02/05/2022)   DEXA SCAN  06/30/2023 (Originally 10/02/2021)   Medicare Annual Wellness (AWV)  09/29/2023   Pneumonia Vaccine 55+ Years old  Completed   Hepatitis C Screening  Completed   HPV VACCINES  Aged Out   DTaP/Tdap/Td  Discontinued   COVID-19 Vaccine  Discontinued    Health Maintenance  Health Maintenance Due  Topic Date Due   MAMMOGRAM  02/16/2022    Colorectal cancer screening: Referral to GI placed requesting to do cologuard home test . Pt aware the office will call re: appt.  Mammogram status: Ordered 09/29/2022  patient goes to [facility] . Pt provided with contact info and advised to call to schedule appt.   Bone Density status: Ordered 09/29/2022. Pt provided with contact info and advised to call to schedule appt.  Lung Cancer Screening: (Low Dose CT Chest recommended if Age 68-80 years, 30  pack-year currently smoking OR have quit w/in 15years.) does not qualify.   Lung Cancer Screening Referral: n/a  Additional Screening:  Hepatitis C Screening: does not qualify; Completed 10/03/2019  Vision Screening: Recommended annual ophthalmology exams for early detection of glaucoma and other disorders of the eye. Is the patient up to date with their annual eye exam?  Yes  Who is the provider or what is the name of the office in which the patient attends annual eye exams? Dr.Lee  If pt is not established with a provider, would they like to be referred to a provider to establish care? No .   Dental Screening: Recommended annual dental exams for proper oral hygiene  Community Resource Referral / Chronic Care Management: CRR required this visit?  No   CCM required this visit?  No      Plan:     I have personally reviewed and noted the following in the patient's chart:   Medical and social history Use of alcohol, tobacco or illicit drugs  Current medications and supplements including opioid  prescriptions. Patient is not currently taking opioid prescriptions. Functional ability and status Nutritional status Physical activity Advanced directives List of other physicians Hospitalizations, surgeries, and ER visits in previous 12 months Vitals Screenings to include cognitive, depression, and falls Referrals and appointments  In addition, I have reviewed and discussed with patient certain preventive protocols, quality metrics, and best practice recommendations. A written personalized care plan for preventive services as well as general preventive health recommendations were provided to patient.     Daphane Shepherd, LPN   [id number]   Nurse Notes: Due Tdap Vaccine /2nd dose Shingrix

## 2022-09-29 NOTE — Patient Instructions (Signed)
Misty Clark , Thank you for taking time to come for your Medicare Wellness Visit. I appreciate your ongoing commitment to your health goals. Please review the following plan we discussed and let me know if I can assist you in the future.   These are the goals we discussed:  Goals      Exercise 3x per week (30 min per time)     Increase exercise, work out in yard and swim.     Patient Stated     07/28/2020 AWV Goal: Keep All Scheduled Appointments  Over the next year, patient will attend all scheduled appointments with their PCP and any specialists that they see.         This is a list of the screening recommended for you and due dates:  Health Maintenance  Topic Date Due   Mammogram  02/16/2022   Cologuard (Stool DNA test)  09/29/2022*   Flu Shot  10/03/2022*   Zoster (Shingles) Vaccine (2 of 2) 11/21/2022*   DEXA scan (bone density measurement)  06/30/2023*   Medicare Annual Wellness Visit  09/29/2023   Pneumonia Vaccine  Completed   Hepatitis C Screening: USPSTF Recommendation to screen - Ages 18-79 yo.  Completed   HPV Vaccine  Aged Out   DTaP/Tdap/Td vaccine  Discontinued   COVID-19 Vaccine  Discontinued  *Topic was postponed. The date shown is not the original due date.    Advanced directives: Advance directive discussed with you today. I have provided a copy for you to complete at home and have notarized. Once this is complete please bring a copy in to our office so we can scan it into your chart.   Conditions/risks identified: Aim for 30 minutes of exercise or brisk walking, 6-8 glasses of water, and 5 servings of fruits and vegetables each day.   Next appointment: Follow up in one year for your annual wellness visit    Preventive Care 65 Years and Older, Female Preventive care refers to lifestyle choices and visits with your health care provider that can promote health and wellness. What does preventive care include? A yearly physical exam. This is also called an  annual well check. Dental exams once or twice a year. Routine eye exams. Ask your health care provider how often you should have your eyes checked. Personal lifestyle choices, including: Daily care of your teeth and gums. Regular physical activity. Eating a healthy diet. Avoiding tobacco and drug use. Limiting alcohol use. Practicing safe sex. Taking low-dose aspirin every day. Taking vitamin and mineral supplements as recommended by your health care provider. What happens during an annual well check? The services and screenings done by your health care provider during your annual well check will depend on your age, overall health, lifestyle risk factors, and family history of disease. Counseling  Your health care provider may ask you questions about your: Alcohol use. Tobacco use. Drug use. Emotional well-being. Home and relationship well-being. Sexual activity. Eating habits. History of falls. Memory and ability to understand (cognition). Work and work Statistician. Reproductive health. Screening  You may have the following tests or measurements: Height, weight, and BMI. Blood pressure. Lipid and cholesterol levels. These may be checked every 5 years, or more frequently if you are over 73 years old. Skin check. Lung cancer screening. You may have this screening every year starting at age 73 if you have a 30-pack-year history of smoking and currently smoke or have quit within the past 15 years. Fecal occult blood test (FOBT) of  the stool. You may have this test every year starting at age 73. Flexible sigmoidoscopy or colonoscopy. You may have a sigmoidoscopy every 5 years or a colonoscopy every 10 years starting at age 73. Hepatitis C blood test. Hepatitis B blood test. Sexually transmitted disease (STD) testing. Diabetes screening. This is done by checking your blood sugar (glucose) after you have not eaten for a while (fasting). You may have this done every 1-3 years. Bone  density scan. This is done to screen for osteoporosis. You may have this done starting at age 73. Mammogram. This may be done every 1-2 years. Talk to your health care provider about how often you should have regular mammograms. Talk with your health care provider about your test results, treatment options, and if necessary, the need for more tests. Vaccines  Your health care provider may recommend certain vaccines, such as: Influenza vaccine. This is recommended every year. Tetanus, diphtheria, and acellular pertussis (Tdap, Td) vaccine. You may need a Td booster every 10 years. Zoster vaccine. You may need this after age 73. Pneumococcal 13-valent conjugate (PCV13) vaccine. One dose is recommended after age 73. Pneumococcal polysaccharide (PPSV23) vaccine. One dose is recommended after age 73. Talk to your health care provider about which screenings and vaccines you need and how often you need them. This information is not intended to replace advice given to you by your health care provider. Make sure you discuss any questions you have with your health care provider. Document Released: 07/18/2015 Document Revised: 03/10/2016 Document Reviewed: 04/22/2015 Elsevier Interactive Patient Education  2017 [place] Prevention in the Home Falls can cause injuries. They can happen to people of all ages. There are many things you can do to make your home safe and to help prevent falls. What can I do on the outside of my home? Regularly fix the edges of walkways and driveways and fix any cracks. Remove anything that might make you trip as you walk through a door, such as a raised step or threshold. Trim any bushes or trees on the path to your home. Use bright outdoor lighting. Clear any walking paths of anything that might make someone trip, such as rocks or tools. Regularly check to see if handrails are loose or broken. Make sure that both sides of any steps have handrails. Any raised  decks and porches should have guardrails on the edges. Have any leaves, snow, or ice cleared regularly. Use sand or salt on walking paths during winter. Clean up any spills in your garage right away. This includes oil or grease spills. What can I do in the bathroom? Use night lights. Install grab bars by the toilet and in the tub and shower. Do not use towel bars as grab bars. Use non-skid mats or decals in the tub or shower. If you need to sit down in the shower, use a plastic, non-slip stool. Keep the floor dry. Clean up any water that spills on the floor as soon as it happens. Remove soap buildup in the tub or shower regularly. Attach bath mats securely with double-sided non-slip rug tape. Do not have throw rugs and other things on the floor that can make you trip. What can I do in the bedroom? Use night lights. Make sure that you have a light by your bed that is easy to reach. Do not use any sheets or blankets that are too big for your bed. They should not hang down onto the floor. Have a firm chair  that has side arms. You can use this for support while you get dressed. Do not have throw rugs and other things on the floor that can make you trip. What can I do in the kitchen? Clean up any spills right away. Avoid walking on wet floors. Keep items that you use a lot in easy-to-reach places. If you need to reach something above you, use a strong step stool that has a grab bar. Keep electrical cords out of the way. Do not use floor polish or wax that makes floors slippery. If you must use wax, use non-skid floor wax. Do not have throw rugs and other things on the floor that can make you trip. What can I do with my stairs? Do not leave any items on the stairs. Make sure that there are handrails on both sides of the stairs and use them. Fix handrails that are broken or loose. Make sure that handrails are as long as the stairways. Check any carpeting to make sure that it is firmly attached  to the stairs. Fix any carpet that is loose or worn. Avoid having throw rugs at the top or bottom of the stairs. If you do have throw rugs, attach them to the floor with carpet tape. Make sure that you have a light switch at the top of the stairs and the bottom of the stairs. If you do not have them, ask someone to add them for you. What else can I do to help prevent falls? Wear shoes that: Do not have high heels. Have rubber bottoms. Are comfortable and fit you well. Are closed at the toe. Do not wear sandals. If you use a stepladder: Make sure that it is fully opened. Do not climb a closed stepladder. Make sure that both sides of the stepladder are locked into place. Ask someone to hold it for you, if possible. Clearly mark and make sure that you can see: Any grab bars or handrails. First and last steps. Where the edge of each step is. Use tools that help you move around (mobility aids) if they are needed. These include: Canes. Walkers. Scooters. Crutches. Turn on the lights when you go into a dark area. Replace any light bulbs as soon as they burn out. Set up your furniture so you have a clear path. Avoid moving your furniture around. If any of your floors are uneven, fix them. If there are any pets around you, be aware of where they are. Review your medicines with your doctor. Some medicines can make you feel dizzy. This can increase your chance of falling. Ask your doctor what other things that you can do to help prevent falls. This information is not intended to replace advice given to you by your health care provider. Make sure you discuss any questions you have with your health care provider. Document Released: 04/17/2009 Document Revised: 11/27/2015 Document Reviewed: 07/26/2014 Elsevier Interactive Patient Education  2017 [employer].

## 2022-10-15 ENCOUNTER — Ambulatory Visit (INDEPENDENT_AMBULATORY_CARE_PROVIDER_SITE_OTHER): Payer: Medicare HMO

## 2022-10-15 DIAGNOSIS — J309 Allergic rhinitis, unspecified: Secondary | ICD-10-CM

## 2022-10-18 ENCOUNTER — Telehealth: Payer: Self-pay | Admitting: *Deleted

## 2022-10-18 NOTE — Telephone Encounter (Signed)
L/m for patient regarding approval for Tezspire pen and number to reach out to order same. Instructed on delivery, storage and dosing and any questions to reach out to me

## 2022-10-20 ENCOUNTER — Ambulatory Visit: Payer: Medicare HMO

## 2022-10-20 DIAGNOSIS — J4550 Severe persistent asthma, uncomplicated: Secondary | ICD-10-CM

## 2022-10-20 NOTE — Progress Notes (Signed)
Immunotherapy   Patient Details  Name: Misty Clark MRN: [id number] Date of Birth: Jun 01, 1951  10/20/2022  [name] here to self administer. Patient was shown how to self administer and demonstrated correct administration. Patient will continue to self administer at home. Patient waited 30 minutes with no problems.  [phone] [id number] A Exp-07/04/2024 Frequency: every 28 days Epi-Pen:Epi-Pen Available  Consent signed and patient instructions given.   [name] 10/20/2022, 10:53 AM

## 2022-10-22 ENCOUNTER — Ambulatory Visit (INDEPENDENT_AMBULATORY_CARE_PROVIDER_SITE_OTHER): Payer: Medicare HMO

## 2022-10-22 DIAGNOSIS — J309 Allergic rhinitis, unspecified: Secondary | ICD-10-CM | POA: Diagnosis not present

## 2022-10-28 ENCOUNTER — Ambulatory Visit (INDEPENDENT_AMBULATORY_CARE_PROVIDER_SITE_OTHER): Payer: Medicare HMO

## 2022-10-28 DIAGNOSIS — J309 Allergic rhinitis, unspecified: Secondary | ICD-10-CM

## 2022-11-03 ENCOUNTER — Ambulatory Visit (INDEPENDENT_AMBULATORY_CARE_PROVIDER_SITE_OTHER): Payer: Medicare HMO | Admitting: *Deleted

## 2022-11-03 DIAGNOSIS — J309 Allergic rhinitis, unspecified: Secondary | ICD-10-CM

## 2022-11-11 ENCOUNTER — Ambulatory Visit (INDEPENDENT_AMBULATORY_CARE_PROVIDER_SITE_OTHER): Payer: Medicare HMO | Admitting: Allergy & Immunology

## 2022-11-11 ENCOUNTER — Encounter: Payer: Self-pay | Admitting: Allergy & Immunology

## 2022-11-11 ENCOUNTER — Other Ambulatory Visit: Payer: Self-pay

## 2022-11-11 VITALS — BP 158/82 | HR 84 | Temp 98.1°F | Ht 63.39 in | Wt 160.8 lb

## 2022-11-11 DIAGNOSIS — K219 Gastro-esophageal reflux disease without esophagitis: Secondary | ICD-10-CM | POA: Diagnosis not present

## 2022-11-11 DIAGNOSIS — J4550 Severe persistent asthma, uncomplicated: Secondary | ICD-10-CM

## 2022-11-11 DIAGNOSIS — E663 Overweight: Secondary | ICD-10-CM | POA: Diagnosis not present

## 2022-11-11 DIAGNOSIS — J309 Allergic rhinitis, unspecified: Secondary | ICD-10-CM

## 2022-11-11 DIAGNOSIS — Z6828 Body mass index (BMI) 28.0-28.9, adult: Secondary | ICD-10-CM | POA: Diagnosis not present

## 2022-11-11 DIAGNOSIS — M25532 Pain in left wrist: Secondary | ICD-10-CM | POA: Diagnosis not present

## 2022-11-11 DIAGNOSIS — S52502A Unspecified fracture of the lower end of left radius, initial encounter for closed fracture: Secondary | ICD-10-CM | POA: Diagnosis not present

## 2022-11-11 DIAGNOSIS — J302 Other seasonal allergic rhinitis: Secondary | ICD-10-CM

## 2022-11-11 DIAGNOSIS — R030 Elevated blood-pressure reading, without diagnosis of hypertension: Secondary | ICD-10-CM | POA: Diagnosis not present

## 2022-11-11 DIAGNOSIS — M25539 Pain in unspecified wrist: Secondary | ICD-10-CM | POA: Diagnosis not present

## 2022-11-11 MED ORDER — FAMOTIDINE 40 MG PO TABS: 40.0000 mg | ORAL_TABLET | Freq: Every day | ORAL | 1 refills | Status: DC

## 2022-11-11 NOTE — Patient Instructions (Addendum)
1. Chronic rhinitis (grasses, trees and indoor molds) - You are doing well with the allergy shots.  - You just started the [employer] and so we are approaching the top dose ([employer]).  - Continue with Zyrtec (cetirizine) 10mg  tablet once daily and Singulair (montelukast) 10mg  daily and Flonase (fluticasone) one spray per nostril daily  - Continue with nasal ipratropium every 8 hours as needed to help with drying out your nose.  - Continue with nasal saline rinses 1-2 times daily to remove allergens from the nasal cavities as well as help with mucous clearance (this is especially helpful to do before the nasal sprays are given)   2. Mild persistent asthma, uncomplicated - Lung testing looks stable. - We are not going to make any medication changes at this time.  - Daily controller medication(s): Breztri two puffs twice daily + Singulair (montelukast) 10mg  + Tezspire monthly - Prior to physical activity: albuterol 2 puffs 10-15 minutes before physical activity. - Rescue medications: albuterol 4 puffs every 4-6 hours as needed - Asthma control goals:  * Full participation in all desired activities (may need albuterol before activity) * Albuterol use two time or less a week on average (not counting use with activity) * Cough interfering with sleep two time or less a month * Oral steroids no more than once a year * No hospitalizations.  3. Return in about 4 months (around 03/14/2023). You can have the follow up appointment with Dr. Dellis Anes or a Nurse Practicioner (our Nurse Practitioners are excellent and always have Physician oversight!).    Please inform us of any Emergency Department visits, hospitalizations, or changes in symptoms. Call us before going to the ED for breathing or allergy symptoms since we might be able to fit you in for a sick visit. Feel free to contact us anytime with any questions, problems, or concerns.  It was a pleasure to see you again today!  Websites that have  reliable patient information: 1. American Academy of Asthma, Allergy, and Immunology: www.aaaai.org 2. Food Allergy Research and Education (FARE): foodallergy.org 3. Mothers of Asthmatics: http://www.asthmacommunitynetwork.org 4. American College of Allergy, Asthma, and Immunology: www.acaai.org   COVID-19 Vaccine Information can be found at: [email or web address] For questions related to vaccine distribution or appointments, please email [email or web address] or call [phone].   We realize that you might be concerned about having an allergic reaction to the COVID19 vaccines. To help with that concern, WE ARE OFFERING THE COVID19 VACCINES IN OUR OFFICE! Ask the front desk for dates!     "Like" [place] on Facebook and Instagram for our latest updates!      A healthy democracy works best when [employer] participate! Make sure you are registered to vote! If you have moved or changed any of your contact information, you will need to get this updated before voting!  In some cases, you MAY be able to register to vote online: [email or web address]

## 2022-11-11 NOTE — Progress Notes (Signed)
FOLLOW UP  Date of Service/Encounter:  11/11/22   Assessment:   Asthma COPD overlap syndrome - with remote history of smoking for 3 to 4 years back in high school (planning to add Tezspire to help with symptoms)  Consider retesting alpha-1 antitrypsin level at the next visit   Persistent restrictive lung disease - not responsive to prednisone   Seasonal and perennial allergic rhinitis (grasses, trees and indoor molds) - with new concern for pet exposure    Trouble affording medications - improved since [place] the [place]  Plan/Recommendations:   1. Chronic rhinitis (grasses, trees and indoor molds) - You are doing well with the allergy shots.  - You just started the [employer] and so we are approaching the top dose ([employer]).  - Continue with Zyrtec (cetirizine) 10mg  tablet once daily and Singulair (montelukast) 10mg  daily and Flonase (fluticasone) one spray per nostril daily  - Continue with nasal ipratropium every 8 hours as needed to help with drying out your nose.  - Continue with nasal saline rinses 1-2 times daily to remove allergens from the nasal cavities as well as help with mucous clearance (this is especially helpful to do before the nasal sprays are given)   2. Mild persistent asthma, uncomplicated - Lung testing looks stable. - We are not going to make any medication changes at this time.  - Daily controller medication(s): Breztri two puffs twice daily + Singulair (montelukast) 10mg  + Tezspire monthly - Prior to physical activity: albuterol 2 puffs 10-15 minutes before physical activity. - Rescue medications: albuterol 4 puffs every 4-6 hours as needed - Asthma control goals:  * Full participation in all desired activities (may need albuterol before activity) * Albuterol use two time or less a week on average (not counting use with activity) * Cough interfering with sleep two time or less a month * Oral steroids no more than once a year * No  hospitalizations.  3. Return in about 4 months (around 03/14/2023). You can have the follow up appointment with Dr. Dellis Clark or a Nurse Practicioner (our Nurse Practitioners are excellent and always have Physician oversight!).   Subjective:   Misty Clark is a 72 y.o. female presenting today for follow up of  Chief Complaint  Patient presents with   Asthma   Allergic Rhinitis    Follow-up    Misty Clark has a history of the following: Patient Active Problem List   Diagnosis Date Noted   Asthma-COPD overlap syndrome 05/14/2020   Seasonal and perennial allergic rhinitis 05/14/2020   Mixed hyperlipidemia 12/26/2017   Seasonal allergic rhinitis due to pollen 11/18/2017   Generalized anxiety disorder 06/03/2008   Essential hypertension, benign 06/03/2008   GERD 06/03/2008   Irritable bowel syndrome 06/03/2008   Nonspecific abnormal results of cardiovascular function study 06/03/2008    History obtained from: chart review and patient.  Misty Clark is a 72 y.o. female presenting for a follow up visit. She was last seen in February 2024. At that time, she was doing very well on allergy shots.  We continue with cetirizine 10 mg daily as well as Singulair and Flonase.  We also continue with nasal ipratropium every 8 hours as needed.  For her asthma, we continued her on Breztri 2 puffs twice daily as well as Singulair.  She was approved for drug assistance through [employer], which is also not covering her Tezspire.  We did diagnose her with acute sinusitis and started her on Augmentin 875 mg  twice daily for 7 days.  We did add Mucinex as well.  Since last visit, she has done very well.  Asthma/Respiratory Symptom History: She remains on the Tezspire. She feels that this is helping with her asthma.  She remains on the Breztri 2 puffs twice daily.  Symptomatically, she feels that everything is working very well.  However, her spirometry really has not improved.  We did review her history again  and she only smoked for a little while in high school, otherwise she denies any smoke exposure at all.  Her last alpha-1 antitrypsin level was around 140.  There is no family history of alpha 1 antitrypsin deficiency.  She is open to more lab work, but she wants to see if the injection this did not help first and would like to hold off until the next visit.  Allergic Rhinitis Symptom History: Symptoms have been bad with the pollen being so terrible.  She recently just started the [employer]. She had a slightly enlarged one once. She had some brusing once. She does occasionally use some Sudafed. She uses the levocetirizine and the montelukast at night. These aare only used during certain times.  She is feeling like the allergy shots are helping, although she has had a lot of drainage during this high pollen season.  Misty Clark is on allergen immunotherapy. She receives two injections. Immunotherapy script #1 contains molds. She currently receives 0.34mL of the GREEN vial (1/1,000). Immunotherapy script #2 contains trees and grasses. She currently receives 0.32mL of the GREEN vial (1/1,000). She started shots November of 2023 and not yet reached maintenance.   GERD Symptom History: She has not been on a GERD medication at all.  She is open to trying a reflux medication.  She does report that she gets reflux if she is eating late at night or eating triggering foods for dinner, including tacos and other [place] food.  If she is going to eat a GERD inducing food, she tends to do it at lunchtime.  Of note, she has had a DEXA scan that has been normal.  Otherwise, there have been no changes to her past medical history, surgical history, family history, or social history.    Review of Systems  Constitutional: Negative.  Negative for chills, fever, malaise/fatigue and weight loss.  HENT:  Positive for congestion. Negative for ear discharge, ear pain and sinus pain.        Positive for postnasal drip.  Positive  for throat clearing.  Eyes:  Negative for pain, discharge and redness.  Respiratory:  Positive for cough. Negative for sputum production, shortness of breath and wheezing.   Cardiovascular: Negative.  Negative for chest pain and palpitations.  Gastrointestinal:  Negative for abdominal pain, constipation, diarrhea, heartburn, nausea and vomiting.  Skin: Negative.  Negative for itching and rash.  Neurological:  Negative for dizziness and headaches.  Endo/Heme/Allergies:  Positive for environmental allergies. Does not bruise/bleed easily.       Objective:   Blood pressure (!) 158/82, pulse 84, temperature 98.1 F (36.7 C), temperature source Temporal, height 5' 3.39" (1.61 m), weight 160 lb 12.8 oz (72.9 kg), SpO2 97 %. Body mass index is 28.14 kg/m.    Physical Exam Vitals reviewed.  Constitutional:      Appearance: She is well-developed.     Comments: Speaking in full sentences.  Very talkative.  HENT:     Head: Normocephalic and atraumatic.     Right Ear: Tympanic membrane, ear canal and external ear  normal. No drainage, swelling or tenderness. Tympanic membrane is not injected, scarred, erythematous, retracted or bulging.     Left Ear: Tympanic membrane, ear canal and external ear normal. No drainage, swelling or tenderness. Tympanic membrane is not injected, scarred, erythematous, retracted or bulging.     Nose: No nasal deformity, septal deviation, mucosal edema or rhinorrhea.     Right Turbinates: Enlarged, swollen and pale.     Left Turbinates: Enlarged, swollen and pale.     Right Sinus: No maxillary sinus tenderness or frontal sinus tenderness.     Left Sinus: No maxillary sinus tenderness or frontal sinus tenderness.     Mouth/Throat:     Mouth: Mucous membranes are not pale and not dry.     Pharynx: Uvula midline.  Eyes:     General: Lids are normal.        Right eye: No discharge.        Left eye: No discharge.     Conjunctiva/sclera: Conjunctivae normal.      Right eye: Right conjunctiva is not injected. No chemosis.    Left eye: Left conjunctiva is not injected. No chemosis.    Pupils: Pupils are equal, round, and reactive to light.  Cardiovascular:     Rate and Rhythm: Normal rate and regular rhythm.     Heart sounds: Normal heart sounds.  Pulmonary:     Effort: Pulmonary effort is normal. No tachypnea, accessory muscle usage or respiratory distress.     Breath sounds: Normal breath sounds. No wheezing, rhonchi or rales.     Comments: Moving air well in all lung fields. No increased work of breathing noted.  Chest:     Chest wall: No tenderness.  Abdominal:     Tenderness: There is no abdominal tenderness. There is no guarding or rebound.  Lymphadenopathy:     Head:     Right side of head: No submandibular, tonsillar or occipital adenopathy.     Left side of head: No submandibular, tonsillar or occipital adenopathy.     Cervical: No cervical adenopathy.  Skin:    Coloration: Skin is not pale.     Findings: No abrasion, erythema, petechiae or rash. Rash is not papular, urticarial or vesicular.  Neurological:     Mental Status: She is alert.  Psychiatric:        Behavior: Behavior is cooperative.      Diagnostic studies:    Spirometry: results abnormal (FEV1: 0.80/39%, FVC: 1.01/38%, FEV1/FVC: 79%).    Spirometry consistent with possible restrictive disease.  This is stable compared to her previous spirometric findings.    Allergy Studies: none        Malachi Bonds, MD  [facility]

## 2022-11-17 ENCOUNTER — Ambulatory Visit (INDEPENDENT_AMBULATORY_CARE_PROVIDER_SITE_OTHER): Payer: Medicare HMO | Admitting: *Deleted

## 2022-11-17 DIAGNOSIS — J309 Allergic rhinitis, unspecified: Secondary | ICD-10-CM

## 2022-11-18 ENCOUNTER — Telehealth: Payer: Self-pay

## 2022-11-18 NOTE — Telephone Encounter (Signed)
Received AZ&ME fax for medication refill on Breztri.  AZ&ME provider form has been placed in provider's in basket for completion.  Forwarding message as update.

## 2022-11-18 NOTE — Telephone Encounter (Signed)
Completed AZ&ME provider form has been faxed to [phone] and labeled, placed in Bulk Scanning basket for scanning.

## 2022-11-19 DIAGNOSIS — M25532 Pain in left wrist: Secondary | ICD-10-CM | POA: Diagnosis not present

## 2022-12-01 ENCOUNTER — Ambulatory Visit (INDEPENDENT_AMBULATORY_CARE_PROVIDER_SITE_OTHER): Payer: Medicare HMO

## 2022-12-01 DIAGNOSIS — J309 Allergic rhinitis, unspecified: Secondary | ICD-10-CM | POA: Diagnosis not present

## 2022-12-15 ENCOUNTER — Ambulatory Visit (INDEPENDENT_AMBULATORY_CARE_PROVIDER_SITE_OTHER): Payer: Medicare HMO

## 2022-12-15 DIAGNOSIS — J309 Allergic rhinitis, unspecified: Secondary | ICD-10-CM

## 2022-12-23 ENCOUNTER — Ambulatory Visit (INDEPENDENT_AMBULATORY_CARE_PROVIDER_SITE_OTHER): Payer: Medicare HMO

## 2022-12-23 DIAGNOSIS — J309 Allergic rhinitis, unspecified: Secondary | ICD-10-CM | POA: Diagnosis not present

## 2022-12-28 ENCOUNTER — Ambulatory Visit (INDEPENDENT_AMBULATORY_CARE_PROVIDER_SITE_OTHER): Payer: Medicare HMO | Admitting: *Deleted

## 2022-12-28 DIAGNOSIS — J309 Allergic rhinitis, unspecified: Secondary | ICD-10-CM | POA: Diagnosis not present

## 2022-12-29 ENCOUNTER — Encounter: Payer: Self-pay | Admitting: Family Medicine

## 2022-12-29 ENCOUNTER — Ambulatory Visit (INDEPENDENT_AMBULATORY_CARE_PROVIDER_SITE_OTHER): Payer: Medicare HMO | Admitting: Family Medicine

## 2022-12-29 VITALS — BP 158/90 | HR 93 | Temp 95.6°F | Resp 20 | Ht 63.0 in | Wt 157.0 lb

## 2022-12-29 DIAGNOSIS — Z23 Encounter for immunization: Secondary | ICD-10-CM

## 2022-12-29 DIAGNOSIS — E782 Mixed hyperlipidemia: Secondary | ICD-10-CM

## 2022-12-29 DIAGNOSIS — F411 Generalized anxiety disorder: Secondary | ICD-10-CM | POA: Diagnosis not present

## 2022-12-29 DIAGNOSIS — M8588 Other specified disorders of bone density and structure, other site: Secondary | ICD-10-CM

## 2022-12-29 DIAGNOSIS — I10 Essential (primary) hypertension: Secondary | ICD-10-CM

## 2022-12-29 DIAGNOSIS — Z1211 Encounter for screening for malignant neoplasm of colon: Secondary | ICD-10-CM

## 2022-12-29 DIAGNOSIS — Z0000 Encounter for general adult medical examination without abnormal findings: Secondary | ICD-10-CM

## 2022-12-29 DIAGNOSIS — Z0001 Encounter for general adult medical examination with abnormal findings: Secondary | ICD-10-CM | POA: Diagnosis not present

## 2022-12-29 DIAGNOSIS — R42 Dizziness and giddiness: Secondary | ICD-10-CM | POA: Diagnosis not present

## 2022-12-29 MED ORDER — AMLODIPINE BESYLATE 2.5 MG PO TABS: 2.5000 mg | ORAL_TABLET | Freq: Every day | ORAL | 3 refills | Status: DC

## 2022-12-29 MED ORDER — ALPRAZOLAM 0.5 MG PO TABS
0.5000 mg | ORAL_TABLET | Freq: Every day | ORAL | 1 refills | Status: DC | PRN
Start: 2022-12-29 — End: 2023-07-04

## 2022-12-29 MED ORDER — ATORVASTATIN CALCIUM 40 MG PO TABS: 40.0000 mg | ORAL_TABLET | Freq: Every day | ORAL | 3 refills | Status: DC

## 2022-12-29 MED ORDER — MECLIZINE HCL 12.5 MG PO TABS
12.5000 mg | ORAL_TABLET | Freq: Three times a day (TID) | ORAL | 1 refills | Status: DC | PRN
Start: 2022-12-29 — End: 2023-07-04

## 2022-12-29 MED ORDER — CITALOPRAM HYDROBROMIDE 20 MG PO TABS: ORAL_TABLET | ORAL | 3 refills | Status: DC

## 2022-12-29 MED ORDER — HYDROCHLOROTHIAZIDE 25 MG PO TABS
12.5000 mg | ORAL_TABLET | Freq: Every day | ORAL | 3 refills | Status: DC | PRN
Start: 2022-12-29 — End: 2023-12-26

## 2022-12-29 NOTE — Patient Instructions (Signed)
Preventive Care 65 Years and Older, Female Preventive care refers to lifestyle choices and visits with your health care provider that can promote health and wellness. Preventive care visits are also called wellness exams. What can I expect for my preventive care visit? Counseling Your health care provider may ask you questions about your: Medical history, including: Past medical problems. Family medical history. Pregnancy and menstrual history. History of falls. Current health, including: Memory and ability to understand (cognition). Emotional well-being. Home life and relationship well-being. Sexual activity and sexual health. Lifestyle, including: Alcohol, nicotine or tobacco, and drug use. Access to firearms. Diet, exercise, and sleep habits. Work and work environment. Sunscreen use. Safety issues such as seatbelt and bike helmet use. Physical exam Your health care provider will check your: Height and weight. These may be used to calculate your BMI (body mass index). BMI is a measurement that tells if you are at a healthy weight. Waist circumference. This measures the distance around your waistline. This measurement also tells if you are at a healthy weight and may help predict your risk of certain diseases, such as type 2 diabetes and high blood pressure. Heart rate and blood pressure. Body temperature. Skin for abnormal spots. What immunizations do I need?  Vaccines are usually given at various ages, according to a schedule. Your health care provider will recommend vaccines for you based on your age, medical history, and lifestyle or other factors, such as travel or where you work. What tests do I need? Screening Your health care provider may recommend screening tests for certain conditions. This may include: Lipid and cholesterol levels. Hepatitis C test. Hepatitis B test. HIV (human immunodeficiency virus) test. STI (sexually transmitted infection) testing, if you are at  risk. Lung cancer screening. Colorectal cancer screening. Diabetes screening. This is done by checking your blood sugar (glucose) after you have not eaten for a while (fasting). Mammogram. Talk with your health care provider about how often you should have regular mammograms. BRCA-related cancer screening. This may be done if you have a family history of breast, ovarian, tubal, or peritoneal cancers. Bone density scan. This is done to screen for osteoporosis. Talk with your health care provider about your test results, treatment options, and if necessary, the need for more tests. Follow these instructions at home: Eating and drinking  Eat a diet that includes fresh fruits and vegetables, whole grains, lean protein, and low-fat dairy products. Limit your intake of foods with high amounts of sugar, saturated fats, and salt. Take vitamin and mineral supplements as recommended by your health care provider. Do not drink alcohol if your health care provider tells you not to drink. If you drink alcohol: Limit how much you have to 0-1 drink a day. Know how much alcohol is in your drink. In the [place] one drink equals one 12 oz bottle of beer (355 mL), one 5 oz glass of wine (148 mL), or one 1 oz glass of hard liquor (44 mL). Lifestyle Brush your teeth every morning and night with fluoride toothpaste. Floss one time each day. Exercise for at least 30 minutes 5 or more days each week. Do not use any products that contain nicotine or tobacco. These products include cigarettes, chewing tobacco, and vaping devices, such as e-cigarettes. If you need help quitting, ask your health care provider. Do not use drugs. If you are sexually active, practice safe sex. Use a condom or other form of protection in order to prevent STIs. Take aspirin only as told by   your health care provider. Make sure that you understand how much to take and what form to take. Work with your health care provider to find out whether it  is safe and beneficial for you to take aspirin daily. Ask your health care provider if you need to take a cholesterol-lowering medicine (statin). Find healthy ways to manage stress, such as: Meditation, yoga, or listening to music. Journaling. Talking to a trusted person. Spending time with friends and family. Minimize exposure to UV radiation to reduce your risk of skin cancer. Safety Always wear your seat belt while driving or riding in a vehicle. Do not drive: If you have been drinking alcohol. Do not ride with someone who has been drinking. When you are tired or distracted. While texting. If you have been using any mind-altering substances or drugs. Wear a helmet and other protective equipment during sports activities. If you have firearms in your house, make sure you follow all gun safety procedures. What's next? Visit your health care provider once a year for an annual wellness visit. Ask your health care provider how often you should have your eyes and teeth checked. Stay up to date on all vaccines. This information is not intended to replace advice given to you by your health care provider. Make sure you discuss any questions you have with your health care provider. Document Revised: 12/17/2020 Document Reviewed: 12/17/2020 Elsevier Patient Education  2024 [employer]  

## 2022-12-29 NOTE — Progress Notes (Signed)
Misty Clark is a 72 y.o. female presents to office today for annual physical exam examination.    Concerns today include: 1.  None.  Overall she reports that she is doing well.  She has had a little bit more anxiety as of late and subsequently has been utilizing her Xanax little more than normal so will need a refill on that.  She overall feels like she is responding well to Dr. Ellouise Newer treatments with the asthma shot and allergy shots.  She just had her shot yesterday so she wants to hold off on getting her second shingles vaccination.  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   Hypertension    IBS (irritable bowel syndrome)    Mixed   Multiple allergies    Recurrent upper respiratory infection (URI)    Social History   Socioeconomic History   Marital status: Widowed    Spouse name: Not on file   Number of children: 4   Years of education: Not on file   Highest education level: Some college, no degree  Occupational History   Occupation: Retired Nursing    Comment: Engineer, petroleum [employer]  Tobacco Use   Smoking status: Former    Packs/day: 0.50    Years: 3.00    Additional pack years: 0.00    Total pack years: 1.50    Types: Cigarettes   Smokeless tobacco: Never   Tobacco comments:    Quit many years ago.  Vaping Use   Vaping Use: Never used  Substance and Sexual Activity   Alcohol use: No    Drug use: Never   Sexual activity: Not on file  Other Topics Concern   Not on file  Social History Narrative   Retired Arts administrator. 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 Lives alone. Enjoys crafting, puzzles, reading, and gardening.    Social Determinants of Health   Financial Resource Strain: Low Risk  (09/29/2022)   Overall Financial Resource Strain (CARDIA)    Difficulty of Paying Living Expenses: Not hard at all  Food Insecurity: No Food Insecurity (09/29/2022)   Hunger Vital Sign    Worried About Running Out of Food in the Last Year: Never true    Ran Out of Food in the Last Year: Never true  Transportation Needs: No Transportation Needs (09/29/2022)   PRAPARE - Administrator, Civil Service (Medical): No    Lack of Transportation (Non-Medical): No  Physical Activity: Sufficiently Active (09/29/2022)   Exercise Vital Sign    Days of Exercise per Week: 5 days    Minutes of Exercise per Session: 30 min  Stress: No Stress Concern Present (09/29/2022)   [employer] of Occupational Health - Occupational Stress Questionnaire    Feeling of Stress : Not at all  Social Connections: Moderately Integrated (09/29/2022)   Social Connection and Isolation Panel [NHANES]    Frequency of Communication with Friends and Family: More than three times a week    Frequency of  Social Gatherings with Friends and Family: More than three times a week    Attends Religious Services: More than 4 times per year    Active Member of [employer] or Organizations: Yes    Attends Banker Meetings: More than 4 times per year    Marital Status: Widowed  Intimate Partner Violence: Not At Risk (09/29/2022)   Humiliation, Afraid, Rape, and Kick questionnaire    Fear of Current or Ex-Partner: No    Emotionally Abused: No    Physically Abused: No    Sexually Abused: No   Past Surgical History:  Procedure Laterality Date   ABDOMINAL HYSTERECTOMY     CHOLECYSTECTOMY     SHOULDER SURGERY  Left 2012   SINOSCOPY     SINUS SURGERY WITH INSTATRAK     Family History  Problem Relation Age of Onset   Heart failure Maternal Grandmother        CHF   Breast cancer Sister    Heart attack Paternal Grandfather        MI   Coronary artery disease Paternal Grandfather        At a premature age   Coronary artery disease Other    Colon cancer Other    Prostate cancer Other    Ovarian cancer Other    Asthma Mother    Thyroid disease Mother    Heart disease Father    Hypertension Daughter    Allergic rhinitis Neg Hx    Angioedema Neg Hx    Atopy Neg Hx    Eczema Neg Hx    Immunodeficiency Neg Hx    Urticaria Neg Hx     Current Outpatient Medications:    albuterol (VENTOLIN HFA) 108 (90 Base) MCG/ACT inhaler, INHALE 2 PUFFS BY MOUTH EVERY 6 HOURS AS NEEDED FOR WHEEZING AND FOR SHORTNESS OF BREATH (NEEDS TO BE SEEN BEFORE NEXT REFILL), Disp: 18 g, Rfl: 0   ALPRAZolam (XANAX) 0.5 MG tablet, Take 1 tablet (0.5 mg total) by mouth daily as needed for anxiety. Place on file. Doesn't need yet, Disp: 20 tablet, Rfl: 1   amLODipine (NORVASC) 2.5 MG tablet, Take 1 tablet (2.5 mg total) by mouth daily. For high blood pressure, Disp: 90 tablet, Rfl: 3   atorvastatin (LIPITOR) 40 MG tablet, Take 1 tablet (40 mg total) by mouth daily., Disp: 90 tablet, Rfl: 3   Budeson-Glycopyrrol-Formoterol (BREZTRI AEROSPHERE) 160-9-4.8 MCG/ACT AERO, Inhale 2 puffs into the lungs in the morning and at bedtime., Disp: 1 each, Rfl: 5   citalopram (CELEXA) 20 MG tablet, TAKE 1 TABLET BY MOUTH ONCE DAILY ., Disp: 90 tablet, Rfl: 3   EPINEPHrine 0.3 mg/0.3 mL IJ SOAJ injection, Inject 0.3 mg into the muscle as needed for anaphylaxis. As needed for life-threatening allergic reactions, Disp: 2 each, Rfl: 1   famotidine (PEPCID) 40 MG tablet, Take 1 tablet (40 mg total) by mouth daily., Disp: 90 tablet, Rfl: 1   fluconazole (DIFLUCAN) 150 MG tablet, Take 150 mg by mouth once., Disp: , Rfl:    fluticasone (FLONASE) 50  MCG/ACT nasal spray, Place 2 sprays into both nostrils daily., Disp: 16 g, Rfl: 6   hydrochlorothiazide (HYDRODIURIL) 25 MG tablet, Take 0.5-1 tablets (12.5-25 mg total) by mouth daily as needed (leg swelling)., Disp: 90 tablet, Rfl: 3   ipratropium (ATROVENT) 0.06 % nasal spray, Place 1 spray into both nostrils 3 (three) times daily. every 8 hours as needed to help with drying out your nose, Disp: 45 mL, Rfl: 3  levocetirizine (XYZAL) 5 MG tablet, Take 1 tablet (5 mg total) by mouth every evening., Disp: 90 tablet, Rfl: 3   meclizine (ANTIVERT) 12.5 MG tablet, Take 1 tablet (12.5 mg total) by mouth 3 (three) times daily as needed for dizziness., Disp: 30 tablet, Rfl: 1   montelukast (SINGULAIR) 10 MG tablet, Take 1 tablet (10 mg total) by mouth at bedtime., Disp: 90 tablet, Rfl: 3  Allergies  Allergen Reactions   Codeine     REACTION: vomiting   Sulfonamide Derivatives     REACTION: rash     ROS: Review of Systems Pertinent items noted in HPI and remainder of comprehensive ROS otherwise negative.    Physical exam BP (!) 158/90   Pulse 93   Temp (!) 95.6 F (35.3 C) (Oral)   Resp 20   Ht 5\' 3"  (1.6 m)   Wt 157 lb (71.2 kg)   SpO2 94%   BMI 27.81 kg/m  General appearance: alert, cooperative, appears stated age, and no distress Head: Normocephalic, without obvious abnormality, atraumatic Eyes: negative findings: lids and lashes normal, conjunctivae and sclerae normal, corneas clear, and pupils equal, round, reactive to light and accomodation Ears: normal TM's and external ear canals both ears Nose: Nares normal. Septum midline. Mucosa normal. No drainage or sinus tenderness. Throat: lips, mucosa, and tongue normal; teeth and gums normal Neck: no adenopathy, no carotid bruit, supple, symmetrical, trachea midline, and thyroid not enlarged, symmetric, no tenderness/mass/nodules Back: symmetric, no curvature. ROM normal. No CVA tenderness. Lungs: clear to auscultation  bilaterally Heart: regular rate and rhythm, S1, S2 normal, no murmur, click, rub or gallop Abdomen: soft, non-tender; bowel sounds normal; no masses,  no organomegaly Extremities: extremities normal, atraumatic, no cyanosis or edema Pulses: 2+ and symmetric Skin: Skin color, texture, turgor normal. No rashes or lesions Lymph nodes: Cervical, supraclavicular, and axillary nodes normal. Neurologic: Grossly normal    12/29/2022   11:15 AM 09/29/2022   10:28 AM 09/10/2022   10:10 AM  Depression screen PHQ 2/9  Decreased Interest 1 0 0  Down, Depressed, Hopeless 1 0 0  PHQ - 2 Score 2 0 0  Altered sleeping 1 0 0  Tired, decreased energy 0 0 0  Change in appetite 0 0 0  Feeling bad or failure about yourself  1 0 0  Trouble concentrating 1 0 0  Moving slowly or fidgety/restless 0 0 0  Suicidal thoughts 0 0 0  PHQ-9 Score 5 0 0  Difficult doing work/chores Not difficult at all Not difficult at all Not difficult at all      12/29/2022   11:16 AM 09/10/2022   10:10 AM 08/23/2022   10:13 AM 06/15/2022   10:23 AM  GAD 7 : Generalized Anxiety Score  Nervous, Anxious, on Edge 1 0 0 0  Control/stop worrying 1 0 0 0  Worry too much - different things 1 0 0 0  Trouble relaxing 1 0 0 0  Restless 0 0 0 0  Easily annoyed or irritable 0 0 0 0  Afraid - awful might happen 0 0 0 0  Total GAD 7 Score 4 0 0 0  Anxiety Difficulty Not difficult at all Not difficult at all          Assessment/ Plan: [name] here for annual physical exam.   Annual physical exam  Special screening for malignant neoplasms, colon - Plan: CBC, Cologuard  Need for shingles vaccine  Essential hypertension, benign - Plan: CMP14+EGFR, amLODipine (NORVASC) 2.5  MG tablet, hydrochlorothiazide (HYDRODIURIL) 25 MG tablet  Mixed hyperlipidemia - Plan: CMP14+EGFR, Lipid Panel, TSH, atorvastatin (LIPITOR) 40 MG tablet  Osteopenia of lumbar spine - Plan: VITAMIN D 25 Hydroxy (Vit-D Deficiency,  Fractures)  Generalized anxiety disorder - Plan: ALPRAZolam (XANAX) 0.5 MG tablet, citalopram (CELEXA) 20 MG tablet  Vertigo - Plan: meclizine (ANTIVERT) 12.5 MG tablet  Cologuard reordered.  She declined shingles vaccination today but will have this done at a later date and time.  Will plan for DEXA scan at a later date  Blood pressure NOT controlled.  No changes for now.  I would like her to return in 2 weeks for blood pressure recheck with nurse.  If persistently out of goal we will advance her Norvasc to 5 mg daily.  Fasting lipid, liver function tests and TSH ordered.  Continue statin  Check vitamin D given history of osteopenia.  DEXA scan at next visit  Anxiety disorder is chronic.  UDS and CSA are up-to-date.  National narcotic database reviewed and there were no red flags.  Continue Xanax sparingly.  Plan to update MMSE at next visit  Not actively having vertigo but wanted to have meclizine on hand if needed  Counseled on healthy lifestyle choices, including diet (rich in fruits, vegetables and lean meats and low in salt and simple carbohydrates) and exercise (at least 30 minutes of moderate physical activity daily).  Patient to follow up in 1 year for annual exam or sooner if needed.  Matraca Hunkins M. Nadine Counts, DO

## 2022-12-30 LAB — CMP14+EGFR
ALT: 9 IU/L (ref 0–32)
AST: 14 IU/L (ref 0–40)
Albumin: 4.6 g/dL (ref 3.8–4.8)
Alkaline Phosphatase: 76 IU/L (ref 44–121)
BUN/Creatinine Ratio: 11 — ABNORMAL LOW (ref 12–28)
BUN: 7 mg/dL — ABNORMAL LOW (ref 8–27)
Bilirubin Total: 0.5 mg/dL (ref 0.0–1.2)
CO2: 21 mmol/L (ref 20–29)
Calcium: 9.5 mg/dL (ref 8.7–10.3)
Chloride: 96 mmol/L (ref 96–106)
Creatinine, Ser: 0.65 mg/dL (ref 0.57–1.00)
Globulin, Total: 2.2 g/dL (ref 1.5–4.5)
Glucose: 96 mg/dL (ref 70–99)
Potassium: 4.5 mmol/L (ref 3.5–5.2)
Sodium: 135 mmol/L (ref 134–144)
Total Protein: 6.8 g/dL (ref 6.0–8.5)
eGFR: 93 mL/min/{1.73_m2} (ref >59)

## 2022-12-30 LAB — LIPID PANEL
Chol/HDL Ratio: 3.3 ratio (ref 0.0–4.4)
Cholesterol, Total: 235 mg/dL — ABNORMAL HIGH (ref 100–199)
HDL: 71 mg/dL (ref >39)
LDL Chol Calc (NIH): 143 mg/dL — ABNORMAL HIGH (ref 0–99)
Triglycerides: 122 mg/dL (ref 0–149)
VLDL Cholesterol Cal: 21 mg/dL (ref 5–40)

## 2022-12-30 LAB — CBC
Hematocrit: 39.4 % (ref 34.0–46.6)
Hemoglobin: 13.7 g/dL (ref 11.1–15.9)
MCH: 31.4 pg (ref 26.6–33.0)
MCHC: 34.8 g/dL (ref 31.5–35.7)
MCV: 90 fL (ref 79–97)
Platelets: 220 10*3/uL (ref 150–450)
RBC: 4.37 x10E6/uL (ref 3.77–5.28)
RDW: 11.7 % (ref 11.7–15.4)
WBC: 6.1 10*3/uL (ref 3.4–10.8)

## 2022-12-30 LAB — TSH: TSH: 1.57 u[IU]/mL (ref 0.450–4.500)

## 2022-12-30 LAB — VITAMIN D 25 HYDROXY (VIT D DEFICIENCY, FRACTURES): Vit D, 25-Hydroxy: 14.3 ng/mL — ABNORMAL LOW (ref 30.0–100.0)

## 2023-01-04 ENCOUNTER — Other Ambulatory Visit: Payer: Self-pay | Admitting: Family

## 2023-01-04 MED ORDER — VITAMIN D (ERGOCALCIFEROL) 1.25 MG (50000 UNIT) PO CAPS: 50000.0000 [IU] | ORAL_CAPSULE | ORAL | 3 refills | Status: DC

## 2023-01-13 ENCOUNTER — Ambulatory Visit (INDEPENDENT_AMBULATORY_CARE_PROVIDER_SITE_OTHER): Payer: Medicare HMO | Admitting: *Deleted

## 2023-01-13 ENCOUNTER — Ambulatory Visit: Payer: Medicare HMO

## 2023-01-13 DIAGNOSIS — J309 Allergic rhinitis, unspecified: Secondary | ICD-10-CM

## 2023-01-14 NOTE — Telephone Encounter (Signed)
Received a fax confirmation of delivery for patients Breztri to be delivered today to the patient's home. Delivery form has been labeled and placed in patients chart.

## 2023-01-19 ENCOUNTER — Ambulatory Visit (INDEPENDENT_AMBULATORY_CARE_PROVIDER_SITE_OTHER): Payer: Medicare HMO | Admitting: *Deleted

## 2023-01-19 DIAGNOSIS — J309 Allergic rhinitis, unspecified: Secondary | ICD-10-CM | POA: Diagnosis not present

## 2023-01-28 ENCOUNTER — Ambulatory Visit (INDEPENDENT_AMBULATORY_CARE_PROVIDER_SITE_OTHER): Payer: Medicare HMO | Admitting: *Deleted

## 2023-01-28 DIAGNOSIS — J309 Allergic rhinitis, unspecified: Secondary | ICD-10-CM | POA: Diagnosis not present

## 2023-02-03 ENCOUNTER — Telehealth: Payer: Self-pay | Admitting: Family Medicine

## 2023-02-03 ENCOUNTER — Ambulatory Visit (INDEPENDENT_AMBULATORY_CARE_PROVIDER_SITE_OTHER): Payer: Medicare HMO

## 2023-02-03 DIAGNOSIS — J309 Allergic rhinitis, unspecified: Secondary | ICD-10-CM

## 2023-02-03 NOTE — Telephone Encounter (Signed)
Called patient and she states that she could not schedule appt at this time - she will CB when she can

## 2023-02-03 NOTE — Telephone Encounter (Signed)
Pt called requesting that Dr Nadine Counts send her in a Rx for UTI. Explained to pt that she needs an appt and offered to make her one. Pt got very rude and said that she just needed me to leave Dr Nadine Counts a message telling her that she is having UTI symptoms so she could send something in for her. Please advise.

## 2023-02-04 NOTE — Telephone Encounter (Signed)
LMOVM to offer virtual appt for today please call the office back to schedule.

## 2023-02-04 NOTE — Telephone Encounter (Signed)
That is not appropriate.  The office policy is that appropriate visit needs to be held.  She is welcome to a virtual one with me today.  I have 3 open.

## 2023-02-11 ENCOUNTER — Ambulatory Visit (INDEPENDENT_AMBULATORY_CARE_PROVIDER_SITE_OTHER): Payer: Medicare HMO

## 2023-02-11 DIAGNOSIS — J309 Allergic rhinitis, unspecified: Secondary | ICD-10-CM | POA: Diagnosis not present

## 2023-02-16 ENCOUNTER — Ambulatory Visit (INDEPENDENT_AMBULATORY_CARE_PROVIDER_SITE_OTHER): Payer: Medicare HMO | Admitting: *Deleted

## 2023-02-16 DIAGNOSIS — J309 Allergic rhinitis, unspecified: Secondary | ICD-10-CM | POA: Diagnosis not present

## 2023-02-25 ENCOUNTER — Ambulatory Visit (INDEPENDENT_AMBULATORY_CARE_PROVIDER_SITE_OTHER): Payer: Medicare HMO | Admitting: *Deleted

## 2023-02-25 DIAGNOSIS — J309 Allergic rhinitis, unspecified: Secondary | ICD-10-CM

## 2023-03-03 ENCOUNTER — Ambulatory Visit (INDEPENDENT_AMBULATORY_CARE_PROVIDER_SITE_OTHER): Payer: Medicare HMO | Admitting: *Deleted

## 2023-03-03 DIAGNOSIS — J309 Allergic rhinitis, unspecified: Secondary | ICD-10-CM | POA: Diagnosis not present

## 2023-03-10 ENCOUNTER — Ambulatory Visit (INDEPENDENT_AMBULATORY_CARE_PROVIDER_SITE_OTHER): Payer: Medicare HMO

## 2023-03-10 DIAGNOSIS — J309 Allergic rhinitis, unspecified: Secondary | ICD-10-CM

## 2023-03-17 ENCOUNTER — Telehealth: Payer: Self-pay

## 2023-03-17 ENCOUNTER — Encounter: Payer: Self-pay | Admitting: Allergy & Immunology

## 2023-03-17 ENCOUNTER — Encounter: Payer: Self-pay | Admitting: Family Medicine

## 2023-03-17 ENCOUNTER — Ambulatory Visit (INDEPENDENT_AMBULATORY_CARE_PROVIDER_SITE_OTHER): Payer: Medicare HMO | Admitting: Allergy & Immunology

## 2023-03-17 ENCOUNTER — Other Ambulatory Visit: Payer: Self-pay

## 2023-03-17 VITALS — BP 146/82 | HR 90 | Temp 98.2°F | Ht 63.0 in | Wt 154.9 lb

## 2023-03-17 DIAGNOSIS — J302 Other seasonal allergic rhinitis: Secondary | ICD-10-CM | POA: Diagnosis not present

## 2023-03-17 DIAGNOSIS — J3089 Other allergic rhinitis: Secondary | ICD-10-CM

## 2023-03-17 DIAGNOSIS — J4550 Severe persistent asthma, uncomplicated: Secondary | ICD-10-CM | POA: Diagnosis not present

## 2023-03-17 DIAGNOSIS — K219 Gastro-esophageal reflux disease without esophagitis: Secondary | ICD-10-CM | POA: Diagnosis not present

## 2023-03-17 DIAGNOSIS — J0100 Acute maxillary sinusitis, unspecified: Secondary | ICD-10-CM | POA: Diagnosis not present

## 2023-03-17 MED ORDER — FLUTICASONE PROPIONATE 50 MCG/ACT NA SUSP: 2.0000 | Freq: Every day | NASAL | 6 refills | Status: DC

## 2023-03-17 MED ORDER — AMOXICILLIN-POT CLAVULANATE 875-125 MG PO TABS: 1.0000 | ORAL_TABLET | Freq: Two times a day (BID) | ORAL | 0 refills | Status: AC

## 2023-03-17 MED ORDER — PREDNISONE 10 MG PO TABS: ORAL_TABLET | ORAL | 0 refills | Status: DC

## 2023-03-17 NOTE — Telephone Encounter (Signed)
Received Missing Information Notification fax from [facility] - DOB verified - requesting new prescription for patient.  Form placed in provider's in basket - forwarding message to provider as update.

## 2023-03-17 NOTE — Patient Instructions (Addendum)
1. Chronic rhinitis (grasses, trees and indoor molds) - with overlying sinusitis - You are doing well with the allergy shots.  - You are in the most concentrated vial, which is good news. - This should make the spring time much more bearable.  - Continue with Zyrtec (cetirizine) 10mg  tablet once daily and Singulair (montelukast) 10mg  daily and Flonase (fluticasone) one spray per nostril daily  - Continue with nasal ipratropium every 8 hours as needed to help with drying out your nose.  - Continue with nasal saline rinses 1-2 times daily to remove allergens from the nasal cavities as well as help with mucous clearance (this is especially helpful to do before the nasal sprays are given)  - You have given it a good college try, but I think we need to start an antibiotic: Augmentin 875mg  twice daily for ten days  - ADD ON the Mucinex 600-1200mg  twice daily. - We will send in prednisone and you can start it if you are not feeling better in 1-2 days.   2. Mild persistent asthma, uncomplicated - Lung testing not done today. - We are not going to make any medication changes today.  - Daily controller medication(s): Breztri two puffs twice daily + Singulair (montelukast) 10mg  + Tezspire monthly - Prior to physical activity: albuterol 2 puffs 10-15 minutes before physical activity. - Rescue medications: albuterol 4 puffs every 4-6 hours as needed - Asthma control goals:  * Full participation in all desired activities (may need albuterol before activity) * Albuterol use two time or less a week on average (not counting use with activity) * Cough interfering with sleep two time or less a month * Oral steroids no more than once a year * No hospitalizations.  3. Return in about 3 months (around 06/16/2023).   Please inform us of any Emergency Department visits, hospitalizations, or changes in symptoms. Call us before going to the ED for breathing or allergy symptoms since we might be able to fit you in  for a sick visit. Feel free to contact us anytime with any questions, problems, or concerns.  It was a pleasure to see you again today!  Websites that have reliable patient information: 1. American Academy of Asthma, Allergy, and Immunology: www.aaaai.org 2. Food Allergy Research and Education (FARE): foodallergy.org 3. Mothers of Asthmatics: http://www.asthmacommunitynetwork.org 4. American College of Allergy, Asthma, and Immunology: www.acaai.org   COVID-19 Vaccine Information can be found at: [email or web address] For questions related to vaccine distribution or appointments, please email [email or web address] or call [phone].   We realize that you might be concerned about having an allergic reaction to the COVID19 vaccines. To help with that concern, WE ARE OFFERING THE COVID19 VACCINES IN OUR OFFICE! Ask the front desk for dates!     "Like" [place] on Facebook and Instagram for our latest updates!      A healthy democracy works best when [employer] participate! Make sure you are registered to vote! If you have moved or changed any of your contact information, you will need to get this updated before voting!  In some cases, you MAY be able to register to vote online: [email or web address]

## 2023-03-17 NOTE — Progress Notes (Signed)
FOLLOW UP  Date of Service/Encounter:  03/17/23   Assessment:   Asthma COPD overlap syndrome - with remote history of smoking for 3 to 4 years back in high school (planning to add Tezspire to help with symptoms)   Consider retesting alpha-1 antitrypsin level at the next visit   Persistent restrictive lung disease - not responsive to prednisone   Seasonal and perennial allergic rhinitis (grasses, trees and indoor molds) - with new concern for pet exposure    Trouble affording medications - improved since [place] the [place] [facility] [place]  Plan/Recommendations:   1. Chronic rhinitis (grasses, trees and indoor molds) - with overlying sinusitis - You are doing well with the allergy shots.  - You are in the most concentrated vial, which is good news. - This should make the spring time much more bearable.  - Continue with Zyrtec (cetirizine) 10mg  tablet once daily and Singulair (montelukast) 10mg  daily and Flonase (fluticasone) one spray per nostril daily  - Continue with nasal ipratropium every 8 hours as needed to help with drying out your nose.  - Continue with nasal saline rinses 1-2 times daily to remove allergens from the nasal cavities as well as help with mucous clearance (this is especially helpful to do before the nasal sprays are given)  - You have given it a good college try, but I think we need to start an antibiotic: Augmentin 875mg  twice daily for ten days  - ADD ON the Mucinex 600-1200mg  twice daily. - We will send in prednisone and you can start it if you are not feeling better in 1-2 days.   2. Mild persistent asthma, uncomplicated - Lung testing not done today. - We are not going to make any medication changes today.  - Daily controller medication(s): Breztri two puffs twice daily + Singulair (montelukast) 10mg  + Tezspire monthly - Prior to physical activity: albuterol 2 puffs 10-15 minutes before physical activity. - Rescue medications: albuterol 4 puffs every 4-6 hours as  needed - Asthma control goals:  * Full participation in all desired activities (may need albuterol before activity) * Albuterol use two time or less a week on average (not counting use with activity) * Cough interfering with sleep two time or less a month * Oral steroids no more than once a year * No hospitalizations.  3. Return in about 3 months (around 06/16/2023).  Subjective:   Misty Clark is a 72 y.o. 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  Misty Clark is a 72 y.o. female presenting for a follow up visit.  She was last seen in May 2024.  At that time, she was doing very well with her allergy shots.  We continue with ipratropium as well as Flonase, Zyrtec, and Singulair.  For her asthma, it looks stable.  We continue with  Breztri 2 puffs twice daily as well as Singulair and Tezspire.   Asthma/Respiratory Symptom History: She remains on the [place] and the [place].  She does have [place] through [place] [facility].  This combination seems to be working well.  She has not been using prednisone as much.  She has not been to the emergency room for breathing.  Allergic Rhinitis Symptom History: She reports that two weeks ago, she has had worsening sinus symptoms. She has been doing her nose sprays and all of her  medications are prescribed. She has not been able to shake this.  Shots are helping a lot. She is very happy with how her breathing is going. She has been using Sudafed recently and Xyzal. She is having intense drainage despite all of this.   [name] is on allergen immunotherapy. She receives two injections. Immunotherapy script #1 contains molds. She currently receives 0.50mL of the RED vial (1/100). Immunotherapy script #2 contains trees and grasses. She currently receives 0.36mL of the RED vial (1/100). She started shots November of 2023 and reached maintenance in August 2024.  She has not had any large local reactions.  Otherwise, there have been no changes to her past medical history, surgical history, family history, or social history.    Review of systems otherwise negative other than that mentioned in the HPI.    Objective:   Blood pressure (!) 146/82, pulse 90, temperature 98.2 F (36.8 C), temperature source Temporal, height 5\' 3"  (1.6 m), weight 154 lb 14.4 oz (70.3 kg), SpO2 96%. Body mass index is 27.44 kg/m.    Physical Exam Vitals reviewed.  Constitutional:      Appearance: She is well-developed.     Comments: Speaking in full sentences.  Very talkative.  Not ill-appearing.  HENT:     Head: Normocephalic and atraumatic.     Right Ear: Tympanic membrane, ear canal and external ear normal. No drainage, swelling or tenderness. Tympanic membrane is not injected, scarred, erythematous, retracted or bulging.     Left Ear: Tympanic membrane, ear canal and external ear normal. No drainage, swelling or tenderness. Tympanic membrane is not injected, scarred, erythematous, retracted or bulging.     Nose: No nasal deformity, septal deviation, mucosal edema or rhinorrhea.     Right Turbinates: Enlarged, swollen and pale.     Left Turbinates: Enlarged, swollen and pale.     Right Sinus: No maxillary sinus tenderness or frontal sinus tenderness.     Left Sinus: No maxillary sinus  tenderness or frontal sinus tenderness.     Mouth/Throat:     Mouth: Mucous membranes are not pale and not dry.     Pharynx: Uvula midline.  Eyes:     General: Lids are normal.        Right eye: No discharge.        Left eye: No discharge.     Conjunctiva/sclera: Conjunctivae normal.     Right eye: Right conjunctiva is not injected. No chemosis.    Left eye: Left conjunctiva is not injected. No chemosis.    Pupils: Pupils are equal, round, and reactive to light.  Cardiovascular:     Rate and Rhythm: Normal rate and regular rhythm.     Heart sounds: Normal heart sounds.  Pulmonary:     Effort: Pulmonary effort is normal. No tachypnea, accessory muscle usage or respiratory distress.     Breath sounds: Normal breath sounds. No wheezing, rhonchi or rales.     Comments: Moving air well in  all lung fields. No increased work of breathing noted.  Chest:     Chest wall: No tenderness.  Abdominal:     Tenderness: There is no abdominal tenderness. There is no guarding or rebound.  Lymphadenopathy:     Head:     Right side of head: No submandibular, tonsillar or occipital adenopathy.     Left side of head: No submandibular, tonsillar or occipital adenopathy.     Cervical: No cervical adenopathy.  Skin:    Coloration: Skin is not pale.     Findings: No abrasion, erythema, petechiae or rash. Rash is not papular, urticarial or vesicular.  Neurological:     Mental Status: She is alert.  Psychiatric:        Behavior: Behavior is cooperative.      Diagnostic studies: none       Malachi Bonds, MD  [facility]

## 2023-03-22 DIAGNOSIS — J301 Allergic rhinitis due to pollen: Secondary | ICD-10-CM | POA: Diagnosis not present

## 2023-03-22 NOTE — Progress Notes (Signed)
VIALS EXP 03-21-24

## 2023-03-23 DIAGNOSIS — J302 Other seasonal allergic rhinitis: Secondary | ICD-10-CM | POA: Diagnosis not present

## 2023-03-29 MED ORDER — BREZTRI AEROSPHERE 160-9-4.8 MCG/ACT IN AERO: 2.0000 | INHALATION_SPRAY | Freq: Two times a day (BID) | RESPIRATORY_TRACT | 11 refills | Status: AC

## 2023-03-29 NOTE — Addendum Note (Signed)
Addended by: Alfonse Spruce on: 03/29/2023 08:42 AM   Modules accepted: Orders

## 2023-03-30 NOTE — Telephone Encounter (Signed)
[facility]  Markus Daft Script has been printed and signed by Dr. Dellis Anes. I faxed it back to [facility] ([phone])

## 2023-04-05 ENCOUNTER — Ambulatory Visit (INDEPENDENT_AMBULATORY_CARE_PROVIDER_SITE_OTHER): Payer: Medicare HMO | Admitting: Family Medicine

## 2023-04-05 ENCOUNTER — Encounter: Payer: Self-pay | Admitting: Family Medicine

## 2023-04-05 VITALS — BP 152/82 | HR 93 | Temp 97.6°F | Ht 63.0 in | Wt 150.8 lb

## 2023-04-05 DIAGNOSIS — J4489 Other specified chronic obstructive pulmonary disease: Secondary | ICD-10-CM | POA: Diagnosis not present

## 2023-04-05 DIAGNOSIS — J4541 Moderate persistent asthma with (acute) exacerbation: Secondary | ICD-10-CM | POA: Diagnosis not present

## 2023-04-05 DIAGNOSIS — J301 Allergic rhinitis due to pollen: Secondary | ICD-10-CM

## 2023-04-05 MED ORDER — PREDNISONE 10 MG (21) PO TBPK: ORAL_TABLET | ORAL | 0 refills | Status: DC

## 2023-04-05 MED ORDER — AZITHROMYCIN 250 MG PO TABS: ORAL_TABLET | ORAL | 0 refills | Status: DC

## 2023-04-05 MED ORDER — BENZONATATE 200 MG PO CAPS: 200.0000 mg | ORAL_CAPSULE | Freq: Three times a day (TID) | ORAL | 0 refills | Status: DC | PRN

## 2023-04-05 NOTE — Progress Notes (Signed)
Chief Complaint  Patient presents with   Sore Throat   Cough    HPI  Patient presents today for Patient presents with upper respiratory congestion. Rhinorrhea that is frequently purulent. There is moderate sore throat. Took augmentin and prednisone continues to have a dry cough and dry scratchy throat.   Two weeks ago had sputum noted. There is no fever, chills or sweats. The patient reports some shortness of breath. Onset was 3-4 weeks ago.Hx of allergy and asthma.   PMH: Smoking status noted ROS: Per HPI  Objective: BP (!) 152/82   Pulse 93   Temp 97.6 F (36.4 C)   Ht 5\' 3"  (1.6 m)   Wt 150 lb 12.8 oz (68.4 kg)   SpO2 96%   BMI 26.71 kg/m  Gen: NAD, alert, cooperative with exam HEENT: NCAT, Nasal passages swollen, pale CV: RRR, good S1/S2, no murmur Resp: Bronchitis changes, non-labored Ext: No edema, warm Neuro: Alert and oriented, No gross deficits  Assessment and plan:  1. Asthma-COPD overlap syndrome (HCC)   2. Seasonal allergic rhinitis due to pollen   3. Moderate persistent asthmatic bronchitis with acute exacerbation     Meds ordered this encounter  Medications   azithromycin (ZITHROMAX Z-PAK) 250 MG tablet    Sig: Take two right away Then one a day for the next 4 days.    Dispense:  6 each    Refill:  0   benzonatate (TESSALON) 200 MG capsule    Sig: Take 1 capsule (200 mg total) by mouth 3 (three) times daily as needed for cough.    Dispense:  20 capsule    Refill:  0   predniSONE (STERAPRED UNI-PAK 21 TAB) 10 MG (21) TBPK tablet    Sig: Use as package directs    Dispense:  21 tablet    Refill:  0      Follow up as needed.  Mechele Claude, MD

## 2023-04-08 ENCOUNTER — Other Ambulatory Visit: Payer: Self-pay | Admitting: Family Medicine

## 2023-04-08 DIAGNOSIS — Z1212 Encounter for screening for malignant neoplasm of rectum: Secondary | ICD-10-CM

## 2023-04-08 DIAGNOSIS — Z1211 Encounter for screening for malignant neoplasm of colon: Secondary | ICD-10-CM

## 2023-04-21 ENCOUNTER — Ambulatory Visit (INDEPENDENT_AMBULATORY_CARE_PROVIDER_SITE_OTHER): Payer: Medicare HMO | Admitting: *Deleted

## 2023-04-21 DIAGNOSIS — J309 Allergic rhinitis, unspecified: Secondary | ICD-10-CM

## 2023-04-27 ENCOUNTER — Ambulatory Visit (INDEPENDENT_AMBULATORY_CARE_PROVIDER_SITE_OTHER): Payer: Self-pay | Admitting: *Deleted

## 2023-04-27 DIAGNOSIS — J309 Allergic rhinitis, unspecified: Secondary | ICD-10-CM | POA: Diagnosis not present

## 2023-05-04 ENCOUNTER — Ambulatory Visit (INDEPENDENT_AMBULATORY_CARE_PROVIDER_SITE_OTHER): Payer: Medicare HMO | Admitting: *Deleted

## 2023-05-04 DIAGNOSIS — J309 Allergic rhinitis, unspecified: Secondary | ICD-10-CM

## 2023-05-07 ENCOUNTER — Other Ambulatory Visit: Payer: Self-pay | Admitting: Allergy & Immunology

## 2023-05-10 ENCOUNTER — Ambulatory Visit (INDEPENDENT_AMBULATORY_CARE_PROVIDER_SITE_OTHER): Payer: Self-pay | Admitting: *Deleted

## 2023-05-10 DIAGNOSIS — J309 Allergic rhinitis, unspecified: Secondary | ICD-10-CM | POA: Diagnosis not present

## 2023-05-15 ENCOUNTER — Ambulatory Visit: Payer: Medicare HMO

## 2023-05-15 ENCOUNTER — Ambulatory Visit
Admission: EM | Admit: 2023-05-15 | Discharge: 2023-05-15 | Disposition: A | Discharge status: Home / Self Care | Payer: Medicare HMO | Attending: Nurse Practitioner | Admitting: Nurse Practitioner

## 2023-05-15 DIAGNOSIS — M25511 Pain in right shoulder: Secondary | ICD-10-CM | POA: Diagnosis not present

## 2023-05-15 DIAGNOSIS — W19XXXA Unspecified fall, initial encounter: Secondary | ICD-10-CM | POA: Diagnosis not present

## 2023-05-15 DIAGNOSIS — S2231XA Fracture of one rib, right side, initial encounter for closed fracture: Secondary | ICD-10-CM | POA: Diagnosis not present

## 2023-05-15 DIAGNOSIS — M19011 Primary osteoarthritis, right shoulder: Secondary | ICD-10-CM | POA: Diagnosis not present

## 2023-05-15 NOTE — ED Triage Notes (Signed)
Pt reports she fell backwards and landed on her right side hitting the right side of her head, right shoulder, and right thigh x 2 days Had a headache at time of injury. Also has a nodule on right side of head   Denies dizziness, N/V, and confusion

## 2023-05-15 NOTE — Discharge Instructions (Addendum)
X-ray of the right shoulder is negative for fracture or dislocation.  The x-ray does show some mild degenerative changes.   Continue over-the-counter Tylenol and use of Salonpas patches as needed for pain or discomfort. Continue to apply ice to the right shoulder for pain or swelling, you may apply heat for spasm or stiffness. Gentle range of motion exercises to help improve mobility of the right shoulder.  I provided some exercises for you to do at home.  Try to perform the exercises at least twice daily. If you are continuing to experience symptoms, or if symptoms worsen over the next week, it is recommended that you follow-up with orthopedics for further evaluation. Follow-up as needed.
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
## 2023-05-17 ENCOUNTER — Ambulatory Visit (INDEPENDENT_AMBULATORY_CARE_PROVIDER_SITE_OTHER): Payer: Medicare HMO | Admitting: *Deleted

## 2023-05-17 DIAGNOSIS — J309 Allergic rhinitis, unspecified: Secondary | ICD-10-CM | POA: Diagnosis not present

## 2023-05-18 ENCOUNTER — Ambulatory Visit: Payer: Medicare HMO | Admitting: Family Medicine

## 2023-05-18 ENCOUNTER — Encounter: Payer: Self-pay | Admitting: Family Medicine

## 2023-05-18 VITALS — BP 150/74 | HR 88 | Temp 98.5°F | Ht 63.0 in | Wt 151.0 lb

## 2023-05-18 DIAGNOSIS — W19XXXD Unspecified fall, subsequent encounter: Secondary | ICD-10-CM | POA: Diagnosis not present

## 2023-05-18 DIAGNOSIS — S4991XD Unspecified injury of right shoulder and upper arm, subsequent encounter: Secondary | ICD-10-CM

## 2023-05-18 MED ORDER — PREDNISONE 20 MG PO TABS
40.0000 mg | ORAL_TABLET | Freq: Every day | ORAL | 0 refills | Status: AC
Start: 2023-05-18 — End: 2023-05-23

## 2023-05-18 MED ORDER — METHYLPREDNISOLONE ACETATE 40 MG/ML IJ SUSP
40.0000 mg | Freq: Once | INTRAMUSCULAR | Status: AC
Start: 2023-05-18 — End: 2023-05-18
  Administered 2023-05-18: 40 mg via INTRAMUSCULAR

## 2023-05-18 NOTE — Patient Instructions (Addendum)
Monitor BP at home, bring measurements to follow up with PCP  Continue exercises.  Continue alternating tylenol and ibuprofen. Continue alternating heat and ice.  Follow up if symptoms do not improve.
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
## 2023-05-26 ENCOUNTER — Ambulatory Visit (INDEPENDENT_AMBULATORY_CARE_PROVIDER_SITE_OTHER): Payer: Medicare HMO

## 2023-05-26 DIAGNOSIS — J309 Allergic rhinitis, unspecified: Secondary | ICD-10-CM | POA: Diagnosis not present

## 2023-06-01 ENCOUNTER — Ambulatory Visit (INDEPENDENT_AMBULATORY_CARE_PROVIDER_SITE_OTHER): Payer: Medicare HMO

## 2023-06-01 DIAGNOSIS — J309 Allergic rhinitis, unspecified: Secondary | ICD-10-CM

## 2023-06-08 ENCOUNTER — Ambulatory Visit (INDEPENDENT_AMBULATORY_CARE_PROVIDER_SITE_OTHER): Payer: Medicare HMO

## 2023-06-08 DIAGNOSIS — J309 Allergic rhinitis, unspecified: Secondary | ICD-10-CM

## 2023-06-16 ENCOUNTER — Ambulatory Visit: Payer: Self-pay | Admitting: Family Medicine

## 2023-06-16 NOTE — Telephone Encounter (Signed)
  Chief Complaint: Cough Symptoms: intermittent productive cough with clear sputum Frequency: intermittent to constant Pertinent Negatives: Patient denies fever Disposition: [] ED /[] Urgent Care (no appt availability in office) / [x] Appointment(In office/virtual)/ []  [facility]/ [] Home Care/ [] Refused Recommended Disposition /[] [facility]/ []  Follow-up with PCP Additional Notes: patient calling with continued complaints of cough. Pt is very frustrated with speaking with call center. Patient is simply wanting to make an appointment for her cough. Appointment made for 06/17/2023 at 9:50 am. Patient verbalizes understanding of plan. No other questions at this time.    Reason for Disposition  SEVERE coughing spells (e.g., whooping sound after coughing, vomiting after coughing)  Answer Assessment - Initial Assessment Questions 1. ONSET: "When did the cough begin?"      Cough started 2-3 months ago 2. SEVERITY: "How bad is the cough today?"      Patient states "woke up coughing" 3. SPUTUM: "Describe the color of your sputum" (none, dry cough; clear, white, yellow, green)     Clear 4. HEMOPTYSIS: "Are you coughing up any blood?" If so ask: "How much?" (flecks, streaks, tablespoons, etc.)     no 5. DIFFICULTY BREATHING: "Are you having difficulty breathing?" If Yes, ask: "How bad is it?" (e.g., mild, moderate, severe)    - MILD: No SOB at rest, mild SOB with walking, speaks normally in sentences, can lie down, no retractions, pulse < 100.    - MODERATE: SOB at rest, SOB with minimal exertion and prefers to sit, cannot lie down flat, speaks in phrases, mild retractions, audible wheezing, pulse 100-120.    - SEVERE: Very SOB at rest, speaks in single words, struggling to breathe, sitting hunched forward, retractions, pulse > 120      No 6. FEVER: "Do you have a fever?" If Yes, ask: "What is your temperature, how was it measured, and when did it start?"     No 7. CARDIAC  HISTORY: "Do you have any history of heart disease?" (e.g., heart attack, congestive heart failure)      No 8. LUNG HISTORY: "Do you have any history of lung disease?"  (e.g., pulmonary embolus, asthma, emphysema)     NO 9. PE RISK FACTORS: "Do you have a history of blood clots?" (or: recent major surgery, recent prolonged travel, bedridden)     No 10. OTHER SYMPTOMS: "Do you have any other symptoms?" (e.g., runny nose, wheezing, chest pain)       Runny nose from time to time  Protocols used: Cough - Acute Non-Productive-A-AH

## 2023-06-17 ENCOUNTER — Ambulatory Visit (INDEPENDENT_AMBULATORY_CARE_PROVIDER_SITE_OTHER): Payer: Medicare HMO | Admitting: Family Medicine

## 2023-06-17 ENCOUNTER — Ambulatory Visit (INDEPENDENT_AMBULATORY_CARE_PROVIDER_SITE_OTHER): Payer: Medicare HMO

## 2023-06-17 ENCOUNTER — Encounter: Payer: Self-pay | Admitting: Family Medicine

## 2023-06-17 VITALS — BP 140/82 | HR 93 | Temp 98.3°F | Ht 63.0 in | Wt 144.0 lb

## 2023-06-17 DIAGNOSIS — R058 Other specified cough: Secondary | ICD-10-CM

## 2023-06-17 DIAGNOSIS — R059 Cough, unspecified: Secondary | ICD-10-CM | POA: Diagnosis not present

## 2023-06-17 DIAGNOSIS — K219 Gastro-esophageal reflux disease without esophagitis: Secondary | ICD-10-CM | POA: Diagnosis not present

## 2023-06-17 DIAGNOSIS — M40209 Unspecified kyphosis, site unspecified: Secondary | ICD-10-CM | POA: Diagnosis not present

## 2023-06-17 MED ORDER — PANTOPRAZOLE SODIUM 40 MG PO TBEC: 40.0000 mg | DELAYED_RELEASE_TABLET | Freq: Every day | ORAL | 3 refills | Status: DC

## 2023-06-17 NOTE — Progress Notes (Signed)
Subjective: ZO:XWRUE PCP: Raliegh Ip, DO [id number] Misty Clark is a 72 y.o. female presenting to clinic today for:  1. Cough Patient presents because she has had a cough for about 2 months.  She reports that it is occasionally productive but with clear sputum.  She has been compliant with the [facility] as prescribed.  She gets this from a patient assistance program.  She is on multiple medications prescribed by her asthma and allergy specialist.  She also gets an allergy shot monthly.  She is compliant with Pepcid 40 mg.  Continues to have some drainage.   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 For high blood pressure, Disp: 90 tablet, Rfl: 3   atorvastatin (LIPITOR) 40 MG tablet, Take 1 tablet (40 mg total) by mouth daily., Disp: 90 tablet, Rfl: 3   azithromycin (ZITHROMAX Z-PAK) 250 MG tablet, Take two right away Then one a day for the next 4 days., Disp: 6 each, Rfl: 0   benzonatate (TESSALON) 200 MG capsule, Take 1 capsule (200 mg total) by mouth 3 (three) times daily as needed for cough., Disp: 20 capsule, Rfl: 0   citalopram (CELEXA) 20 MG tablet, TAKE 1 TABLET BY MOUTH ONCE DAILY ., Disp: 90  tablet, Rfl: 3   EPINEPHrine 0.3 mg/0.3 mL IJ SOAJ injection, Inject 0.3 mg into the muscle as needed for anaphylaxis. As needed for life-threatening allergic reactions, Disp: 2 each, Rfl: 1   famotidine (PEPCID) 40 MG tablet, Take 1 tablet by mouth once daily, Disp: 90 tablet, Rfl: 0   fluticasone (FLONASE) 50 MCG/ACT nasal spray, Place 2 sprays into both nostrils daily., Disp: 16 g, Rfl: 6   hydrochlorothiazide (HYDRODIURIL) 25 MG tablet, Take 0.5-1 tablets (12.5-25 mg total) by mouth daily as needed (leg swelling)., Disp: 90 tablet, Rfl: 3   ipratropium (ATROVENT) 0.06 % nasal spray, Place 1 spray into both nostrils 3 (three) times daily. every 8 hours as needed to help with drying out your nose, Disp: 45 mL, Rfl: 3   levocetirizine (XYZAL) 5 MG tablet, Take 1 tablet (5 mg total) by mouth every evening., Disp: 90 tablet, Rfl: 3   meclizine (ANTIVERT) 12.5 MG tablet, Take 1 tablet (12.5 mg total) by mouth 3 (three) times daily as needed for dizziness., Disp: 30 tablet, Rfl: 1   montelukast (SINGULAIR) 10 MG tablet, Take 1 tablet (10 mg total) by mouth at bedtime., Disp: 90 tablet, Rfl: 3   Vitamin D, Ergocalciferol, (DRISDOL) 1.25 MG (50000 UNIT) CAPS capsule, Take 1 capsule (50,000 Units total) by mouth every 7 (  seven) days., Disp: 12 capsule, Rfl: 3 Social History   Socioeconomic History   Marital status: Widowed    Spouse name: Not on file   Number of children: 4   Years of education: Not on file   Highest education level: Some college, no degree  Occupational History   Occupation: Retired Nursing    Comment: [employer] [facility]/ HH  Tobacco Use   Smoking status: Former    Current packs/day: 0.50    Average packs/day: 0.5 packs/day for 3.0 years (1.5 ttl pk-yrs)    Types: Cigarettes   Smokeless tobacco: Never   Tobacco comments:    Quit many years ago.  Vaping Use   Vaping status: Never Used  Substance and Sexual Activity   Alcohol use: No   Drug use: Never   Sexual activity: Not on  file  Other Topics Concern   Not on file  Social History Narrative   Retired Arts administrator. 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 Lives alone. Enjoys crafting, puzzles, reading, and gardening.    Social Drivers of Corporate investment banker Strain: Low Risk  (09/29/2022)   Overall Financial Resource Strain (CARDIA)    Difficulty of Paying Living Expenses: Not hard at all  Food Insecurity: No Food Insecurity (09/29/2022)   Hunger Vital Sign    Worried About Running Out of Food in the Last Year: Never true    Ran Out of Food in the Last Year: Never true  Transportation Needs: No Transportation Needs (09/29/2022)   PRAPARE - Administrator, Civil Service (Medical): No    Lack of Transportation (Non-Medical): No  Physical Activity: Sufficiently Active (09/29/2022)   Exercise Vital Sign    Days of Exercise per Week: 5 days    Minutes of Exercise per Session: 30 min  Stress: No Stress Concern Present (09/29/2022)   [employer] of Occupational Health - Occupational Stress Questionnaire    Feeling of Stress : Not at all  Social Connections: Moderately Integrated (09/29/2022)   Social Connection and Isolation Panel [NHANES]    Frequency of Communication with Friends and Family: More than three times a week    Frequency of Social Gatherings with Friends and Family: More than three times a week    Attends Religious Services: More than 4 times per year    Active Member of [employer] or Organizations: Yes    Attends Banker Meetings: More than 4 times per year    Marital Status: Widowed  Intimate Partner Violence: Not At Risk (09/29/2022)   Humiliation, Afraid, Rape, and Kick questionnaire    Fear of Current or Ex-Partner: No    Emotionally Abused: No    Physically Abused: No    Sexually Abused: No   Family History  Problem Relation Age of Onset   Asthma Mother    Thyroid disease Mother    Heart disease Father    Breast cancer Sister    Hypertension Daughter     Heart failure Maternal Grandmother        CHF   Heart attack Paternal Grandfather        MI   Coronary artery disease Paternal Grandfather        At a premature age   Coronary artery disease Other    Colon cancer Other    Prostate cancer Other    Ovarian cancer Other    Allergic rhinitis Neg Hx    Angioedema Neg Hx    Atopy Neg Hx  Eczema Neg Hx    Immunodeficiency Neg Hx    Urticaria Neg Hx     Objective: Office vital signs reviewed. BP (!) 140/82   Pulse 93   Temp 98.3 F (36.8 C) (Temporal)   Ht 5\' 3"  (1.6 m)   Wt 144 lb (65.3 kg)   SpO2 94%   BMI 25.51 kg/m   Physical Examination:  General: Awake, alert, well nourished, No acute distress HEENT: sclera white, MMM, no rhinorrhea Cardio: regular rate and rhythm, S1S2 heard, no murmurs appreciated Pulm: clear to auscultation bilaterally, no wheezes, rhonchi or rales; normal work of breathing on room air  DG Chest 2 View Result Date: 06/17/2023 CLINICAL DATA:  Cough for 3 weeks. EXAM: CHEST - 2 VIEW COMPARISON:  04/26/2018. FINDINGS: Bilateral lung fields are clear. Bilateral costophrenic angles are clear. Normal cardio-mediastinal silhouette. No acute osseous abnormalities. Redemonstration of old healed fractures of right middle ribs along the lateral aspect. There is exaggerated kyphosis, similar to the prior study. The soft tissues are within normal limits. There are surgical clips in the right upper quadrant, typical of a previous cholecystectomy. IMPRESSION: No active cardiopulmonary disease. Electronically Signed   By: Jules Schick M.D.   On: 06/17/2023 10:21    Assessment/ Plan: 72 y.o. female   Cough present for greater than 3 weeks - Plan: DG Chest 2 View, pantoprazole (PROTONIX) 40 MG tablet  Gastroesophageal reflux disease without esophagitis - Plan: pantoprazole (PROTONIX) 40 MG tablet  I am going to trial her on a PPI for the next couple of weeks.  We have close follow-up at the end of the month.   Her chest x-ray demonstrated no acute pulmonary infiltrates upon my personal review and this was confirmed by radiology review today.  If no significant improvement, could consider proceeding with CT scan of the lungs.   Raliegh Ip, DO [facility] [phone]

## 2023-06-21 ENCOUNTER — Ambulatory Visit (INDEPENDENT_AMBULATORY_CARE_PROVIDER_SITE_OTHER): Payer: Self-pay | Admitting: *Deleted

## 2023-06-21 DIAGNOSIS — J309 Allergic rhinitis, unspecified: Secondary | ICD-10-CM

## 2023-07-04 ENCOUNTER — Ambulatory Visit: Payer: Medicare HMO | Admitting: Family Medicine

## 2023-07-04 ENCOUNTER — Encounter: Payer: Self-pay | Admitting: Family Medicine

## 2023-07-04 VITALS — BP 142/74 | HR 83 | Temp 98.7°F | Ht 63.0 in | Wt 149.0 lb

## 2023-07-04 DIAGNOSIS — I10 Essential (primary) hypertension: Secondary | ICD-10-CM

## 2023-07-04 DIAGNOSIS — E782 Mixed hyperlipidemia: Secondary | ICD-10-CM

## 2023-07-04 DIAGNOSIS — Z79899 Other long term (current) drug therapy: Secondary | ICD-10-CM | POA: Diagnosis not present

## 2023-07-04 DIAGNOSIS — F411 Generalized anxiety disorder: Secondary | ICD-10-CM

## 2023-07-04 DIAGNOSIS — E559 Vitamin D deficiency, unspecified: Secondary | ICD-10-CM

## 2023-07-04 DIAGNOSIS — R42 Dizziness and giddiness: Secondary | ICD-10-CM | POA: Diagnosis not present

## 2023-07-04 DIAGNOSIS — J302 Other seasonal allergic rhinitis: Secondary | ICD-10-CM | POA: Diagnosis not present

## 2023-07-04 DIAGNOSIS — R6889 Other general symptoms and signs: Secondary | ICD-10-CM | POA: Diagnosis not present

## 2023-07-04 MED ORDER — ATORVASTATIN CALCIUM 40 MG PO TABS: 40.0000 mg | ORAL_TABLET | Freq: Every day | ORAL | 3 refills | Status: DC

## 2023-07-04 MED ORDER — MONTELUKAST SODIUM 10 MG PO TABS: 10.0000 mg | ORAL_TABLET | Freq: Every day | ORAL | 3 refills | Status: DC

## 2023-07-04 MED ORDER — CITALOPRAM HYDROBROMIDE 20 MG PO TABS: ORAL_TABLET | ORAL | 3 refills | Status: DC

## 2023-07-04 MED ORDER — LEVOCETIRIZINE DIHYDROCHLORIDE 5 MG PO TABS: 5.0000 mg | ORAL_TABLET | Freq: Every evening | ORAL | 3 refills | Status: DC

## 2023-07-04 MED ORDER — FLUTICASONE PROPIONATE 50 MCG/ACT NA SUSP: 2.0000 | Freq: Every day | NASAL | 6 refills | Status: AC

## 2023-07-04 MED ORDER — AMLODIPINE BESYLATE 2.5 MG PO TABS: 2.5000 mg | ORAL_TABLET | Freq: Every day | ORAL | 3 refills | Status: DC

## 2023-07-04 MED ORDER — MECLIZINE HCL 12.5 MG PO TABS: 12.5000 mg | ORAL_TABLET | Freq: Three times a day (TID) | ORAL | 1 refills | Status: AC | PRN

## 2023-07-04 MED ORDER — EPINEPHRINE 0.3 MG/0.3ML IJ SOAJ: 0.3000 mg | INTRAMUSCULAR | 1 refills | Status: AC | PRN

## 2023-07-04 MED ORDER — ALBUTEROL SULFATE HFA 108 (90 BASE) MCG/ACT IN AERS: INHALATION_SPRAY | RESPIRATORY_TRACT | 0 refills | Status: DC

## 2023-07-04 MED ORDER — ALPRAZOLAM 0.5 MG PO TABS: 0.5000 mg | ORAL_TABLET | Freq: Every day | ORAL | 1 refills | Status: DC | PRN

## 2023-07-04 NOTE — Progress Notes (Signed)
Subjective: CC: Chronic follow-up PCP: Raliegh Ip, DO UJW:[id number] Misty Clark is a 72 y.o. female presenting to clinic today for:  1.  Anxiety disorder Patient continues to use the alprazolam very sparingly.  Has not had a refill since this summer.  No excessive daytime sedation, falls or respiratory depression since November.  This was not associated with use of benzodiazepine.  2.  Cough Patient was trialed on PPI.  Chest x-ray was negative.  Continues to use Breztri but needs refills on albuterol.  Also needs renewal of EpiPen.  3.  Hypertension Compliant with Norvasc 2.5 mg daily.  On average home blood pressures are running around 140 systolic.  She reports no chest pain.  She reports shortness of breath is chronic and stable.  Will need a handicap placard though because she gets winded with prolonged walking.  4.  Back pain Continues to have some mild left-sided more than right sided back pain in the midthoracic area that seems to be exertional.  She again had a fall back in November so is not sure if this is related or not.   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 As needed for life-threatening allergic reactions, Disp: 2 each, Rfl: 1   famotidine (PEPCID) 40 MG tablet, Take 1 tablet by mouth once daily, Disp: 90 tablet, Rfl: 0    hydrochlorothiazide (HYDRODIURIL) 25 MG tablet, Take 0.5-1 tablets (12.5-25 mg total) by mouth daily as needed (leg swelling)., Disp: 90 tablet, Rfl: 3   ipratropium (ATROVENT) 0.06 % nasal spray, Place 1 spray into both nostrils 3 (three) times daily. every 8 hours as needed to help with drying out your nose, Disp: 45 mL, Rfl: 3   pantoprazole (PROTONIX) 40 MG tablet, Take 1 tablet (40 mg total) by mouth daily., Disp: 30 tablet, Rfl: 3   Vitamin D, Ergocalciferol, (DRISDOL) 1.25 MG (50000 UNIT) CAPS capsule, Take 1 capsule (50,000 Units total) by mouth every 7 (seven) days., Disp: 12 capsule, Rfl: 3   albuterol (VENTOLIN HFA) 108 (90 Base) MCG/ACT inhaler, INHALE 2 PUFFS BY MOUTH EVERY 6 HOURS AS NEEDED FOR WHEEZING AND FOR SHORTNESS OF BREATH, Disp: 18 g, Rfl: 0   ALPRAZolam (XANAX) 0.5 MG tablet, Take 1 tablet (0.5 mg total) by mouth daily as needed for anxiety., Disp: 20 tablet, Rfl: 1   amLODipine (NORVASC) 2.5 MG tablet, Take 1 tablet (2.5 mg total) by mouth daily. For high blood pressure, Disp: 90 tablet, Rfl: 3   atorvastatin (LIPITOR) 40 MG tablet, Take 1 tablet (40 mg total) by mouth daily., Disp: 90 tablet, Rfl: 3   citalopram (CELEXA) 20 MG tablet, TAKE 1 TABLET BY MOUTH ONCE DAILY ., Disp: 90 tablet, Rfl: 3   fluticasone (FLONASE) 50 MCG/ACT nasal spray, Place 2 sprays into  both nostrils daily., Disp: 16 g, Rfl: 6   levocetirizine (XYZAL) 5 MG tablet, Take 1 tablet (5 mg total) by mouth every evening., Disp: 90 tablet, Rfl: 3   meclizine (ANTIVERT) 12.5 MG tablet, Take 1 tablet (12.5 mg total) by mouth 3 (three) times daily as needed for dizziness., Disp: 30 tablet, Rfl: 1   montelukast (SINGULAIR) 10 MG tablet, Take 1 tablet (10 mg total) by mouth at bedtime., Disp: 90 tablet, Rfl: 3 Social History   Socioeconomic History   Marital status: Widowed    Spouse name: Not on file   Number of children: 4   Years of education: Not on file   Highest education level: Some college, no degree   Occupational History   Occupation: Retired Nursing    Comment: [employer] [facility]/ HH  Tobacco Use   Smoking status: Former    Current packs/day: 0.50    Average packs/day: 0.5 packs/day for 3.0 years (1.5 ttl pk-yrs)    Types: Cigarettes   Smokeless tobacco: Never   Tobacco comments:    Quit many years ago.  Vaping Use   Vaping status: Never Used  Substance and Sexual Activity   Alcohol use: No   Drug use: Never   Sexual activity: Not on file  Other Topics Concern   Not on file  Social History Narrative   Retired Arts administrator. 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 Lives alone. Enjoys crafting, puzzles, reading, and gardening.    Social Drivers of Corporate investment banker Strain: Low Risk  (09/29/2022)   Overall Financial Resource Strain (CARDIA)    Difficulty of Paying Living Expenses: Not hard at all  Food Insecurity: No Food Insecurity (09/29/2022)   Hunger Vital Sign    Worried About Running Out of Food in the Last Year: Never true    Ran Out of Food in the Last Year: Never true  Transportation Needs: No Transportation Needs (09/29/2022)   PRAPARE - Administrator, Civil Service (Medical): No    Lack of Transportation (Non-Medical): No  Physical Activity: Sufficiently Active (09/29/2022)   Exercise Vital Sign    Days of Exercise per Week: 5 days    Minutes of Exercise per Session: 30 min  Stress: No Stress Concern Present (09/29/2022)   Misty Clark of Occupational Health - Occupational Stress Questionnaire    Feeling of Stress : Not at all  Social Connections: Moderately Integrated (09/29/2022)   Social Connection and Isolation Panel [NHANES]    Frequency of Communication with Friends and Family: More than three times a week    Frequency of Social Gatherings with Friends and Family: More than three times a week    Attends Religious Services: More than 4 times per year    Active Member of [employer] or Organizations: Yes    Attends Banker  Meetings: More than 4 times per year    Marital Status: Widowed  Intimate Partner Violence: Not At Risk (09/29/2022)   Humiliation, Afraid, Rape, and Kick questionnaire    Fear of Current or Ex-Partner: No    Emotionally Abused: No    Physically Abused: No    Sexually Abused: No   Family History  Problem Relation Age of Onset   Asthma Mother    Thyroid disease Mother    Heart disease Father    Breast cancer Sister    Hypertension Daughter    Heart failure Maternal Grandmother        CHF   Heart attack  Paternal Grandfather        MI   Coronary artery disease Paternal Grandfather        At a premature age   Coronary artery disease Other    Colon cancer Other    Prostate cancer Other    Ovarian cancer Other    Allergic rhinitis Neg Hx    Angioedema Neg Hx    Atopy Neg Hx    Eczema Neg Hx    Immunodeficiency Neg Hx    Urticaria Neg Hx     Objective: Office vital signs reviewed. BP (!) 142/74 Comment: home BP  Pulse 83   Temp 98.7 F (37.1 C)   Ht 5\' 3"  (1.6 m)   Wt 149 lb (67.6 kg)   SpO2 99%   BMI 26.39 kg/m   Physical Examination:  General: Awake, alert, well nourished, No acute distress HEENT: sclera white, MMM Cardio: regular rate and rhythm, S1S2 heard, no murmurs appreciated Pulm: Globally decreased breath sounds but otherwise clear to auscultation bilaterally, no wheezes, rhonchi or rales; normal work of breathing on room air MSK: Increased kyphosis of the thoracic spine with prominent left-sided rib hump.  She has no midline tenderness palpation.  Mild paraspinal muscle tenderness palpation bilaterally.  No palpable bony abnormalities.     06/17/2023    9:59 AM 12/29/2022   11:15 AM 09/29/2022   10:28 AM  Depression screen PHQ 2/9  Decreased Interest 1 1 0  Down, Depressed, Hopeless 1 1 0  PHQ - 2 Score 2 2 0  Altered sleeping 1 1 0  Tired, decreased energy 0 0 0  Change in appetite 0 0 0  Feeling bad or failure about yourself  0 1 0  Trouble  concentrating 0 1 0  Moving slowly or fidgety/restless 0 0 0  Suicidal thoughts 0 0 0  PHQ-9 Score 3 5 0  Difficult doing work/chores  Not difficult at all Not difficult at all      06/17/2023    9:55 AM 05/18/2023   10:03 AM 12/29/2022   11:16 AM 09/10/2022   10:10 AM  GAD 7 : Generalized Anxiety Score  Nervous, Anxious, on Edge 0 0 1 0  Control/stop worrying 0 0 1 0  Worry too much - different things 0 0 1 0  Trouble relaxing 0 0 1 0  Restless 0 0 0 0  Easily annoyed or irritable 0 0 0 0  Afraid - awful might happen 0 0 0 0  Total GAD 7 Score 0 0 4 0  Anxiety Difficulty  Not difficult at all Not difficult at all Not difficult at all    Assessment/ Plan: 72 y.o. female   Generalized anxiety disorder - Plan: ToxASSURE Select 13 (MW), Urine, ALPRAZolam (XANAX) 0.5 MG tablet, citalopram (CELEXA) 20 MG tablet  Controlled substance agreement signed - Plan: ToxASSURE Select 13 (MW), Urine  Essential hypertension, benign - Plan: amLODipine (NORVASC) 2.5 MG tablet  Mixed hyperlipidemia - Plan: atorvastatin (LIPITOR) 40 MG tablet, CMP14+EGFR  Seasonal allergic rhinitis, unspecified trigger - Plan: montelukast (SINGULAIR) 10 MG tablet, levocetirizine (XYZAL) 5 MG tablet  Vertigo - Plan: meclizine (ANTIVERT) 12.5 MG tablet  Vitamin D deficiency - Plan: VITAMIN D 25 Hydroxy (Vit-D Deficiency, Fractures)  Cold sensitivity - Plan: TSH  Anxiety disorder is chronic and stable.  UDS and CSC were updated as per office policy.  She will continue to use this medication extremely sparingly.  If we see recurrent falls I be inclined to totally  discontinue to reduce risk the patient.  Her blood pressure is borderline today.  However, home blood pressures were appropriate she will continue at Norvasc 2.5 mg for now.  If elevating at home into the 150s we will need to advance a 5 mg  I have renewed her allergy medicine but we did not discuss her allergies at length today.  Her lipid panel is not  due.  Renewal of statin sent  Requested refill of meclizine.  Use sparingly  Check vitamin D and TSH levels given reports of cold sensitivity   Xyler Terpening Hulen Skains, DO [facility] [phone]

## 2023-07-05 LAB — CMP14+EGFR
ALT: 12 [IU]/L (ref 0–32)
AST: 16 [IU]/L (ref 0–40)
Albumin: 4.4 g/dL (ref 3.8–4.8)
Alkaline Phosphatase: 74 [IU]/L (ref 44–121)
BUN/Creatinine Ratio: 13 (ref 12–28)
BUN: 7 mg/dL — ABNORMAL LOW (ref 8–27)
Bilirubin Total: 0.5 mg/dL (ref 0.0–1.2)
CO2: 25 mmol/L (ref 20–29)
Calcium: 9.3 mg/dL (ref 8.7–10.3)
Chloride: 93 mmol/L — ABNORMAL LOW (ref 96–106)
Creatinine, Ser: 0.56 mg/dL — ABNORMAL LOW (ref 0.57–1.00)
Globulin, Total: 1.7 g/dL (ref 1.5–4.5)
Glucose: 96 mg/dL (ref 70–99)
Potassium: 4.2 mmol/L (ref 3.5–5.2)
Sodium: 131 mmol/L — ABNORMAL LOW (ref 134–144)
Total Protein: 6.1 g/dL (ref 6.0–8.5)
eGFR: 96 mL/min/{1.73_m2} (ref >59)

## 2023-07-05 LAB — VITAMIN D 25 HYDROXY (VIT D DEFICIENCY, FRACTURES): Vit D, 25-Hydroxy: 48.3 ng/mL (ref 30.0–100.0)

## 2023-07-05 LAB — TSH: TSH: 2.41 u[IU]/mL (ref 0.450–4.500)

## 2023-07-06 ENCOUNTER — Other Ambulatory Visit: Payer: Self-pay | Admitting: *Deleted

## 2023-07-06 LAB — TOXASSURE SELECT 13 (MW), URINE

## 2023-07-06 MED ORDER — TEZSPIRE 210 MG/1.91ML ~~LOC~~ SOAJ: 210.0000 mg | SUBCUTANEOUS | 11 refills | Status: DC

## 2023-07-08 ENCOUNTER — Ambulatory Visit (INDEPENDENT_AMBULATORY_CARE_PROVIDER_SITE_OTHER): Payer: Medicare HMO

## 2023-07-08 DIAGNOSIS — J309 Allergic rhinitis, unspecified: Secondary | ICD-10-CM | POA: Diagnosis not present

## 2023-08-03 ENCOUNTER — Ambulatory Visit (INDEPENDENT_AMBULATORY_CARE_PROVIDER_SITE_OTHER): Payer: Self-pay

## 2023-08-03 DIAGNOSIS — J309 Allergic rhinitis, unspecified: Secondary | ICD-10-CM | POA: Diagnosis not present

## 2023-08-05 ENCOUNTER — Other Ambulatory Visit: Payer: Self-pay | Admitting: Allergy & Immunology

## 2023-08-08 NOTE — Patient Instructions (Addendum)
 1. Chronic rhinitis (grasses, trees and indoor molds) - Continue with allergy injections per protocol.  - Continue with Zyrtec (cetirizine) 10mg  tablet once daily and Singulair  (montelukast ) 10mg  daily and Flonase  (fluticasone ) one spray per nostril daily  - Continue with nasal ipratropium every 8 hours as needed to help with drying out your nose.  - Continue with nasal saline rinses 1-2 times daily to remove allergens from the nasal cavities as well as help with mucous clearance (this is especially helpful to do before the nasal sprays are given)   2. Mild persistent asthma, uncomplicated - Lung testing looks stable. - I am going to get some labs to check on your alpha-1 antitrypsin level (we checked this 3 years ago when we first met, but I want to see where it is heading now). - I talked to Tammy and she is going to check on how things are going with your Tezspire . - We will FIGHT, I promise!  - Be sure to fax a copy of the form to Tammy [phone]). - Daily controller medication(s): Breztri  two puffs twice daily + Singulair  (montelukast ) 10mg  + Tezspire  monthly - Prior to physical activity: albuterol  2 puffs 10-15 minutes before physical activity. - Rescue medications: albuterol  4 puffs every 4-6 hours as needed - Asthma control goals:  * Full participation in all desired activities (may need albuterol  before activity) * Albuterol  use two time or less a week on average (not counting use with activity) * Cough interfering with sleep two time or less a month * Oral steroids no more than once a year * No hospitalizations.  3. Return in about 6 months (around 02/06/2024). You can have the follow up appointment with Dr. Iva only.    Please inform us  of any Emergency Department visits, hospitalizations, or changes in symptoms. Call us  before going to the ED for breathing or allergy symptoms since we might be able to fit you in for a sick visit. Feel free to contact us  anytime with any  questions, problems, or concerns.  It was a pleasure to see you again today!  Websites that have reliable patient information: 1. American Academy of Asthma, Allergy, and Immunology: www.aaaai.org 2. Food Allergy Research and Education (FARE): foodallergy.org 3. Mothers of Asthmatics: http://www.asthmacommunitynetwork.org 4. American College of Allergy, Asthma, and Immunology: www.acaai.org      "Like" us  on Facebook and Instagram for our latest updates!      A healthy democracy works best when [employer] participate! Make sure you are registered to vote! If you have moved or changed any of your contact information, you will need to get this updated before voting! Scan the QR codes below to learn more!

## 2023-08-09 ENCOUNTER — Encounter: Payer: Self-pay | Admitting: Allergy & Immunology

## 2023-08-09 ENCOUNTER — Ambulatory Visit (INDEPENDENT_AMBULATORY_CARE_PROVIDER_SITE_OTHER): Payer: Medicare HMO | Admitting: Family

## 2023-08-09 VITALS — BP 140/86 | HR 82 | Temp 98.2°F | Resp 18

## 2023-08-09 DIAGNOSIS — J4550 Severe persistent asthma, uncomplicated: Secondary | ICD-10-CM | POA: Diagnosis not present

## 2023-08-09 DIAGNOSIS — J3089 Other allergic rhinitis: Secondary | ICD-10-CM

## 2023-08-09 DIAGNOSIS — J984 Other disorders of lung: Secondary | ICD-10-CM

## 2023-08-09 DIAGNOSIS — K219 Gastro-esophageal reflux disease without esophagitis: Secondary | ICD-10-CM

## 2023-08-09 DIAGNOSIS — J302 Other seasonal allergic rhinitis: Secondary | ICD-10-CM | POA: Diagnosis not present

## 2023-08-09 MED ORDER — FAMOTIDINE 40 MG PO TABS: 40.0000 mg | ORAL_TABLET | Freq: Every day | ORAL | 1 refills | Status: DC

## 2023-08-09 NOTE — Progress Notes (Signed)
 FOLLOW UP  Date of Service/Encounter:  08/09/23   Assessment:   Asthma COPD overlap syndrome - with remote history of smoking for 3 to 4 years back in high school (planning to add Tezspire  to help with symptoms)   Persistent restrictive lung disease - not responsive to prednisone    Seasonal and perennial allergic rhinitis (grasses, trees and indoor molds) - on allergen immunotherapy with maintenance reached in August 2024   Trouble affording medications - improved since [place] the [place] and [place]    Plan/Recommendations:   1. Chronic rhinitis (grasses, trees and indoor molds) - Continue with allergy injections per protocol.  - Continue with Zyrtec (cetirizine) 10mg  tablet once daily and Singulair  (montelukast ) 10mg  daily and Flonase  (fluticasone ) one spray per nostril daily  - Continue with nasal ipratropium every 8 hours as needed to help with drying out your nose.  - Continue with nasal saline rinses 1-2 times daily to remove allergens from the nasal cavities as well as help with mucous clearance (this is especially helpful to do before the nasal sprays are given)   2. Mild persistent asthma, uncomplicated - Lung testing looks stable. - I am going to get some labs to check on your alpha-1 antitrypsin level (we checked this 3 years ago when we first met, but I want to see where it is heading now). - I talked to Tammy and she is going to check on how things are going with your Tezspire . - We will FIGHT, I promise!  - Be sure to fax a copy of the form to Tammy [phone]). - Daily controller medication(s): Breztri  two puffs twice daily + Singulair  (montelukast ) 10mg  + Tezspire  monthly - Prior to physical activity: albuterol  2 puffs 10-15 minutes before physical activity. - Rescue medications: albuterol  4 puffs every 4-6 hours as needed - Asthma control goals:  * Full participation in all desired activities (may need albuterol  before activity) * Albuterol  use two time or less a  week on average (not counting use with activity) * Cough interfering with sleep two time or less a month * Oral steroids no more than once a year * No hospitalizations.  3. Return in about 6 months (around 02/06/2024). You can have the follow up appointment with Dr. Iva only.    Subjective:   Misty Clark is a 73 y.o. 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  Discussed the use of AI scribe software for clinical note transcription with the patient and/or guardian, who gave verbal consent to proceed.  Misty Clark is a 73 y.o. female presenting for a follow up visit.  She was last seen in May 2024.  At that time, she was doing well on her allergy shots.  We continue with Zyrtec as well as nasal ipratropium and saline as needed.  She also remained on Singulair  and Flonase .  Her lung testing looks  stable.  Will continue with Breztri  2 puffs twice daily and Singulair  10 mg daily.  She also remained on Tezspire  monthly.  Since last visit, she has largely done very well.  Asthma/Respiratory Symptom History: She has a history of asthma and has been receiving Tezspire  injections, which have significantly improved her breathing. However, she missed her asthma shot in January due to issues with the prescription process, leading to a noticeable decline  in her breathing. Her family has also observed that she does better when on the medication. She has not had a breathing test recently and becomes short of breath more easily since missing the shot.  Unfortunately, she got a notification from insurance company that this was again be covered.  She has filled out a form a couple of times to try to reapply for it.  She mailed it back originally when she got it in January, but never heard anything.  I confirmed from our Biologics Coordinator - Tammy - that she had not received a copy of it either.  She has another copy that she is going to fax directly to the company.  She will fax a copy to [facility] as well. She has not been on prednisone  recently for her breathing issues. She recalls a previous episode where she was not fully recovered from a respiratory issue and required a follow-up with her doctor, who switched her antibiotic, leading to improvement. She describes this past illness as a viral type of infection.  Allergic Rhinitis Symptom History: She experiences intermittent dizziness, which occurs sporadically without a clear pattern. She is uncertain if it is related to her ears.  Otherwise, allergy shots are going well.  She is not having any bad reactions to them.  Misty Clark is on allergen immunotherapy. She receives two injections. Immunotherapy script #1 contains molds. She currently receives 0.50mL of the RED vial (1/100). Immunotherapy script #2 contains trees and grasses. She currently receives 0.50mL of the RED vial (1/100). She started shots November of 2023 and reached maintenance in August 2024.  She has not had any large local reactions.  She is on Xanax  and is required to undergo urine tests due to this medication.   Otherwise, there have been no changes to her past medical history, surgical history, family history, or social history.    Review of systems otherwise negative other than that mentioned in the HPI.    Objective:   Blood  pressure (!) 140/86, pulse 82, temperature 98.2 F (36.8 C), temperature source Temporal, resp. rate 18, SpO2 97%. There is no height or weight on file to calculate BMI.    Physical Exam Vitals reviewed.  Constitutional:      Appearance: She is well-developed.     Comments: Speaking in full sentences.  Very talkative.  Not ill-appearing.  HENT:     Head: Normocephalic and atraumatic.     Right Ear: Tympanic membrane, ear canal and external ear normal. No drainage, swelling or tenderness. Tympanic membrane is not injected, scarred, erythematous, retracted or bulging.     Left Ear: Tympanic membrane, ear canal and external ear normal. No drainage, swelling or tenderness. Tympanic membrane is not injected, scarred, erythematous, retracted or bulging.     Nose: No nasal deformity, septal deviation, mucosal edema or rhinorrhea.     Right Turbinates: Enlarged, swollen and pale.     Left Turbinates: Enlarged, swollen and pale.     Right Sinus: No maxillary sinus tenderness or frontal sinus tenderness.     Left  Sinus: No maxillary sinus tenderness or frontal sinus tenderness.     Mouth/Throat:     Mouth: Mucous membranes are not pale and not dry.     Pharynx: Uvula midline.  Eyes:     General: Lids are normal.        Right eye: No discharge.        Left eye: No discharge.     Conjunctiva/sclera: Conjunctivae normal.     Right eye: Right conjunctiva is not injected. No chemosis.    Left eye: Left conjunctiva is not injected. No chemosis.    Pupils: Pupils are equal, round, and reactive to light.  Cardiovascular:     Rate and Rhythm: Normal rate and regular rhythm.     Heart sounds: Normal heart sounds.  Pulmonary:     Effort: Pulmonary effort is normal. No tachypnea, accessory muscle usage or respiratory distress.     Breath sounds: Normal breath sounds. No wheezing, rhonchi or rales.     Comments: Moving air well in all lung fields. No increased work of breathing noted.  Chest:      Chest wall: No tenderness.  Abdominal:     Tenderness: There is no abdominal tenderness. There is no guarding or rebound.  Lymphadenopathy:     Head:     Right side of head: No submandibular, tonsillar or occipital adenopathy.     Left side of head: No submandibular, tonsillar or occipital adenopathy.     Cervical: No cervical adenopathy.  Skin:    Coloration: Skin is not pale.     Findings: No abrasion, erythema, petechiae or rash. Rash is not papular, urticarial or vesicular.  Neurological:     Mental Status: She is alert.  Psychiatric:        Behavior: Behavior is cooperative.      Diagnostic studies:    Spirometry: results abnormal (FEV1: 0.89/43%, FVC: 1.67/63%, FEV1/FVC: 53%).    Spirometry consistent with mixed obstructive and restrictive disease.   Allergy Studies: none      Marty Shaggy, MD  [facility] 

## 2023-08-11 ENCOUNTER — Encounter: Payer: Self-pay | Admitting: Allergy & Immunology

## 2023-08-12 ENCOUNTER — Telehealth: Payer: Self-pay | Admitting: Allergy & Immunology

## 2023-08-12 NOTE — Telephone Encounter (Signed)
 Patient called and stated that she is unable to use her mychart and can  not get her test results from there. Patient stated  that she would like a call back as soon as possible to go over her results. I let patient know that a nurse did call her yesterday to go over her results and left a message regarding her lab results. Patient stated that someone did call but only said to call back office to go over results.patient call back number is [phone]

## 2023-08-12 NOTE — Telephone Encounter (Signed)
Pt has been informed of lab results. 

## 2023-08-12 NOTE — Telephone Encounter (Signed)
Patient returned call for lab results.  

## 2023-08-12 NOTE — Telephone Encounter (Signed)
Lm for pt to call us back about lab results

## 2023-08-18 ENCOUNTER — Ambulatory Visit (INDEPENDENT_AMBULATORY_CARE_PROVIDER_SITE_OTHER): Payer: Self-pay

## 2023-08-18 DIAGNOSIS — J309 Allergic rhinitis, unspecified: Secondary | ICD-10-CM

## 2023-08-19 LAB — ALPHA-1-ANTITRYPSIN: A-1 Antitrypsin: 148 mg/dL (ref 101–187)

## 2023-08-19 LAB — ALPHA-1-ANTITRYPSIN DEFICIENCY

## 2023-08-20 ENCOUNTER — Other Ambulatory Visit: Payer: Self-pay | Admitting: Family Medicine

## 2023-08-29 ENCOUNTER — Ambulatory Visit (INDEPENDENT_AMBULATORY_CARE_PROVIDER_SITE_OTHER): Payer: Self-pay | Admitting: *Deleted

## 2023-08-29 DIAGNOSIS — J309 Allergic rhinitis, unspecified: Secondary | ICD-10-CM

## 2023-09-01 ENCOUNTER — Other Ambulatory Visit: Payer: Self-pay | Admitting: *Deleted

## 2023-09-01 MED ORDER — TEZSPIRE 210 MG/1.91ML ~~LOC~~ SOAJ: 210.0000 mg | SUBCUTANEOUS | 11 refills | Status: DC

## 2023-09-02 ENCOUNTER — Telehealth: Payer: Self-pay

## 2023-09-02 ENCOUNTER — Other Ambulatory Visit (HOSPITAL_COMMUNITY): Payer: Self-pay

## 2023-09-02 NOTE — Telephone Encounter (Signed)
*  sent to [facility] for possible PAP

## 2023-09-07 NOTE — Addendum Note (Signed)
 Addended by: Devoria Glassing on: 09/07/2023 02:23 PM   Modules accepted: Orders

## 2023-09-09 ENCOUNTER — Ambulatory Visit (INDEPENDENT_AMBULATORY_CARE_PROVIDER_SITE_OTHER): Payer: Self-pay | Admitting: *Deleted

## 2023-09-09 DIAGNOSIS — J309 Allergic rhinitis, unspecified: Secondary | ICD-10-CM

## 2023-09-13 ENCOUNTER — Ambulatory Visit (INDEPENDENT_AMBULATORY_CARE_PROVIDER_SITE_OTHER): Payer: Self-pay | Admitting: *Deleted

## 2023-09-13 DIAGNOSIS — J309 Allergic rhinitis, unspecified: Secondary | ICD-10-CM

## 2023-09-19 ENCOUNTER — Ambulatory Visit (INDEPENDENT_AMBULATORY_CARE_PROVIDER_SITE_OTHER): Payer: Self-pay | Admitting: *Deleted

## 2023-09-19 DIAGNOSIS — J309 Allergic rhinitis, unspecified: Secondary | ICD-10-CM | POA: Diagnosis not present

## 2023-09-21 ENCOUNTER — Other Ambulatory Visit: Payer: Self-pay | Admitting: Family Medicine

## 2023-09-26 ENCOUNTER — Ambulatory Visit (INDEPENDENT_AMBULATORY_CARE_PROVIDER_SITE_OTHER): Payer: Self-pay | Admitting: *Deleted

## 2023-09-26 DIAGNOSIS — J309 Allergic rhinitis, unspecified: Secondary | ICD-10-CM | POA: Diagnosis not present

## 2023-09-30 ENCOUNTER — Encounter: Payer: Self-pay | Admitting: Family Medicine

## 2023-09-30 ENCOUNTER — Ambulatory Visit (INDEPENDENT_AMBULATORY_CARE_PROVIDER_SITE_OTHER): Admitting: Family Medicine

## 2023-09-30 VITALS — BP 162/87 | HR 81 | Temp 98.3°F | Ht 63.0 in | Wt 146.2 lb

## 2023-09-30 DIAGNOSIS — B9689 Other specified bacterial agents as the cause of diseases classified elsewhere: Secondary | ICD-10-CM

## 2023-09-30 DIAGNOSIS — J019 Acute sinusitis, unspecified: Secondary | ICD-10-CM | POA: Diagnosis not present

## 2023-09-30 DIAGNOSIS — J302 Other seasonal allergic rhinitis: Secondary | ICD-10-CM

## 2023-09-30 DIAGNOSIS — F411 Generalized anxiety disorder: Secondary | ICD-10-CM

## 2023-09-30 MED ORDER — CEFDINIR 300 MG PO CAPS: 300.0000 mg | ORAL_CAPSULE | Freq: Two times a day (BID) | ORAL | 0 refills | Status: DC

## 2023-09-30 MED ORDER — METHYLPREDNISOLONE ACETATE 40 MG/ML IJ SUSP
40.0000 mg | Freq: Once | INTRAMUSCULAR | Status: AC
  Administered 2023-09-30: 40 mg via INTRAMUSCULAR

## 2023-09-30 MED ORDER — ALPRAZOLAM 0.5 MG PO TABS: 0.5000 mg | ORAL_TABLET | Freq: Every day | ORAL | 1 refills | Status: DC | PRN

## 2023-09-30 NOTE — Progress Notes (Signed)
 Subjective: CC: Sinusitis PCP: Raliegh Ip, DO MVH:QIONG Misty Clark is a 73 y.o. female presenting to clinic today for:  1.  Sinusitis Patient reports a 1 week history of progressively worsening sinus pain.  She reports pain over the frontal and maxillary sinuses with left-sided ear discomfort that is intermittent.  She reports no drainage from the ear.  She has had rhinorrhea cough.  No hemoptysis.  She has not had one of her injectables for about 4 months now and sadly her breathing has gotten a little worse.  She continues to see allergist for monthly allergy injections and is treated with multiple medications for her allergies, all of which have not been helpful with current symptoms.  2.  Anxiety disorder She is compliant with Celexa.  Typically uses only half to 1 tablet of Xanax a couple of times per week but notes that she has been utilizing it much more often lately because she is been having stressors related to her mother and nephew.  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 For high blood pressure, Disp: 90 tablet, Rfl: 3   atorvastatin (LIPITOR) 40 MG tablet, Take 1 tablet (40 mg total) by mouth daily., Disp: 90 tablet, Rfl:  3   Budeson-Glycopyrrol-Formoterol (BREZTRI AEROSPHERE) 160-9-4.8 MCG/ACT AERO, Inhale 2 puffs into the lungs in the morning and at bedtime., Disp: , Rfl:    citalopram (CELEXA) 20 MG tablet, TAKE 1 TABLET BY MOUTH ONCE DAILY ., Disp: 90 tablet, Rfl: 3   EPINEPHrine 0.3 mg/0.3 mL IJ SOAJ injection, Inject 0.3 mg into the muscle as needed for anaphylaxis. As needed for life-threatening allergic reactions, Disp: 2 each, Rfl: 1   famotidine (PEPCID) 40 MG tablet, Take 1 tablet (40 mg total) by mouth daily., Disp: 90 tablet, Rfl: 1   fluticasone (FLONASE) 50 MCG/ACT nasal spray, Place 2 sprays into both nostrils daily., Disp: 16 g, Rfl: 6   hydrochlorothiazide (HYDRODIURIL) 25 MG tablet, Take 0.5-1 tablets (12.5-25 mg total) by mouth daily as needed (leg swelling)., Disp: 90 tablet, Rfl: 3   ipratropium (ATROVENT) 0.06 % nasal spray, Place 1 spray into both nostrils 3 (three) times daily. every 8 hours as needed to help with drying out your nose, Disp: 45 mL, Rfl: 3   levocetirizine (XYZAL) 5 MG tablet, Take 1 tablet (5 mg total) by mouth every evening., Disp: 90 tablet, Rfl: 3   meclizine (ANTIVERT) 12.5 MG tablet, Take 1 tablet (12.5 mg total) by mouth 3 (three) times daily as needed for dizziness., Disp: 30 tablet, Rfl: 1   montelukast (SINGULAIR) 10 MG tablet, Take 1  tablet (10 mg total) by mouth at bedtime., Disp: 90 tablet, Rfl: 3   pantoprazole (PROTONIX) 40 MG tablet, Take 1 tablet (40 mg total) by mouth daily., Disp: 30 tablet, Rfl: 3   Vitamin D, Ergocalciferol, (DRISDOL) 1.25 MG (50000 UNIT) CAPS capsule, Take 1 capsule (50,000 Units total) by mouth every 7 (seven) days., Disp: 12 capsule, Rfl: 3 Social History   Socioeconomic History   Marital status: Widowed    Spouse name: Not on file   Number of children: 4   Years of education: Not on file   Highest education level: Some college, no degree  Occupational History   Occupation: Retired Nursing    Comment: [employer] [facility]/ HH  Tobacco  Use   Smoking status: Former    Current packs/day: 0.50    Average packs/day: 0.5 packs/day for 3.0 years (1.5 ttl pk-yrs)    Types: Cigarettes   Smokeless tobacco: Never   Tobacco comments:    Quit many years ago.  Vaping Use   Vaping status: Never Used  Substance and Sexual Activity   Alcohol use: No   Drug use: Never   Sexual activity: Not on file  Other Topics Concern   Not on file  Social History Narrative   Retired Arts administrator. 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 Lives alone. Enjoys crafting, puzzles, reading, and gardening.    Social Drivers of Corporate investment banker Strain: Low Risk  (09/29/2022)   Overall Financial Resource Strain (CARDIA)    Difficulty of Paying Living Expenses: Not hard at all  Food Insecurity: No Food Insecurity (09/29/2022)   Hunger Vital Sign    Worried About Running Out of Food in the Last Year: Never true    Ran Out of Food in the Last Year: Never true  Transportation Needs: No Transportation Needs (09/29/2022)   PRAPARE - Administrator, Civil Service (Medical): No    Lack of Transportation (Non-Medical): No  Physical Activity: Sufficiently Active (09/29/2022)   Exercise Vital Sign    Days of Exercise per Week: 5 days    Minutes of Exercise per Session: 30 min  Stress: No Stress Concern Present (09/29/2022)   [employer] of Occupational Health - Occupational Stress Questionnaire    Feeling of Stress : Not at all  Social Connections: Moderately Integrated (09/29/2022)   Social Connection and Isolation Panel [NHANES]    Frequency of Communication with Friends and Family: More than three times a week    Frequency of Social Gatherings with Friends and Family: More than three times a week    Attends Religious Services: More than 4 times per year    Active Member of [employer] or Organizations: Yes    Attends Banker Meetings: More than 4 times per year    Marital Status: Widowed  Intimate Partner Violence: Not  At Risk (09/29/2022)   Humiliation, Afraid, Rape, and Kick questionnaire    Fear of Current or Ex-Partner: No    Emotionally Abused: No    Physically Abused: No    Sexually Abused: No   Family History  Problem Relation Age of Onset   Asthma Mother    Thyroid disease Mother    Heart disease Father    Breast cancer Sister    Hypertension Daughter    Heart failure Maternal Grandmother        CHF   Heart attack Paternal Grandfather        MI   Coronary artery disease Paternal Grandfather  At a premature age   Coronary artery disease Other    Colon cancer Other    Prostate cancer Other    Ovarian cancer Other    Allergic rhinitis Neg Hx    Angioedema Neg Hx    Atopy Neg Hx    Eczema Neg Hx    Immunodeficiency Neg Hx    Urticaria Neg Hx     Objective: Office vital signs reviewed. BP (!) 162/87   Pulse 81   Temp 98.3 F (36.8 C)   Ht 5\' 3"  (1.6 m)   Wt 146 lb 3.2 oz (66.3 kg)   SpO2 96%   BMI 25.90 kg/m   Physical Examination:  General: Awake, alert, nontoxic female, No acute distress HEENT: TMs intact bilaterally.  Left TM bulging but no purulence behind TM.  No significant erythema.  No drainage in external auditory canal.  Oropharynx without exudates. Cardio: regular rate and rhythm, S1S2 heard, no murmurs appreciated Pulm: Globally creased breath sounds with no appreciable wheezes.  Normal work of breathing on room air with intermittent coughing     06/17/2023    9:59 AM 12/29/2022   11:15 AM 09/29/2022   10:28 AM  Depression screen PHQ 2/9  Decreased Interest 1 1 0  Down, Depressed, Hopeless 1 1 0  PHQ - 2 Score 2 2 0  Altered sleeping 1 1 0  Tired, decreased energy 0 0 0  Change in appetite 0 0 0  Feeling bad or failure about yourself  0 1 0  Trouble concentrating 0 1 0  Moving slowly or fidgety/restless 0 0 0  Suicidal thoughts 0 0 0  PHQ-9 Score 3 5 0  Difficult doing work/chores  Not difficult at all Not difficult at all      06/17/2023     9:55 AM 05/18/2023   10:03 AM 12/29/2022   11:16 AM 09/10/2022   10:10 AM  GAD 7 : Generalized Anxiety Score  Nervous, Anxious, on Edge 0 0 1 0  Control/stop worrying 0 0 1 0  Worry too much - different things 0 0 1 0  Trouble relaxing 0 0 1 0  Restless 0 0 0 0  Easily annoyed or irritable 0 0 0 0  Afraid - awful might happen 0 0 0 0  Total GAD 7 Score 0 0 4 0  Anxiety Difficulty  Not difficult at all Not difficult at all Not difficult at all   Assessment/ Plan: 73 y.o. female   Acute bacterial sinusitis - Plan: methylPREDNISolone acetate (DEPO-MEDROL) injection 40 mg, cefdinir (OMNICEF) 300 MG capsule  Seasonal and perennial allergic rhinitis - Plan: methylPREDNISolone acetate (DEPO-MEDROL) injection 40 mg  Generalized anxiety disorder - Plan: ALPRAZolam (XANAX) 0.5 MG tablet  Going to treat her for acute bacterial sinusitis given progressive symptoms.  Omnicef prescribed.  Depo-Medrol provided given decreased breath sounds globally.  Encouraged her to utilize her Markus Daft as prescribed.  Anxiety has been up some due to family issues.  Alprazolam renewed.  Up-to-date on UDS and CSC and the national narcotic database was reviewed with no red flags   Raliegh Ip, DO [facility] [phone]

## 2023-10-04 ENCOUNTER — Ambulatory Visit (INDEPENDENT_AMBULATORY_CARE_PROVIDER_SITE_OTHER)

## 2023-10-04 VITALS — BP 162/87 | HR 81 | Ht 63.0 in | Wt 146.0 lb

## 2023-10-04 DIAGNOSIS — Z0000 Encounter for general adult medical examination without abnormal findings: Secondary | ICD-10-CM | POA: Diagnosis not present

## 2023-10-04 NOTE — Progress Notes (Signed)
 Subjective:   Misty Clark is a 73 y.o. who presents for a Medicare Wellness preventive visit.  Visit Complete: Virtual I connected with  Misty Clark on 10/04/23 by a audio enabled telemedicine application and verified that I am speaking with the correct person using two identifiers.  Patient Location: Home  Provider Location: Home Office  I discussed the limitations of evaluation and management by telemedicine. The patient expressed understanding and agreed to proceed.  Vital Signs: Because this visit was a virtual/telehealth visit, some criteria may be missing or patient reported. Any vitals not documented were not able to be obtained and vitals that have been documented are patient reported.  VideoDeclined- This patient declined Librarian, academic. Therefore the visit was completed with audio only.  Persons Participating in Visit: Patient.  AWV Questionnaire: No: Patient Medicare AWV questionnaire was not completed prior to this visit.  Cardiac Risk Factors include: advanced age (>71men, >1 women);hypertension     Objective:    Today's Vitals   10/04/23 1015  BP: (!) 162/87  Pulse: 81  Weight: 146 lb (66.2 kg)  Height: 5\' 3"  (1.6 m)   Body mass index is 25.86 kg/m.     10/04/2023   10:02 AM 09/29/2022   10:30 AM 09/23/2021   10:39 AM 07/28/2020   10:43 AM  Advanced Directives  Does Patient Have a Medical Advance Directive? No No No No  Would patient like information on creating a medical advance directive?  No - Patient declined No - Patient declined No - Patient declined    Current Medications (verified) Outpatient Encounter Medications as of 10/04/2023  Medication Sig   albuterol (VENTOLIN HFA) 108 (90 Base) MCG/ACT inhaler INHALE 2 PUFFS BY MOUTH EVERY 6 HOURS AS NEEDED FOR WHEEZING AND FOR SHORTNESS OF BREATH   ALPRAZolam (XANAX) 0.5 MG tablet Take 1 tablet (0.5 mg total) by mouth daily as needed for anxiety.   amLODipine  (NORVASC) 2.5 MG tablet Take 1 tablet (2.5 mg total) by mouth daily. For high blood pressure   atorvastatin (LIPITOR) 40 MG tablet Take 1 tablet (40 mg total) by mouth daily.   Budeson-Glycopyrrol-Formoterol (BREZTRI AEROSPHERE) 160-9-4.8 MCG/ACT AERO Inhale 2 puffs into the lungs in the morning and at bedtime.   cefdinir (OMNICEF) 300 MG capsule Take 1 capsule (300 mg total) by mouth 2 (two) times daily. 1 po BID   citalopram (CELEXA) 20 MG tablet TAKE 1 TABLET BY MOUTH ONCE DAILY .   EPINEPHrine 0.3 mg/0.3 mL IJ SOAJ injection Inject 0.3 mg into the muscle as needed for anaphylaxis. As needed for life-threatening allergic reactions   famotidine (PEPCID) 40 MG tablet Take 1 tablet (40 mg total) by mouth daily.   fluticasone (FLONASE) 50 MCG/ACT nasal spray Place 2 sprays into both nostrils daily.   hydrochlorothiazide (HYDRODIURIL) 25 MG tablet Take 0.5-1 tablets (12.5-25 mg total) by mouth daily as needed (leg swelling).   ipratropium (ATROVENT) 0.06 % nasal spray Place 1 spray into both nostrils 3 (three) times daily. every 8 hours as needed to help with drying out your nose   levocetirizine (XYZAL) 5 MG tablet Take 1 tablet (5 mg total) by mouth every evening.   meclizine (ANTIVERT) 12.5 MG tablet Take 1 tablet (12.5 mg total) by mouth 3 (three) times daily as needed for dizziness.   montelukast (SINGULAIR) 10 MG tablet Take 1 tablet (10 mg total) by mouth at bedtime.   pantoprazole (PROTONIX) 40 MG tablet Take 1 tablet (40 mg  total) by mouth daily.   Vitamin D, Ergocalciferol, (DRISDOL) 1.25 MG (50000 UNIT) CAPS capsule Take 1 capsule (50,000 Units total) by mouth every 7 (seven) days.   No facility-administered encounter medications on file as of 10/04/2023.    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   Hypertension    IBS (irritable  bowel syndrome)    Mixed   Multiple allergies    Recurrent upper respiratory infection (URI)    Past Surgical History:  Procedure Laterality Date   ABDOMINAL HYSTERECTOMY     CHOLECYSTECTOMY     SHOULDER SURGERY Left 2012   SINOSCOPY     SINUS SURGERY WITH INSTATRAK     Family History  Problem Relation Age of Onset   Asthma Mother    Thyroid disease Mother    Heart disease Father    Breast cancer Sister    Hypertension Daughter    Heart failure Maternal Grandmother        CHF   Heart attack Paternal Grandfather        MI   Coronary artery disease Paternal Grandfather        At a premature age   Coronary artery disease Other    Colon cancer Other    Prostate cancer Other    Ovarian cancer Other    Allergic rhinitis Neg Hx    Angioedema Neg Hx    Atopy Neg Hx    Eczema Neg Hx    Immunodeficiency Neg Hx    Urticaria Neg Hx    Social History   Socioeconomic History   Marital status: Widowed    Spouse name: Not on file   Number of children: 4   Years of education: Not on file   Highest education level: Some college, no degree  Occupational History   Occupation: Retired Nursing    Comment: [employer] [facility]/ HH  Tobacco Use   Smoking status: Former    Current packs/day: 0.50    Average packs/day: 0.5 packs/day for 3.0 years (1.5 ttl pk-yrs)    Types: Cigarettes   Smokeless tobacco: Never   Tobacco comments:    Quit many years ago.  Vaping Use   Vaping status: Never Used  Substance and Sexual Activity   Alcohol use: No   Drug use: Never   Sexual activity: Not on file  Other Topics Concern   Not on file  Social History Narrative   Retired Arts administrator. 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 Lives alone. Enjoys crafting, puzzles, reading, and gardening.    Social Drivers of Health   Financial Resource Strain: Medium Risk (10/04/2023)   Overall Financial Resource Strain (CARDIA)    Difficulty of Paying Living Expenses: Somewhat hard  Food Insecurity: No  Food Insecurity (10/04/2023)   Hunger Vital Sign    Worried About Running Out of Food in the Last Year: Never true    Ran Out of Food in the Last Year: Never true  Transportation Needs: No Transportation Needs (10/04/2023)   PRAPARE - Administrator, Civil Service (Medical): No    Lack of Transportation (Non-Medical): No  Physical Activity: Insufficiently Active (10/04/2023)   Exercise Vital Sign    Days of Exercise per Week: 3 days    Minutes of Exercise per Session: 30 min  Stress: No Stress Concern Present (10/04/2023)  [employer] of Occupational Health - Occupational Stress Questionnaire    Feeling of Stress : Only a little  Social Connections: Moderately Isolated (10/04/2023)   Social Connection and Isolation Panel [NHANES]    Frequency of Communication with Friends and Family: More than three times a week    Frequency of Social Gatherings with Friends and Family: More than three times a week    Attends Religious Services: More than 4 times per year    Active Member of [employer] or Organizations: No    Attends Banker Meetings: Never    Marital Status: Widowed    Tobacco Counseling Counseling given: Yes Tobacco comments: Quit many years ago.    Clinical Intake:  Pre-visit preparation completed: Yes  Pain : No/denies pain     BMI - recorded: 25.9 Nutritional Risks: None Diabetes: No  No results found for: "HGBA1C"   How often do you need to have someone help you when you read instructions, pamphlets, or other written materials from your doctor or pharmacy?: 1 - Never  Interpreter Needed?: No  Information entered by :: Misty Clark   Activities of Daily Living     10/04/2023   10:00 AM  In your present state of health, do you have any difficulty performing the following activities:  Hearing? 0  Vision? 0  Difficulty concentrating or making decisions? 0  Walking or climbing stairs? 0  Dressing or bathing? 0  Doing errands, shopping? 0   Preparing Food and eating ? N  Using the Toilet? N  In the past six months, have you accidently leaked urine? N  Do you have problems with loss of bowel control? N  Managing your Medications? N  Managing your Finances? N  Housekeeping or managing your Housekeeping? N    Patient Care Team: Raliegh Ip, DO as PCP - General (Family Medicine)  Indicate any recent Medical Services you may have received from other than [facility] providers in the past year (date may be approximate).     Assessment:   This is a routine wellness examination for Misty Clark.  Hearing/Vision screen Hearing Screening - Comments:: Pt denies hearing def  Vision Screening - Comments:: Pt denies vision def [facility] [place]   Goals Addressed             This Visit's Progress    Patient Stated       Looking into moving to a different house/going to the beach       Depression Screen     09/30/2023    1:19 PM 06/17/2023    9:59 AM 12/29/2022   11:15 AM 09/29/2022   10:28 AM 09/10/2022   10:10 AM 08/23/2022   10:13 AM 06/15/2022   10:23 AM  PHQ 2/9 Scores  PHQ - 2 Score 3 2 2  0 0 0 0  PHQ- 9 Score 7 3 5  0 0 0 0    Fall Risk     10/04/2023   10:17 AM 06/17/2023    9:56 AM 05/18/2023   10:11 AM 12/29/2022   11:15 AM 09/29/2022   10:26 AM  Fall Risk   Falls in the past year? 0 1 1 0 0  Number falls in past yr: 0 0 0  0  Injury with Fall? 0 1 1  0  Risk for fall due to : No Fall Risks  No Fall Risks  No Fall Risks  Follow up Falls prevention discussed;Falls evaluation completed  Falls evaluation completed Falls evaluation  completed Falls prevention discussed    MEDICARE RISK AT HOME:  Medicare Risk at Home Any stairs in or around the home?: Yes If so, are there any without handrails?: Yes Home free of loose throw rugs in walkways, pet beds, electrical cords, etc?: Yes Adequate lighting in your home to reduce risk of falls?: Yes Life alert?: No Use of a cane, walker or w/c?: No Grab bars in  the bathroom?: Yes Shower chair or bench in shower?: No Elevated toilet seat or a handicapped toilet?: No  TIMED UP AND GO:  Was the test performed?  NO  Cognitive Function: 6CIT completed        10/04/2023   10:11 AM 09/29/2022   10:31 AM 07/28/2020   10:45 AM  6CIT Screen  What Year? 0 points 0 points 0 points  What month? 0 points 0 points 0 points  What time? 0 points 0 points 0 points  Count back from 20 0 points 0 points 0 points  Months in reverse 0 points 0 points 0 points  Repeat phrase 0 points 0 points 0 points  Total Score 0 points 0 points 0 points    Immunizations Immunization History  Administered Date(s) Administered   Influenza,inj,Quad PF,6+ Mos 03/29/2018   Janssen (J&J) SARS-COV-2 Vaccination 03/25/2020   Pneumococcal Conjugate-13 06/02/2015   Pneumococcal Polysaccharide-23 07/28/2016   Zoster Recombinant(Shingrix) 12/11/2021    Screening Tests Health Maintenance  Topic Date Due   Zoster Vaccines- Shingrix (2 of 2) 02/05/2022   COVID-19 Vaccine (2 - Janssen risk series) 10/16/2023 (Originally 04/22/2020)   DTaP/Tdap/Td (1 - Tdap) 07/03/2024 (Originally 05/04/1970)   DEXA SCAN  07/03/2024 (Originally 10/02/2021)   Fecal DNA (Cologuard)  07/03/2024 (Originally 05/05/1995)   MAMMOGRAM  09/29/2024 (Originally 02/16/2022)   INFLUENZA VACCINE  02/03/2024   Medicare Annual Wellness (AWV)  10/03/2024   Pneumonia Vaccine 68+ Years old  Completed   Hepatitis C Screening  Completed   HPV VACCINES  Aged Out    Health Maintenance  Health Maintenance Due  Topic Date Due   Zoster Vaccines- Shingrix (2 of 2) 02/05/2022   Health Maintenance Items Addressed: See Nurse Notes  Additional Screening:  Vision Screening: Recommended annual ophthalmology exams for early detection of glaucoma and other disorders of the eye.  Dental Screening: Recommended annual dental exams for proper oral hygiene  Community Resource Referral / Chronic Care Management: CRR  required this visit?  No  CCM required this visit?  No     Plan:     I have personally reviewed and noted the following in the patient's chart:   Medical and social history Use of alcohol, tobacco or illicit drugs  Current medications and supplements including opioid prescriptions. Patient is not currently taking opioid prescriptions. Functional ability and status Nutritional status Physical activity Advanced directives List of other physicians Hospitalizations, surgeries, and ER visits in previous 12 months Vitals Screenings to include cognitive, depression, and falls Referrals and appointments  In addition, I have reviewed and discussed with patient certain preventive protocols, quality metrics, and best practice recommendations. A written personalized care plan for preventive services as well as general preventive health recommendations were provided to patient.     Arta Silence, CMA   10/04/2023   After Visit Summary: (Declined) Due to this being a telephonic visit, with patients personalized plan was offered to patient but patient Declined AVS at this time   Notes: Nothing significant to report at this time.

## 2023-10-04 NOTE — Patient Instructions (Addendum)
 Ms. Misty Clark , Thank you for taking time to come for your Medicare Wellness Visit. I appreciate your ongoing commitment to your health goals. Please review the following plan we discussed and let me know if I can assist you in the future.   Referrals/Orders/Follow-Ups/Clinician Recommendations: Keep up the work on keeping your health up to date.   This is a list of the screening recommended for you and due dates:  Health Maintenance  Topic Date Due   Zoster (Shingles) Vaccine (2 of 2) 02/05/2022   COVID-19 Vaccine (2 - Janssen risk series) 10/16/2023*   DTaP/Tdap/Td vaccine (1 - Tdap) 07/03/2024*   DEXA scan (bone density measurement)  07/03/2024*   Cologuard (Stool DNA test)  07/03/2024*   Mammogram  09/29/2024*   Flu Shot  02/03/2024   Medicare Annual Wellness Visit  10/03/2024   Pneumonia Vaccine  Completed   Hepatitis C Screening  Completed   HPV Vaccine  Aged Out  *Topic was postponed. The date shown is not the original due date.   Advanced directives: (Declined) Advance directive discussed with you today. Even though you declined this today, please call our office should you change your mind, and we can give you the proper paperwork for you to fill out.  Next Medicare Annual Wellness Visit scheduled for next year: Yes

## 2023-10-10 ENCOUNTER — Ambulatory Visit (INDEPENDENT_AMBULATORY_CARE_PROVIDER_SITE_OTHER): Payer: Self-pay

## 2023-10-10 DIAGNOSIS — J309 Allergic rhinitis, unspecified: Secondary | ICD-10-CM | POA: Diagnosis not present

## 2023-10-18 ENCOUNTER — Ambulatory Visit (INDEPENDENT_AMBULATORY_CARE_PROVIDER_SITE_OTHER): Payer: Self-pay

## 2023-10-18 DIAGNOSIS — J309 Allergic rhinitis, unspecified: Secondary | ICD-10-CM

## 2023-10-26 ENCOUNTER — Other Ambulatory Visit: Payer: Self-pay | Admitting: Family Medicine

## 2023-10-26 ENCOUNTER — Ambulatory Visit (INDEPENDENT_AMBULATORY_CARE_PROVIDER_SITE_OTHER): Payer: Self-pay

## 2023-10-26 DIAGNOSIS — R058 Other specified cough: Secondary | ICD-10-CM

## 2023-10-26 DIAGNOSIS — J309 Allergic rhinitis, unspecified: Secondary | ICD-10-CM | POA: Diagnosis not present

## 2023-10-26 DIAGNOSIS — K219 Gastro-esophageal reflux disease without esophagitis: Secondary | ICD-10-CM

## 2023-10-31 ENCOUNTER — Other Ambulatory Visit: Payer: Self-pay

## 2023-10-31 DIAGNOSIS — Z1211 Encounter for screening for malignant neoplasm of colon: Secondary | ICD-10-CM

## 2023-10-31 NOTE — Patient Instructions (Signed)
 Our records indicate that you are due for your screening mammogram.  Please call the imaging center that does your yearly mammograms to make an appointment for a mammogram at your earliest convenience. Our office also has a mobile unit through the [facility] that comes to our location. Please call our office if you would like to make an appointment.

## 2023-11-01 ENCOUNTER — Ambulatory Visit (INDEPENDENT_AMBULATORY_CARE_PROVIDER_SITE_OTHER)

## 2023-11-01 ENCOUNTER — Encounter: Payer: Self-pay | Admitting: Family Medicine

## 2023-11-01 VITALS — BP 136/80 | HR 84 | Temp 98.8°F | Ht 63.0 in | Wt 138.8 lb

## 2023-11-01 DIAGNOSIS — Z77120 Contact with and (suspected) exposure to mold (toxic): Secondary | ICD-10-CM | POA: Diagnosis not present

## 2023-11-01 DIAGNOSIS — J3089 Other allergic rhinitis: Secondary | ICD-10-CM

## 2023-11-01 MED ORDER — METHYLPREDNISOLONE ACETATE 40 MG/ML IJ SUSP
40.0000 mg | Freq: Once | INTRAMUSCULAR | Status: AC
Start: 2023-11-01 — End: 2023-11-01
  Administered 2023-11-01: 40 mg via INTRAMUSCULAR

## 2023-11-01 MED ORDER — PREDNISONE 10 MG (21) PO TBPK
ORAL_TABLET | ORAL | 0 refills | Status: DC
Start: 2023-11-01 — End: 2024-02-06

## 2023-11-01 NOTE — Progress Notes (Addendum)
 Subjective: CC: Allergic rhinitis PCP: Eliodoro Guerin, DO ZOX:[id number] Misty Clark is a 73 y.o. female presenting to clinic today for:  1.  Rhinosinusitis Patient reports that they were cleaning out her home, which has a known mold problem, and over the weekend she just had a very strong sinusitis reaction.  She reports some left-sided ear fullness, congestion, drainage.  She is on allergy shots and had actually been feeling a little bit better on those up until this most recent exposure.  She is compliant with Singulair , Xyzal .  She has EpiPen  on hand if needed.  In March she was treated for sinus infection that was felt to be likely secondary to complications of severe allergic symptoms due to above as well.  She reports no fever.  No pus from nose.   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 For high blood pressure, Disp: 90 tablet, Rfl: 3   atorvastatin  (LIPITOR) 40 MG tablet, Take 1 tablet (40 mg total) by mouth daily., Disp: 90 tablet, Rfl: 3   Budeson-Glycopyrrol-Formoterol (BREZTRI  AEROSPHERE) 160-9-4.8 MCG/ACT AERO, Inhale 2 puffs into the lungs in the morning and at bedtime., Disp: , Rfl:    cefdinir  (OMNICEF ) 300 MG capsule, Take 1  capsule (300 mg total) by mouth 2 (two) times daily. 1 po BID, Disp: 20 capsule, Rfl: 0   citalopram  (CELEXA ) 20 MG tablet, TAKE 1 TABLET BY MOUTH ONCE DAILY ., Disp: 90 tablet, Rfl: 3   EPINEPHrine  0.3 mg/0.3 mL IJ SOAJ injection, Inject 0.3 mg into the muscle as needed for anaphylaxis. As needed for life-threatening allergic reactions, Disp: 2 each, Rfl: 1   famotidine  (PEPCID ) 40 MG tablet, Take 1 tablet (40 mg total) by mouth daily., Disp: 90 tablet, Rfl: 1   fluticasone  (FLONASE ) 50 MCG/ACT nasal spray, Place 2 sprays into both nostrils daily., Disp: 16 g, Rfl: 6   hydrochlorothiazide  (HYDRODIURIL ) 25 MG tablet, Take 0.5-1 tablets (12.5-25 mg total) by mouth daily as needed (leg swelling)., Disp: 90 tablet, Rfl: 3   ipratropium (ATROVENT ) 0.06 % nasal spray, Place 1 spray into both nostrils 3 (three) times daily. every 8 hours as needed to help with drying out your nose, Disp: 45 mL, Rfl: 3   levocetirizine (XYZAL ) 5 MG tablet, Take 1 tablet (5 mg total) by mouth every evening., Disp: 90 tablet, Rfl: 3   meclizine  (ANTIVERT ) 12.5 MG tablet, Take 1 tablet (12.5 mg total) by mouth 3 (three) times daily as needed for dizziness., Disp: 30 tablet, Rfl: 1   montelukast  (SINGULAIR ) 10 MG tablet, Take 1 tablet (10 mg total) by mouth at  bedtime., Disp: 90 tablet, Rfl: 3   pantoprazole  (PROTONIX ) 40 MG tablet, Take 1 tablet by mouth once daily, Disp: 90 tablet, Rfl: 1   Vitamin D , Ergocalciferol , (DRISDOL ) 1.25 MG (50000 UNIT) CAPS capsule, Take 1 capsule (50,000 Units total) by mouth every 7 (seven) days., Disp: 12 capsule, Rfl: 3 Social History   Socioeconomic History   Marital status: Widowed    Spouse name: Not on file   Number of children: 4   Years of education: Not on file   Highest education level: Some college, no degree  Occupational History   Occupation: Retired Nursing    Comment: [employer]/ HH  Tobacco Use   Smoking status: Former    Current packs/day: 0.50    Average  packs/day: 0.5 packs/day for 3.0 years (1.5 ttl pk-yrs)    Types: Cigarettes   Smokeless tobacco: Never   Tobacco comments:    Quit many years ago.  Vaping Use   Vaping status: Never Used  Substance and Sexual Activity   Alcohol use: No   Drug use: Never   Sexual activity: Not on file  Other Topics Concern   Not on file  Social History Narrative   Retired Arts administrator. 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 Lives alone. Enjoys crafting, puzzles, reading, and gardening.    Social Drivers of Health   Financial Resource Strain: Medium Risk (10/04/2023)   Overall Financial Resource Strain (CARDIA)    Difficulty of Paying Living Expenses: Somewhat hard  Food Insecurity: No Food Insecurity (10/04/2023)   Hunger Vital Sign    Worried About Running Out of Food in the Last Year: Never true    Ran Out of Food in the Last Year: Never true  Transportation Needs: No Transportation Needs (10/04/2023)   PRAPARE - Administrator, Civil Service (Medical): No    Lack of Transportation (Non-Medical): No  Physical Activity: Insufficiently Active (10/04/2023)   Exercise Vital Sign    Days of Exercise per Week: 3 days    Minutes of Exercise per Session: 30 min  Stress: No Stress Concern Present (10/04/2023)   [employer] of Occupational Health - Occupational Stress Questionnaire    Feeling of Stress : Only a little  Social Connections: Moderately Isolated (10/04/2023)   Social Connection and Isolation Panel [NHANES]    Frequency of Communication with Friends and Family: More than three times a week    Frequency of Social Gatherings with Friends and Family: More than three times a week    Attends Religious Services: More than 4 times per year    Active Member of [employer] or Organizations: No    Attends Banker Meetings: Never    Marital Status: Widowed  Intimate Partner Violence: Not At Risk (10/04/2023)   Humiliation, Afraid, Rape, and Kick questionnaire    Fear of Current or  Ex-Partner: No    Emotionally Abused: No    Physically Abused: No    Sexually Abused: No   Family History  Problem Relation Age of Onset   Asthma Mother    Thyroid  disease Mother    Heart disease Father    Breast cancer Sister    Hypertension Daughter    Heart failure Maternal Grandmother        CHF   Heart attack Paternal Grandfather        MI   Coronary artery disease Paternal Grandfather        At a premature age   Coronary artery disease Other  Colon cancer Other    Prostate cancer Other    Ovarian cancer Other    Allergic rhinitis Neg Hx    Angioedema Neg Hx    Atopy Neg Hx    Eczema Neg Hx    Immunodeficiency Neg Hx    Urticaria Neg Hx     Objective: Office vital signs reviewed. BP 136/80   Pulse 84   Temp 98.8 F (37.1 C)   Ht 5\' 3"  (1.6 m)   Wt 138 lb 12.8 oz (63 kg)   SpO2 94%   BMI 24.59 kg/m   Physical Examination:  General: Awake, alert, nontoxic-appearing female, No acute distress HEENT: No facial swelling.  No conjunctival injection.  Hemostatic bleed in the left nare appreciated along the septum Cardio: regular rate and rhythm, S1S2 heard, no murmurs appreciated Pulm: Mild expiratory wheeze at the right lower base.  Otherwise clear to auscultation bilaterally with normal work of breathing on room air  Assessment/ Plan: 72 y.o. female   Non-seasonal allergic rhinitis due to fungal spores - Plan: methylPREDNISolone  acetate (DEPO-MEDROL ) injection 40 mg, predniSONE  (STERAPRED UNI-PAK 21 TAB) 10 MG (21) TBPK tablet  Mold exposure - Plan: methylPREDNISolone  acetate (DEPO-MEDROL ) injection 40 mg, predniSONE  (STERAPRED UNI-PAK 21 TAB) 10 MG (21) TBPK tablet  Depo-Medrol  given.  No evidence of secondary bacterial infection at this time.  Prednisone  Dosepak given for as needed use.  Encouraged her to follow-up with myself and/or her allergist as needed   Eliodoro Guerin, DO [facility] [phone]

## 2023-11-09 ENCOUNTER — Encounter: Payer: Self-pay | Admitting: Family Medicine

## 2023-11-17 ENCOUNTER — Ambulatory Visit (INDEPENDENT_AMBULATORY_CARE_PROVIDER_SITE_OTHER)

## 2023-11-17 DIAGNOSIS — J309 Allergic rhinitis, unspecified: Secondary | ICD-10-CM | POA: Diagnosis not present

## 2023-11-23 ENCOUNTER — Ambulatory Visit (INDEPENDENT_AMBULATORY_CARE_PROVIDER_SITE_OTHER)

## 2023-11-23 DIAGNOSIS — J309 Allergic rhinitis, unspecified: Secondary | ICD-10-CM | POA: Diagnosis not present

## 2023-12-01 ENCOUNTER — Ambulatory Visit (INDEPENDENT_AMBULATORY_CARE_PROVIDER_SITE_OTHER): Payer: Self-pay

## 2023-12-01 DIAGNOSIS — J309 Allergic rhinitis, unspecified: Secondary | ICD-10-CM | POA: Diagnosis not present

## 2023-12-15 ENCOUNTER — Ambulatory Visit (INDEPENDENT_AMBULATORY_CARE_PROVIDER_SITE_OTHER): Payer: Self-pay

## 2023-12-15 DIAGNOSIS — J309 Allergic rhinitis, unspecified: Secondary | ICD-10-CM

## 2023-12-19 ENCOUNTER — Other Ambulatory Visit: Payer: Self-pay | Admitting: Family

## 2023-12-22 ENCOUNTER — Ambulatory Visit (INDEPENDENT_AMBULATORY_CARE_PROVIDER_SITE_OTHER): Payer: Self-pay

## 2023-12-22 DIAGNOSIS — J309 Allergic rhinitis, unspecified: Secondary | ICD-10-CM | POA: Diagnosis not present

## 2023-12-25 ENCOUNTER — Other Ambulatory Visit: Payer: Self-pay | Admitting: Family Medicine

## 2023-12-25 DIAGNOSIS — I10 Essential (primary) hypertension: Secondary | ICD-10-CM

## 2023-12-29 ENCOUNTER — Ambulatory Visit

## 2023-12-29 DIAGNOSIS — J309 Allergic rhinitis, unspecified: Secondary | ICD-10-CM | POA: Diagnosis not present

## 2024-01-05 ENCOUNTER — Ambulatory Visit (INDEPENDENT_AMBULATORY_CARE_PROVIDER_SITE_OTHER)

## 2024-01-05 DIAGNOSIS — J309 Allergic rhinitis, unspecified: Secondary | ICD-10-CM

## 2024-01-10 ENCOUNTER — Ambulatory Visit (INDEPENDENT_AMBULATORY_CARE_PROVIDER_SITE_OTHER)

## 2024-01-10 DIAGNOSIS — J309 Allergic rhinitis, unspecified: Secondary | ICD-10-CM | POA: Diagnosis not present

## 2024-01-19 ENCOUNTER — Other Ambulatory Visit: Payer: Self-pay | Admitting: Family Medicine

## 2024-01-31 ENCOUNTER — Ambulatory Visit (INDEPENDENT_AMBULATORY_CARE_PROVIDER_SITE_OTHER)

## 2024-01-31 DIAGNOSIS — J309 Allergic rhinitis, unspecified: Secondary | ICD-10-CM

## 2024-02-06 ENCOUNTER — Encounter: Payer: Self-pay | Admitting: Family Medicine

## 2024-02-06 ENCOUNTER — Ambulatory Visit: Payer: Medicare HMO | Admitting: Family Medicine

## 2024-02-06 VITALS — BP 178/88 | HR 81 | Temp 97.7°F | Ht 65.0 in | Wt 143.0 lb

## 2024-02-06 DIAGNOSIS — I10 Essential (primary) hypertension: Secondary | ICD-10-CM | POA: Diagnosis not present

## 2024-02-06 DIAGNOSIS — J3089 Other allergic rhinitis: Secondary | ICD-10-CM

## 2024-02-06 DIAGNOSIS — M8588 Other specified disorders of bone density and structure, other site: Secondary | ICD-10-CM | POA: Diagnosis not present

## 2024-02-06 DIAGNOSIS — J302 Other seasonal allergic rhinitis: Secondary | ICD-10-CM

## 2024-02-06 DIAGNOSIS — J4489 Other specified chronic obstructive pulmonary disease: Secondary | ICD-10-CM

## 2024-02-06 DIAGNOSIS — K219 Gastro-esophageal reflux disease without esophagitis: Secondary | ICD-10-CM

## 2024-02-06 DIAGNOSIS — Z0001 Encounter for general adult medical examination with abnormal findings: Secondary | ICD-10-CM

## 2024-02-06 DIAGNOSIS — E782 Mixed hyperlipidemia: Secondary | ICD-10-CM | POA: Diagnosis not present

## 2024-02-06 DIAGNOSIS — F411 Generalized anxiety disorder: Secondary | ICD-10-CM | POA: Diagnosis not present

## 2024-02-06 DIAGNOSIS — Z0000 Encounter for general adult medical examination without abnormal findings: Secondary | ICD-10-CM

## 2024-02-06 LAB — LIPID PANEL

## 2024-02-06 MED ORDER — CITALOPRAM HYDROBROMIDE 20 MG PO TABS: ORAL_TABLET | ORAL | 3 refills | Status: AC

## 2024-02-06 MED ORDER — ALPRAZOLAM 0.5 MG PO TABS: 0.5000 mg | ORAL_TABLET | Freq: Every day | ORAL | 1 refills | Status: AC | PRN

## 2024-02-06 MED ORDER — ATORVASTATIN CALCIUM 40 MG PO TABS: 40.0000 mg | ORAL_TABLET | Freq: Every day | ORAL | 3 refills | Status: AC

## 2024-02-06 MED ORDER — HYDROCHLOROTHIAZIDE 25 MG PO TABS: 12.5000 mg | ORAL_TABLET | Freq: Every day | ORAL | 3 refills | Status: AC | PRN

## 2024-02-06 MED ORDER — VITAMIN D (ERGOCALCIFEROL) 1.25 MG (50000 UNIT) PO CAPS: 50000.0000 [IU] | ORAL_CAPSULE | ORAL | 3 refills | Status: AC

## 2024-02-06 MED ORDER — MONTELUKAST SODIUM 10 MG PO TABS
10.0000 mg | ORAL_TABLET | Freq: Every day | ORAL | 3 refills | Status: AC
Start: 2024-02-06 — End: ?

## 2024-02-06 MED ORDER — AMLODIPINE BESYLATE 2.5 MG PO TABS: 2.5000 mg | ORAL_TABLET | Freq: Every day | ORAL | 3 refills | Status: AC

## 2024-02-06 MED ORDER — LEVOCETIRIZINE DIHYDROCHLORIDE 5 MG PO TABS: 5.0000 mg | ORAL_TABLET | Freq: Every evening | ORAL | 3 refills | Status: AC

## 2024-02-06 MED ORDER — PANTOPRAZOLE SODIUM 40 MG PO TBEC: 40.0000 mg | DELAYED_RELEASE_TABLET | Freq: Every day | ORAL | 1 refills | Status: AC
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
## 2024-02-07 ENCOUNTER — Ambulatory Visit: Payer: Self-pay | Admitting: Family Medicine

## 2024-02-07 ENCOUNTER — Encounter: Payer: Self-pay | Admitting: Allergy & Immunology

## 2024-02-07 ENCOUNTER — Telehealth: Payer: Self-pay

## 2024-02-07 ENCOUNTER — Other Ambulatory Visit: Payer: Self-pay

## 2024-02-07 ENCOUNTER — Ambulatory Visit (INDEPENDENT_AMBULATORY_CARE_PROVIDER_SITE_OTHER): Payer: Medicare HMO | Admitting: Allergy & Immunology

## 2024-02-07 VITALS — BP 130/70 | HR 98 | Temp 98.1°F | Resp 18 | Ht 63.0 in | Wt 143.5 lb

## 2024-02-07 DIAGNOSIS — J3089 Other allergic rhinitis: Secondary | ICD-10-CM | POA: Diagnosis not present

## 2024-02-07 DIAGNOSIS — J4550 Severe persistent asthma, uncomplicated: Secondary | ICD-10-CM

## 2024-02-07 DIAGNOSIS — J984 Other disorders of lung: Secondary | ICD-10-CM | POA: Diagnosis not present

## 2024-02-07 DIAGNOSIS — J302 Other seasonal allergic rhinitis: Secondary | ICD-10-CM | POA: Diagnosis not present

## 2024-02-07 DIAGNOSIS — K219 Gastro-esophageal reflux disease without esophagitis: Secondary | ICD-10-CM | POA: Diagnosis not present

## 2024-02-07 LAB — CBC WITH DIFFERENTIAL/PLATELET
Basophils Absolute: 0.1 x10E3/uL (ref 0.0–0.2)
Basos: 1 %
EOS (ABSOLUTE): 0.1 x10E3/uL (ref 0.0–0.4)
Eos: 1 %
Hematocrit: 39.7 % (ref 34.0–46.6)
Hemoglobin: 13.4 g/dL (ref 11.1–15.9)
Immature Grans (Abs): 0 x10E3/uL (ref 0.0–0.1)
Immature Granulocytes: 0 %
Lymphocytes Absolute: 1.5 x10E3/uL (ref 0.7–3.1)
Lymphs: 25 %
MCH: 31.2 pg (ref 26.6–33.0)
MCHC: 33.8 g/dL (ref 31.5–35.7)
MCV: 93 fL (ref 79–97)
Monocytes Absolute: 0.4 x10E3/uL (ref 0.1–0.9)
Monocytes: 7 %
Neutrophils Absolute: 4 x10E3/uL (ref 1.4–7.0)
Neutrophils: 66 %
Platelets: 245 x10E3/uL (ref 150–450)
RBC: 4.29 x10E6/uL (ref 3.77–5.28)
RDW: 11.6 % — ABNORMAL LOW (ref 11.7–15.4)
WBC: 6.1 x10E3/uL (ref 3.4–10.8)

## 2024-02-07 LAB — CMP14+EGFR
ALT: 11 IU/L (ref 0–32)
AST: 13 IU/L (ref 0–40)
Albumin: 4.6 g/dL (ref 3.8–4.8)
Alkaline Phosphatase: 69 IU/L (ref 44–121)
BUN/Creatinine Ratio: 16 (ref 12–28)
BUN: 9 mg/dL (ref 8–27)
Bilirubin Total: 0.6 mg/dL (ref 0.0–1.2)
CO2: 21 mmol/L (ref 20–29)
Calcium: 9.6 mg/dL (ref 8.7–10.3)
Chloride: 97 mmol/L (ref 96–106)
Creatinine, Ser: 0.58 mg/dL (ref 0.57–1.00)
Globulin, Total: 1.8 g/dL (ref 1.5–4.5)
Glucose: 96 mg/dL (ref 70–99)
Potassium: 4 mmol/L (ref 3.5–5.2)
Sodium: 135 mmol/L (ref 134–144)
Total Protein: 6.4 g/dL (ref 6.0–8.5)
eGFR: 95 mL/min/1.73 (ref >59)

## 2024-02-07 LAB — LIPID PANEL
Cholesterol, Total: 211 mg/dL — AB (ref 100–199)
HDL: 77 mg/dL (ref >39)
LDL CALC COMMENT:: 2.7 ratio (ref 0.0–4.4)
LDL Chol Calc (NIH): 118 mg/dL — AB (ref 0–99)
Triglycerides: 94 mg/dL (ref 0–149)
VLDL Cholesterol Cal: 16 mg/dL (ref 5–40)

## 2024-02-07 LAB — TSH: TSH: 2.17 u[IU]/mL (ref 0.450–4.500)

## 2024-02-07 NOTE — Telephone Encounter (Signed)
 Contacted patient and gave her lab results.

## 2024-02-07 NOTE — Progress Notes (Unsigned)
 FOLLOW UP  Date of Service/Encounter:  02/07/24   Assessment:   Seasonal and perennial allergic rhinitis  Plan/Recommendations:   Patient Instructions  1. Chronic rhinitis (grasses, trees and indoor molds) - Continue with allergy injections per protocol.  - Continue with Zyrtec (cetirizine) 10mg  tablet once daily and Singulair  (montelukast ) 10mg  daily and Flonase  (fluticasone ) one spray per nostril daily  - Continue with nasal ipratropium every 8 hours as needed to help with drying out your nose.  - Continue with nasal saline rinses 1-2 times daily to remove allergens from the nasal cavities as well as help with mucous clearance (this is especially helpful to do before the nasal sprays are given)  - Clearly your living conditions were contributing to your symptoms. - I am sorry that you are experiencing this.   2. Mild persistent asthma, uncomplicated - Lung testing looks stable which is good news.  - Previous lab work has all been normal. - I will talk to Tammy to see what is going on.  - Daily controller medication(s): Breztri  two puffs twice daily + Singulair  (montelukast ) 10mg   - Prior to physical activity: albuterol  2 puffs 10-15 minutes before physical activity. - Rescue medications: albuterol  4 puffs every 4-6 hours as needed - Asthma control goals:  * Full participation in all desired activities (may need albuterol  before activity) * Albuterol  use two time or less a week on average (not counting use with activity) * Cough interfering with sleep two time or less a month * Oral steroids no more than once a year * No hospitalizations.  3. Return in about 6 months (around 08/09/2024). You can have the follow up appointment with Dr. Iva only.    Please inform us  of any Emergency Department visits, hospitalizations, or changes in symptoms. Call us  before going to the ED for breathing or allergy symptoms since we might be able to fit you in for a sick visit. Feel free to  contact us  anytime with any questions, problems, or concerns.  It was a pleasure to see you again today!  Websites that have reliable patient information: 1. American Academy of Asthma, Allergy, and Immunology: www.aaaai.org 2. Food Allergy Research and Education (FARE): foodallergy.org 3. Mothers of Asthmatics: http://www.asthmacommunitynetwork.org 4. American College of Allergy, Asthma, and Immunology: www.acaai.org      "Like" us  on Facebook and Instagram for our latest updates!      A healthy democracy works best when [employer] participate! Make sure you are registered to vote! If you have moved or changed any of your contact information, you will need to get this updated before voting! Scan the QR codes below to learn more!            Subjective:   Misty Clark is a 73 y.o. female presenting today for follow up of  Chief Complaint  Patient presents with   Asthma    Exertion causes shortness of breath and wheezing. Used emergency inhaler 1x in 2 weeks.   Allergic Rhinitis     Doing well. Found out that home she lived in was mold infested. She has moved out and moved somewhere else. She reports an improvement but still having a hard time breathing.     Misty Clark has a history of the following: Patient Active Problem List   Diagnosis Date Noted   Osteopenia of lumbar spine 12/29/2022   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   Seasonal and perennial allergic rhinitis 05/14/2020   Mixed hyperlipidemia 12/26/2017  Generalized anxiety disorder 06/03/2008   Essential hypertension, benign 06/03/2008   GERD 06/03/2008   Irritable bowel syndrome 06/03/2008   Nonspecific abnormal results of cardiovascular function study 06/03/2008    History obtained from: chart review and {Persons; PED relatives w/patient:19415::patient}.  Discussed the use of AI scribe software for clinical note transcription with the patient and/or guardian, who gave verbal  consent to proceed.  Misty Clark is a 73 y.o. female presenting for {Blank single:19197::a food challenge,a drug challenge,skin testing,a sick visit,an evaluation of ***,a follow up visit}.  Asthma/Respiratory Symptom History: ***  Allergic Rhinitis Symptom History: ***  [place] is on allergen immunotherapy. She receives two injections. Immunotherapy script #1 contains molds. She currently receives 0.50mL of the RED vial (1/100). Immunotherapy script #2 contains trees and grasses. She currently receives 0.50mL of the RED vial (1/100). She started shots November of 2023 and reached maintenance in August 2024.  She has not had any large local reactions.   Food Allergy Symptom History: ***  Skin Symptom History: ***  GERD Symptom History: ***  Infection Symptom History: ***  Otherwise, there have been no changes to her past medical history, surgical history, family history, or social history.    Review of systems otherwise negative other than that mentioned in the HPI.    Objective:   Blood pressure 130/70, pulse 98, temperature 98.1 F (36.7 C), temperature source Temporal, resp. rate 18, height 5' 3 (1.6 m), weight 143 lb 8 oz (65.1 kg), SpO2 96%. Body mass index is 25.42 kg/m.    Physical Exam   Diagnostic studies:    Spirometry: results abnormal (FEV1: 0.92/45%, FVC: 1.77/67%, FEV1/FVC: 52%).    Spirometry consistent with mixed obstructive and restrictive disease. {Blank single:19197::Albuterol /Atrovent  nebulizer,Xopenex/Atrovent  nebulizer,Albuterol  nebulizer,Albuterol  four puffs via MDI,Xopenex four puffs via MDI} treatment given in clinic with {Blank single:19197::significant improvement in FEV1 per ATS criteria,significant improvement in FVC per ATS criteria,significant improvement in FEV1 and FVC per ATS criteria,improvement in FEV1, but not significant per ATS criteria,improvement in FVC, but not significant per ATS criteria,improvement in  FEV1 and FVC, but not significant per ATS criteria,no improvement}.  Allergy Studies: {Blank single:19197::none,deferred due to recent antihistamine use,deferred due to insurance stipulations that require a separate visit for testing,labs sent instead, }    {Blank single:19197::Allergy testing results were read and interpreted by myself, documented by clinical staff., }      Marty Shaggy, MD  [facility] 

## 2024-02-07 NOTE — Patient Instructions (Addendum)
 1. Chronic rhinitis (grasses, trees and indoor molds) - Continue with allergy injections per protocol.  - Continue with Zyrtec (cetirizine) 10mg  tablet once daily and Singulair  (montelukast ) 10mg  daily and Flonase  (fluticasone ) one spray per nostril daily  - Continue with nasal ipratropium every 8 hours as needed to help with drying out your nose.  - Continue with nasal saline rinses 1-2 times daily to remove allergens from the nasal cavities as well as help with mucous clearance (this is especially helpful to do before the nasal sprays are given)  - Clearly your living conditions were contributing to your symptoms. - I am sorry that you are experiencing this.   2. Mild persistent asthma, uncomplicated - Lung testing looks stable which is good news.  - Previous lab work has all been normal. - I will talk to Tammy to see what is going on.  - Daily controller medication(s): Breztri  two puffs twice daily + Singulair  (montelukast ) 10mg   - Prior to physical activity: albuterol  2 puffs 10-15 minutes before physical activity. - Rescue medications: albuterol  4 puffs every 4-6 hours as needed - Asthma control goals:  * Full participation in all desired activities (may need albuterol  before activity) * Albuterol  use two time or less a week on average (not counting use with activity) * Cough interfering with sleep two time or less a month * Oral steroids no more than once a year * No hospitalizations.  3. Return in about 6 months (around 08/09/2024). You can have the follow up appointment with Dr. Iva only.    Please inform us  of any Emergency Department visits, hospitalizations, or changes in symptoms. Call us  before going to the ED for breathing or allergy symptoms since we might be able to fit you in for a sick visit. Feel free to contact us  anytime with any questions, problems, or concerns.  It was a pleasure to see you again today!  Websites that have reliable patient information: 1.  American Academy of Asthma, Allergy, and Immunology: www.aaaai.org 2. Food Allergy Research and Education (FARE): foodallergy.org 3. Mothers of Asthmatics: http://www.asthmacommunitynetwork.org 4. American College of Allergy, Asthma, and Immunology: www.acaai.org      "Like" us  on Facebook and Instagram for our latest updates!      A healthy democracy works best when [employer] participate! Make sure you are registered to vote! If you have moved or changed any of your contact information, you will need to get this updated before voting! Scan the QR codes below to learn more!

## 2024-02-07 NOTE — Telephone Encounter (Signed)
 Copied from CRM #[id number]. Topic: Clinical - Lab/Test Results >> Feb 07, 2024  3:19 PM Elie BRAVO wrote: Reason for CRM: patient returning missed call regarding labs, patient would like to speak with Suzen

## 2024-02-10 ENCOUNTER — Other Ambulatory Visit: Payer: Self-pay | Admitting: Family Medicine

## 2024-02-21 ENCOUNTER — Ambulatory Visit (INDEPENDENT_AMBULATORY_CARE_PROVIDER_SITE_OTHER)

## 2024-02-21 DIAGNOSIS — J309 Allergic rhinitis, unspecified: Secondary | ICD-10-CM

## 2024-02-23 ENCOUNTER — Ambulatory Visit

## 2024-02-23 ENCOUNTER — Other Ambulatory Visit: Payer: Self-pay | Admitting: Family Medicine

## 2024-02-23 DIAGNOSIS — J302 Other seasonal allergic rhinitis: Secondary | ICD-10-CM

## 2024-02-29 ENCOUNTER — Ambulatory Visit (INDEPENDENT_AMBULATORY_CARE_PROVIDER_SITE_OTHER)

## 2024-02-29 DIAGNOSIS — J309 Allergic rhinitis, unspecified: Secondary | ICD-10-CM

## 2024-03-06 ENCOUNTER — Ambulatory Visit (INDEPENDENT_AMBULATORY_CARE_PROVIDER_SITE_OTHER)

## 2024-03-06 DIAGNOSIS — J309 Allergic rhinitis, unspecified: Secondary | ICD-10-CM

## 2024-03-15 ENCOUNTER — Ambulatory Visit (INDEPENDENT_AMBULATORY_CARE_PROVIDER_SITE_OTHER)

## 2024-03-15 DIAGNOSIS — J309 Allergic rhinitis, unspecified: Secondary | ICD-10-CM

## 2024-03-28 ENCOUNTER — Ambulatory Visit (INDEPENDENT_AMBULATORY_CARE_PROVIDER_SITE_OTHER)

## 2024-03-28 DIAGNOSIS — J309 Allergic rhinitis, unspecified: Secondary | ICD-10-CM

## 2024-04-03 ENCOUNTER — Ambulatory Visit (INDEPENDENT_AMBULATORY_CARE_PROVIDER_SITE_OTHER)

## 2024-04-03 DIAGNOSIS — J309 Allergic rhinitis, unspecified: Secondary | ICD-10-CM | POA: Diagnosis not present

## 2024-04-11 DIAGNOSIS — J301 Allergic rhinitis due to pollen: Secondary | ICD-10-CM | POA: Diagnosis not present

## 2024-04-11 NOTE — Progress Notes (Signed)
 VIALS MADE 04-11-24

## 2024-04-12 DIAGNOSIS — J302 Other seasonal allergic rhinitis: Secondary | ICD-10-CM | POA: Diagnosis not present

## 2024-04-13 ENCOUNTER — Ambulatory Visit: Payer: Self-pay

## 2024-04-13 ENCOUNTER — Telehealth (INDEPENDENT_AMBULATORY_CARE_PROVIDER_SITE_OTHER): Admitting: Family Medicine

## 2024-04-13 ENCOUNTER — Encounter: Payer: Self-pay | Admitting: Family Medicine

## 2024-04-13 DIAGNOSIS — R300 Dysuria: Secondary | ICD-10-CM

## 2024-04-13 DIAGNOSIS — R3989 Other symptoms and signs involving the genitourinary system: Secondary | ICD-10-CM

## 2024-04-13 MED ORDER — CEPHALEXIN 500 MG PO CAPS: 500.0000 mg | ORAL_CAPSULE | Freq: Two times a day (BID) | ORAL | 0 refills | Status: AC

## 2024-04-13 NOTE — Telephone Encounter (Signed)
 FYI Only or Action Required?: Action required by provider: update on patient condition and requesting medication for urinary sx.  Patient was last seen in primary care on 02/06/2024 by Jolinda Norene HERO, DO.  Called Nurse Triage reporting Urinary Frequency.  Symptoms began several days ago. 3 days ago   Interventions attempted: Rest, hydration, or home remedies.  Symptoms are: gradually worsening.  Triage Disposition: See Physician Within 24 Hours  Patient/caregiver understands and will follow disposition?: No, wishes to speak with PCP   Recommended OV and patient does not want to be around sick people due to hx asthma. Requesting call back . Recommended if sx worsen go to UC.             Reason for Disposition  Age > 50 years  Answer Assessment - Initial Assessment Questions Patient requesting something to be called in  for urinary sx. Reports hx UTis. Patient reports PCP aware . Patient does not want to come in for appt due to hx asthma and being around sick people. Recommended appt and patient declined at this time. Requesting call back.     1. SEVERITY: How bad is the pain?  (e.g., Scale 1-10; mild, moderate, or severe)     Moderate  2. FREQUENCY: How many times have you had painful urination today?      Na  3. PATTERN: Is pain present every time you urinate or just sometimes?      Na  4. ONSET: When did the painful urination start?      3 days ago  5. FEVER: Do you have a fever? If Yes, ask: What is your temperature, how was it measured, and when did it start?     No  but feels pain all over 6. PAST UTI: Have you had a urine infection before? If Yes, ask: When was the last time? and What happened that time?      Yes  7. CAUSE: What do you think is causing the painful urination?  (e.g., UTI, scratch, Herpes sore)     UTI  8. OTHER SYMPTOMS: Do you have any other symptoms? (e.g., blood in urine, flank pain, genital sores, urgency,  vaginal discharge)     Low abdominal pressure. Urinary frequency, dribbles at times, cloudy urine 9. PREGNANCY: Is there any chance you are pregnant? When was your last menstrual period?     na  Protocols used: Urination Pain - Female-A-AH

## 2024-04-13 NOTE — Telephone Encounter (Signed)
 This RN attempted to reach patient, left message with callback number.   Message from [place] C sent at 04/13/2024  9:36 AM EDT  Summary: urinary discomfort / rx req   Reason for Triage: The patient shares that they are currently experiencing urinary discomfort and would like to be contacted by a member of staff to discuss their options for a prescription to relieve their concerns.

## 2024-04-13 NOTE — Telephone Encounter (Signed)
 Spoke with patient and got her scheduled to have a video visit today with on call provider.

## 2024-04-13 NOTE — Progress Notes (Signed)
 Virtual Visit via Video   I connected with patient on 04/13/24 at 1540 by a video enabled telemedicine application and verified that I am speaking with the correct person using two identifiers.  Location patient: Home Location provider: [facility] Persons participating in the virtual visit: Patient and Provider  I discussed the limitations of evaluation and management by telemedicine and the availability of in person appointments. The patient expressed understanding and agreed to proceed.  Subjective:   HPI:  Pt presents today for  Chief Complaint  Patient presents with   Urinary Frequency    Misty Clark is a 73 year old female who presents with symptoms suggestive of a urinary tract infection.  She experiences increased frequency of urination with small amounts each time, accompanied by lower abdominal discomfort and pain. Her urine appears cloudy.  She has a history of urinary tract infections, although she cannot recall the last occurrence. No fever, confusion, or new onset flank pain. 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  She has a known allergy to sulfa drugs, which previously caused a rash.      ROS per HPI   Patient Active Problem List   Diagnosis Date Noted   Osteopenia of lumbar spine 12/29/2022   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   Seasonal and perennial allergic rhinitis 05/14/2020   Mixed hyperlipidemia 12/26/2017   Generalized anxiety disorder 06/03/2008   Essential hypertension, benign 06/03/2008   GERD 06/03/2008   Irritable bowel syndrome 06/03/2008   Nonspecific abnormal results of cardiovascular function study 06/03/2008    Social History   Tobacco Use   Smoking status: Former    Current packs/day: 0.50    Average packs/day: 0.5 packs/day for 3.0 years (1.5 ttl pk-yrs)    Types: Cigarettes   Smokeless tobacco: Never   Tobacco comments:    Quit many  years ago.  Substance Use Topics   Alcohol use: No    Current Outpatient Medications:    cephALEXin  (KEFLEX ) 500 MG capsule, Take 1 capsule (500 mg total) by mouth 2 (two) times daily for 7 days., Disp: 14 capsule, Rfl: 0   albuterol  (VENTOLIN  HFA) 108 (90 Base) MCG/ACT inhaler, INHALE 2 PUFFS INTO LUNGS EVERY 6 HOURS AS NEEDED FOR WHEEZING AND FOR SHORTNESS OF BREATH, Disp: 9 g, Rfl: 5   ALPRAZolam  (XANAX ) 0.5 MG tablet, Take 1 tablet (0.5 mg total) by mouth daily as needed for anxiety., Disp: 20 tablet, Rfl: 1   amLODipine  (NORVASC ) 2.5 MG tablet, Take 1 tablet (2.5 mg total) by mouth daily. For high blood pressure, Disp: 90 tablet, Rfl: 3   atorvastatin  (LIPITOR) 40 MG tablet, Take 1 tablet (40 mg total) by mouth daily., Disp: 90 tablet, Rfl: 3   Budeson-Glycopyrrol-Formoterol (BREZTRI  AEROSPHERE) 160-9-4.8 MCG/ACT AERO, Inhale 2 puffs into the lungs in the morning and at bedtime., Disp: , Rfl:    citalopram  (CELEXA ) 20 MG tablet, TAKE 1 TABLET BY MOUTH ONCE DAILY ., Disp: 90 tablet, Rfl: 3   EPINEPHrine  0.3 mg/0.3 mL IJ SOAJ injection, Inject 0.3 mg into the muscle as needed for anaphylaxis. As needed for life-threatening allergic reactions, Disp: 2 each, Rfl: 1   famotidine  (PEPCID ) 40 MG tablet, Take 1 tablet (40 mg total) by mouth daily., Disp: 90 tablet, Rfl: 1   fluticasone  (FLONASE ) 50 MCG/ACT nasal spray, Place 2 sprays into both nostrils daily., Disp: 16 g, Rfl: 6   hydrochlorothiazide  (HYDRODIURIL ) 25 MG tablet,  Take 0.5-1 tablets (12.5-25 mg total) by mouth daily as needed (swelling)., Disp: 90 tablet, Rfl: 3   ipratropium (ATROVENT ) 0.06 % nasal spray, USE 1 SPRAY(S) IN EACH NOSTRIL THREE TIMES DAILY (EVERY 8 HOURS) AS NEEDED TO HELP WITH DRYING OUT YOUR NOSE., Disp: 45 mL, Rfl: 1   levocetirizine (XYZAL ) 5 MG tablet, Take 1 tablet (5 mg total) by mouth every evening., Disp: 90 tablet, Rfl: 3   meclizine  (ANTIVERT ) 12.5 MG tablet, Take 1 tablet (12.5 mg total) by mouth 3 (three)  times daily as needed for dizziness., Disp: 30 tablet, Rfl: 1   montelukast  (SINGULAIR ) 10 MG tablet, Take 1 tablet (10 mg total) by mouth at bedtime., Disp: 90 tablet, Rfl: 3   pantoprazole  (PROTONIX ) 40 MG tablet, Take 1 tablet (40 mg total) by mouth daily., Disp: 90 tablet, Rfl: 1   Vitamin D , Ergocalciferol , (DRISDOL ) 1.25 MG (50000 UNIT) CAPS capsule, Take 1 capsule (50,000 Units total) by mouth every 7 (seven) days., Disp: 12 capsule, Rfl: 3  Allergies  Allergen Reactions   Codeine     REACTION: vomiting   Sulfonamide Derivatives     REACTION: rash    Objective:   There were no vitals taken for this visit.  Patient is well-developed, well-nourished in no acute distress.  Resting comfortably at home.  Head is normocephalic, atraumatic.  No labored breathing.  Speech is clear and coherent with logical content.  Patient is alert and oriented at baseline.    Assessment and Plan:   [name] was seen today for urinary frequency.  Diagnoses and all orders for this visit:  Dysuria -     Urine Culture -     Urinalysis, Routine w reflex microscopic  Suspected UTI -     cephALEXin  (KEFLEX ) 500 MG capsule; Take 1 capsule (500 mg total) by mouth 2 (two) times daily for 7 days.      Suspected Urinary tract infection Symptoms consistent with a urinary tract infection, including urinary frequency, lower abdominal discomfort, and cloudy urine. No fever or flank pain. Sulfa allergy with rash reaction. - Prescribe Keflex  (cephalexin ) 500 mg twice a day for 7 days. - Advise to bring a urine sample for culture on Monday or if symptoms do not improve in 2-3 days. - Monitor for worsening or new symptoms and report if she occurs.          Return if symptoms worsen or fail to improve.  Misty Bruns, FNP-C [facility] [address] [place], [place] [address] [phone]  04/13/2024  Time spent with the patient: 11 minutes, of which >50% was spent in  obtaining information about symptoms, reviewing previous labs, evaluations, and treatments, counseling about condition (please see the discussed topics above), and developing a plan to further investigate it; had a number of questions which I addressed.

## 2024-04-27 ENCOUNTER — Ambulatory Visit (INDEPENDENT_AMBULATORY_CARE_PROVIDER_SITE_OTHER): Admitting: Family Medicine

## 2024-04-27 ENCOUNTER — Encounter: Payer: Self-pay | Admitting: Family Medicine

## 2024-04-27 VITALS — BP 160/80 | HR 89 | Temp 98.0°F | Ht 65.0 in | Wt 143.2 lb

## 2024-04-27 DIAGNOSIS — J329 Chronic sinusitis, unspecified: Secondary | ICD-10-CM | POA: Diagnosis not present

## 2024-04-27 DIAGNOSIS — J4489 Other specified chronic obstructive pulmonary disease: Secondary | ICD-10-CM

## 2024-04-27 LAB — VERITOR FLU A/B WAIVED
Influenza A: NEGATIVE
Influenza B: NEGATIVE

## 2024-04-27 MED ORDER — AZITHROMYCIN 250 MG PO TABS
ORAL_TABLET | ORAL | 0 refills | Status: AC
Start: 2024-04-27 — End: ?

## 2024-04-27 MED ORDER — TRELEGY ELLIPTA 200-62.5-25 MCG/ACT IN AEPB: 1.0000 | INHALATION_SPRAY | Freq: Every day | RESPIRATORY_TRACT | 3 refills | Status: AC

## 2024-04-27 MED ORDER — BENZONATATE 100 MG PO CAPS
100.0000 mg | ORAL_CAPSULE | Freq: Three times a day (TID) | ORAL | 0 refills | Status: AC | PRN
Start: 2024-04-27 — End: ?

## 2024-04-27 NOTE — Progress Notes (Signed)
 Subjective: CC: Sinusitis  PCP: Jolinda Norene HERO, DO YEP:Inwwj Misty Clark is a 73 y.o. female presenting to clinic today for:  Sinusitis started about a week ago.  She reports its progressively gotten worse with bilateral ear fullness, sore throat and facial pain.  She reports cough without hemoptysis.  No increased shortness of breath or wheezing but she notes that she has been out of her Breztri  for a bit now.  She is under patient assistance program and that was resubmitted by her specialist but she has not heard anything about receiving any new supply.  She actually felt better on the Trelegy even though it cost more so she was thinking about going back to that if able.  She is been utilizing her albuterol  inhaler as needed, Flonase , Xyzal , Singulair  but symptoms are refractory.  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 For high blood pressure, Disp: 90 tablet, Rfl: 3   atorvastatin  (LIPITOR) 40 MG tablet, Take 1 tablet (40 mg total) by mouth daily., Disp: 90 tablet, Rfl: 3   Budeson-Glycopyrrol-Formoterol (BREZTRI  AEROSPHERE) 160-9-4.8 MCG/ACT AERO, Inhale 2 puffs into the lungs in the morning and at  bedtime., Disp: , Rfl:    citalopram  (CELEXA ) 20 MG tablet, TAKE 1 TABLET BY MOUTH ONCE DAILY ., Disp: 90 tablet, Rfl: 3   EPINEPHrine  0.3 mg/0.3 mL IJ SOAJ injection, Inject 0.3 mg into the muscle as needed for anaphylaxis. As needed for life-threatening allergic reactions, Disp: 2 each, Rfl: 1   famotidine  (PEPCID ) 40 MG tablet, Take 1 tablet (40 mg total) by mouth daily., Disp: 90 tablet, Rfl: 1   fluticasone  (FLONASE ) 50 MCG/ACT nasal spray, Place 2 sprays into both nostrils daily., Disp: 16 g, Rfl: 6   hydrochlorothiazide  (HYDRODIURIL ) 25 MG tablet, Take 0.5-1 tablets (12.5-25 mg total) by mouth daily as needed (swelling)., Disp: 90 tablet, Rfl: 3   ipratropium (ATROVENT ) 0.06 % nasal spray, USE 1 SPRAY(S) IN EACH NOSTRIL THREE TIMES DAILY (EVERY 8 HOURS) AS NEEDED TO HELP WITH DRYING OUT YOUR NOSE., Disp: 45 mL, Rfl: 1   levocetirizine (XYZAL ) 5 MG tablet, Take 1 tablet (5 mg total) by mouth every evening., Disp: 90 tablet, Rfl: 3   meclizine  (ANTIVERT ) 12.5 MG tablet, Take 1 tablet (12.5 mg total) by mouth 3 (three) times daily as needed for dizziness., Disp: 30 tablet, Rfl: 1   montelukast  (SINGULAIR ) 10 MG tablet, Take 1 tablet (10 mg total) by mouth at bedtime., Disp: 90 tablet, Rfl: 3   pantoprazole  (PROTONIX ) 40 MG tablet, Take 1 tablet (40 mg  total) by mouth daily., Disp: 90 tablet, Rfl: 1   Vitamin D , Ergocalciferol , (DRISDOL ) 1.25 MG (50000 UNIT) CAPS capsule, Take 1 capsule (50,000 Units total) by mouth every 7 (seven) days., Disp: 12 capsule, Rfl: 3 Social History   Socioeconomic History   Marital status: Widowed    Spouse name: Not on file   Number of children: 4   Years of education: Not on file   Highest education level: Some college, no degree  Occupational History   Occupation: Retired Nursing    Comment: [employer] [facility]/ HH  Tobacco Use   Smoking status: Former    Current packs/day: 0.50    Average packs/day: 0.5 packs/day for 3.0 years (1.5 ttl pk-yrs)    Types:  Cigarettes   Smokeless tobacco: Never   Tobacco comments:    Quit many years ago.  Vaping Use   Vaping status: Never Used  Substance and Sexual Activity   Alcohol use: No   Drug use: Never   Sexual activity: Not on file  Other Topics Concern   Not on file  Social History Narrative   Retired Arts administrator. 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 Lives alone. Enjoys crafting, puzzles, reading, and gardening.    Social Drivers of Health   Financial Resource Strain: Medium Risk (10/04/2023)   Overall Financial Resource Strain (CARDIA)    Difficulty of Paying Living Expenses: Somewhat hard  Food Insecurity: No Food Insecurity (10/04/2023)   Hunger Vital Sign    Worried About Running Out of Food in the Last Year: Never true    Ran Out of Food in the Last Year: Never true  Transportation Needs: No Transportation Needs (10/04/2023)   PRAPARE - Administrator, Civil Service (Medical): No    Lack of Transportation (Non-Medical): No  Physical Activity: Insufficiently Active (10/04/2023)   Exercise Vital Sign    Days of Exercise per Week: 3 days    Minutes of Exercise per Session: 30 min  Stress: No Stress Concern Present (10/04/2023)   [employer] of Occupational Health - Occupational Stress Questionnaire    Feeling of Stress : Only a little  Social Connections: Moderately Isolated (10/04/2023)   Social Connection and Isolation Panel    Frequency of Communication with Friends and Family: More than three times a week    Frequency of Social Gatherings with Friends and Family: More than three times a week    Attends Religious Services: More than 4 times per year    Active Member of [employer] or Organizations: No    Attends Banker Meetings: Never    Marital Status: Widowed  Intimate Partner Violence: Not At Risk (10/04/2023)   Humiliation, Afraid, Rape, and Kick questionnaire    Fear of Current or Ex-Partner: No    Emotionally Abused: No    Physically Abused: No     Sexually Abused: No   Family History  Problem Relation Age of Onset   Asthma Mother    Thyroid  disease Mother    Heart disease Father    Breast cancer Sister    Hypertension Daughter    Heart failure Maternal Grandmother        CHF   Heart attack Paternal Grandfather        MI   Coronary artery disease Paternal Grandfather        At a premature age   Coronary artery disease Other    Colon cancer Other    Prostate cancer Other    Ovarian cancer  Other    Allergic rhinitis Neg Hx    Angioedema Neg Hx    Atopy Neg Hx    Eczema Neg Hx    Immunodeficiency Neg Hx    Urticaria Neg Hx     Objective: Office vital signs reviewed. BP (!) 160/80   Pulse 89   Temp 98 F (36.7 C)   Ht 5' 5 (1.651 m)   Wt 143 lb 4 oz (65 kg)   SpO2 97%   BMI 23.84 kg/m   Physical Examination:  General: Awake, alert, well nourished, No acute distress HEENT: Normal    Neck: No masses palpated. No lymphadenopathy    Ears: Tympanic membranes intact, normal light reflex, no erythema, no bulging    Eyes: PERRLA, extraocular membranes intact, sclera white    Nose: nasal turbinates moist, clear nasal discharge    Throat: moist mucus membranes, no erythema, no tonsillar exudate.  Airway is patent Cardio: regular rate and rhythm, S1S2 heard, no murmurs appreciated Pulm: Globally decreased breath sounds with normal work of breathing on room air.  No wheezes, rhonchi or rails.  Assessment/ Plan: 73 y.o. female   Rhinosinusitis - Plan: Novel Coronavirus, NAA ([facility]), Veritor Flu A/B Waived, azithromycin  (ZITHROMAX ) 250 MG tablet, benzonatate  (TESSALON  PERLES) 100 MG capsule  Asthma-COPD overlap syndrome (HCC) - Plan: Fluticasone -Umeclidin-Vilant (TRELEGY ELLIPTA ) 200-62.5-25 MCG/ACT AEPB, benzonatate  (TESSALON  PERLES) 100 MG capsule   Rapid flu negative.  COVID sent.  Handwritten prescription for Z-Pak given if needed but we discussed signs and symptoms warranting the Rx.  At this time I think this is  viral.  Tessalon  Perles sent.  Okay to continue home medications.  I have reordered Trelegy for her but given her sample of Breztri    Norene CHRISTELLA Fielding, DO [facility] [phone]

## 2024-04-28 LAB — NOVEL CORONAVIRUS, NAA: SARS-CoV-2, NAA: NOT DETECTED

## 2024-04-30 ENCOUNTER — Ambulatory Visit: Payer: Self-pay | Admitting: Family Medicine

## 2024-05-14 ENCOUNTER — Other Ambulatory Visit: Payer: Self-pay | Admitting: Allergy & Immunology

## 2024-05-17 ENCOUNTER — Ambulatory Visit: Admitting: Family Medicine

## 2024-05-17 ENCOUNTER — Ambulatory Visit: Payer: Self-pay

## 2024-05-17 NOTE — Telephone Encounter (Signed)
 FYI Only or Action Required?: FYI only for provider: appointment scheduled on 05/18/24.  Patient was last seen in primary care on 04/27/2024 by Jolinda Norene HERO, DO.  Called Nurse Triage reporting Facial Pain and Ear Fullness.  Symptoms began several weeks ago.  Interventions attempted: Prescription medications: z-pak and Rest, hydration, or home remedies.  Symptoms are: unchanged.  Triage Disposition: See PCP When Office is Open (Within 3 Days)  Patient/caregiver understands and will follow disposition?: Yes Reason for Disposition  [1] Sinus congestion (pressure, fullness) AND [2] present > 10 days  Answer Assessment - Initial Assessment Questions Taken z-pak that was prescribed and not feeling better. Had an Allergist appointment today but refused to be seen by the PA because patient did not have copay. Patient wanting to schedule with PCP.   1. LOCATION: Where does it hurt?      From both cheekbones up to eyes  2. ONSET: When did the sinus pain start?  (e.g., hours, days)      Quite a few weeks  3. SEVERITY: How bad is the pain?   (Scale 0-10; or none, mild, moderate or severe)     Moderate  4. NASAL CONGESTION: Is the nose blocked? If Yes, ask: Can you open it or must you breathe through your mouth?     Yes  6. NASAL DISCHARGE: Do you have discharge from your nose? If so ask, What color?     Greenish with some blood mixed in  7. FEVER: Do you have a fever? If Yes, ask: What is it, how was it measured, and when did it start?      Off and on at night, 101 2 nights ago  8. OTHER SYMPTOMS: Do you have any other symptoms? (e.g., sore throat, cough, earache, difficulty breathing)     Bilateral ear pain and fullness, coughing  Protocols used: Sinus Pain or Congestion-A-AH  Copied from CRM #[id number]. Topic: Clinical - Red Word Triage >> May 17, 2024 10:22 AM Willma R wrote: [facility] that prompted transfer to Nurse Triage: Patient is having a problem  with her sinus's, still has ear pain, soreness in her throat and facial pain.

## 2024-05-17 NOTE — Telephone Encounter (Signed)
Noted  -LS

## 2024-05-17 NOTE — Progress Notes (Deleted)
   [address] [place] [address] Dept: [phone]  FOLLOW UP NOTE  Patient ID: Milanya Sunderland, female    DOB: 1950/07/15  Age: 73 y.o. MRN: [id number] Date of Office Visit: 05/17/2024  Assessment  Chief Complaint: No chief complaint on file.  HPI Fotini Lemus is a 73 year old female who presents to the clinic for follow-up visit.  She was last seen in this clinic on 02/07/2024 by Dr. Iva for evaluation of asthma COPD overlap syndrome, restrictive lung disease not responsive to prednisone , allergic rhinitis on allergen immunotherapy directed toward grass pollen, and mold.  She received her last allergen immunotherapy on 04/03/2024 Discussed the use of AI scribe software for clinical note transcription with the patient, who gave verbal consent to proceed.  History of Present Illness      Drug Allergies:  Allergies  Allergen Reactions   Codeine     REACTION: vomiting   Sulfonamide Derivatives     REACTION: rash    Physical Exam: There were no vitals taken for this visit.   Physical Exam  Diagnostics:    Assessment and Plan: No diagnosis found.  No orders of the defined types were placed in this encounter.   There are no Patient Instructions on file for this visit.  No follow-ups on file.    Thank you for the opportunity to care for this patient.  Please do not hesitate to contact me with questions.  Arlean Mutter, FNP [facility]

## 2024-05-17 NOTE — Patient Instructions (Incomplete)
 Asthma Continue Breztri  2 puffs twice a day with a spacer to prevent cough or wheeze Continue montelukast  10 mg once a day to prevent cough or wheeze Continue albuterol  2 puffs once every 4 hours if needed for cough or wheeze You may use albuterol  2 puffs 5-15 minutes before activity to decrease cough or wheeze  Continue tezspire  injections once every 28 days for asthma control  Allergic rhinitis Continue allergen avoidance measures directed toward grass pollen, tree pollen and indoor mold as listed below Continue cetirizine 10 mg once a day if needed for a runny nose Continue Flonase  2 sprays in each nostril up to twice a day if needed for stuffy nose Continue ipratropium 2 sprays in each nostril up to 3 times a day if needed for runny nose Consider saline nasal rinses as needed for nasal symptoms. Use this before any medicated nasal sprays for best result Continue allergen immunotherapy and have access to an epinephrine  autoinjector set per protocol  Call the clinic if this treatment plan is not working well for you.  Follow up in *** or sooner if needed.  Reducing Pollen Exposure The American Academy of Allergy, Asthma and Immunology suggests the following steps to reduce your exposure to pollen during allergy seasons. Do not hang sheets or clothing out to dry; pollen may collect on these items. Do not mow lawns or spend time around freshly cut grass; mowing stirs up pollen. Keep windows closed at night.  Keep car windows closed while driving. Minimize morning activities outdoors, a time when pollen counts are usually at their highest. Stay indoors as much as possible when pollen counts or humidity is high and on windy days when pollen tends to remain in the air longer. Use air conditioning when possible.  Many air conditioners have filters that trap the pollen spores. Use a HEPA room air filter to remove pollen form the indoor air you breathe.  .  AControl of Mold Allergen Mold and  fungi can grow on a variety of surfaces provided certain temperature and moisture conditions exist.  Outdoor molds grow on plants, decaying vegetation and soil.  The major outdoor mold, Alternaria and Cladosporium, are found in very high numbers during hot and dry conditions.  Generally, a late Summer - Fall peak is seen for common outdoor fungal spores.  Rain will temporarily lower outdoor mold spore count, but counts rise rapidly when the rainy period ends.  The most important indoor molds are Aspergillus and Penicillium.  Dark, humid and poorly ventilated basements are ideal sites for mold growth.  The next most common sites of mold growth are the bathroom and the kitchen.  Outdoor [employer] Use air conditioning and keep windows closed Avoid exposure to decaying vegetation. Avoid leaf raking. Avoid grain handling. Consider wearing a face mask if working in moldy areas.  Indoor Mold Control Maintain humidity below 50%. Clean washable surfaces with 5% bleach solution. Remove sources e.g. Contaminated carpets.

## 2024-05-18 ENCOUNTER — Ambulatory Visit (INDEPENDENT_AMBULATORY_CARE_PROVIDER_SITE_OTHER): Admitting: Family Medicine

## 2024-05-18 ENCOUNTER — Ambulatory Visit

## 2024-05-18 VITALS — BP 147/85 | HR 90 | Temp 98.2°F | Ht 65.0 in | Wt 141.0 lb

## 2024-05-18 DIAGNOSIS — J019 Acute sinusitis, unspecified: Secondary | ICD-10-CM | POA: Diagnosis not present

## 2024-05-18 DIAGNOSIS — B9689 Other specified bacterial agents as the cause of diseases classified elsewhere: Secondary | ICD-10-CM

## 2024-05-18 DIAGNOSIS — J4489 Other specified chronic obstructive pulmonary disease: Secondary | ICD-10-CM | POA: Diagnosis not present

## 2024-05-18 MED ORDER — AMOXICILLIN-POT CLAVULANATE 875-125 MG PO TABS: 1.0000 | ORAL_TABLET | Freq: Two times a day (BID) | ORAL | 0 refills | Status: AC

## 2024-05-18 NOTE — Patient Instructions (Signed)
 Follow up if symptoms acutely worsen, no improvement over the next 2-3 days, fever develops, or for any other concerns

## 2024-05-18 NOTE — Progress Notes (Signed)
 Acute Office Visit  Patient ID: Misty Clark, female    DOB: September 26, 1950, 73 y.o.   MRN: [id number]  PCP: Jolinda Norene HERO, DO  Chief Complaint  Patient presents with   Follow-up    Patient reports that she has had a sinus infection for about a month. Was prescribed a Zpak recently and has completed course of medication but still reports having all of the same symptoms (drainage, ear and facial pressure, and cough)    Subjective:     HPI  Discussed the use of AI scribe software for clinical note transcription with the patient, who gave verbal consent to proceed.  History of Present Illness   Misty Clark is a 73 year old female who presents with worsening sinus symptoms.  Sinus congestion and nasal symptoms - Was seen office on April 27, 2024 for sinus symptoms x 1 week.  Notes state that symptoms were likely viral rhinosinusitis at that time but instructed to start zpak if symptoms worsen  - Reports having bilat ear pressure - Facial pain present - Yellow nasal drainage - Having blood in nasal drainage - Temporary relief with saline nasal spray - Reports that sinus symptoms have worsened since onset. - Patient main concern is her sinus drainage and facial pain  Fever - Reports subjective fever a couple times at night during course of illness  Cough and sputum production - No wheezing - Has coughed up phlegm   Allergic rhinitis and environmental allergies - History of allergies, primarily to leaves and grass - Previously received allergy injections, not recently  Hx of asthma/copd - uses trelegy   Antibiotic use and drug allergies - Completed Z-Pak course started two days after April 27, 2024 visit, with worsening symptoms       ROS     Objective:    BP (!) 147/85   Pulse 90   Temp 98.2 F (36.8 C)   Ht 5' 5 (1.651 m)   Wt 141 lb (64 kg)   SpO2 97%   BMI 23.46 kg/m    Physical Exam Vitals reviewed.  Constitutional:      Appearance:  Normal appearance.  HENT:     Head: Normocephalic and atraumatic.     Comments: Pain with palpation of frontal and maxillary sinuses    Right Ear: Tympanic membrane, ear canal and external ear normal.     Left Ear: Tympanic membrane, ear canal and external ear normal.     Nose: Nose normal.     Mouth/Throat:     Mouth: Mucous membranes are moist.     Pharynx: Oropharynx is clear.     Comments: PND observed to the posterior pharynx Eyes:     Extraocular Movements: Extraocular movements intact.     Conjunctiva/sclera: Conjunctivae normal.     Pupils: Pupils are equal, round, and reactive to light.  Cardiovascular:     Rate and Rhythm: Normal rate and regular rhythm.     Pulses: Normal pulses.     Heart sounds: Normal heart sounds.  Pulmonary:     Effort: Pulmonary effort is normal. No respiratory distress.     Breath sounds: Normal breath sounds. No wheezing, rhonchi or rales.     Comments: Breath sounds globally decreased but clear Musculoskeletal:        General: Normal range of motion.     Cervical back: Normal range of motion.  Skin:    General: Skin is warm and dry.  Neurological:  General: No focal deficit present.     Mental Status: She is alert and oriented to person, place, and time.  Psychiatric:        Mood and Affect: Mood normal.        Behavior: Behavior normal.       No results found for any visits on 05/18/24.     Assessment & Plan:   Problem List Items Addressed This Visit       Respiratory   Asthma-COPD overlap syndrome (HCC)   Other Visit Diagnoses       Acute bacterial sinusitis    -  Primary   Relevant Medications   amoxicillin -clavulanate (AUGMENTIN ) 875-125 MG tablet       Assessment and Plan    Acute bacterial sinusitis  Worsening symptoms despite azithromycin  - Prescribed Augmentin  BID for 10 days - Continue symptomatic care - Follow up if symptoms acutely worsen, no improvement over the next 2-3 days, fever develops, or for  any other concerns - Continue inhaler as prescribed         Meds ordered this encounter  Medications   amoxicillin -clavulanate (AUGMENTIN ) 875-125 MG tablet    Sig: Take 1 tablet by mouth 2 (two) times daily for 10 days.    Dispense:  20 tablet    Refill:  0    Supervising Provider:   JOLINDA NORENE HERO [[id number]]    Return if symptoms worsen or fail to improve.  Oneil LELON Severin, FNP [facility]

## 2024-05-29 ENCOUNTER — Ambulatory Visit (INDEPENDENT_AMBULATORY_CARE_PROVIDER_SITE_OTHER)

## 2024-05-29 DIAGNOSIS — J302 Other seasonal allergic rhinitis: Secondary | ICD-10-CM | POA: Diagnosis not present

## 2024-05-29 DIAGNOSIS — J309 Allergic rhinitis, unspecified: Secondary | ICD-10-CM | POA: Diagnosis not present

## 2024-06-21 ENCOUNTER — Ambulatory Visit

## 2024-06-21 DIAGNOSIS — J302 Other seasonal allergic rhinitis: Secondary | ICD-10-CM | POA: Diagnosis not present

## 2024-06-21 DIAGNOSIS — J309 Allergic rhinitis, unspecified: Secondary | ICD-10-CM

## 2024-06-29 NOTE — Progress Notes (Signed)
 Misty Clark                                          MRN: [id number]   06/29/2024   The [facility] Quality Team Specialist reviewed this patient medical record for the purposes of chart review for care gap closure. The following were reviewed: chart review for care gap closure-controlling blood pressure.    [facility] Quality Team

## 2024-07-03 ENCOUNTER — Ambulatory Visit (INDEPENDENT_AMBULATORY_CARE_PROVIDER_SITE_OTHER)

## 2024-07-03 DIAGNOSIS — J302 Other seasonal allergic rhinitis: Secondary | ICD-10-CM | POA: Diagnosis not present

## 2024-07-03 DIAGNOSIS — J309 Allergic rhinitis, unspecified: Secondary | ICD-10-CM

## 2024-07-19 ENCOUNTER — Ambulatory Visit: Admitting: *Deleted

## 2024-07-19 DIAGNOSIS — J302 Other seasonal allergic rhinitis: Secondary | ICD-10-CM | POA: Diagnosis not present

## 2024-07-20 NOTE — Progress Notes (Signed)
 Misty Clark                                          MRN: [id number]   07/20/2024   The [facility] Quality Team Specialist reviewed this patient medical record for the purposes of chart review for care gap closure. The following were reviewed: chart review for care gap closure-breast cancer screening.    [facility] Quality Team

## 2024-07-26 ENCOUNTER — Ambulatory Visit

## 2024-07-26 ENCOUNTER — Other Ambulatory Visit: Payer: Self-pay | Admitting: Family Medicine

## 2024-07-26 DIAGNOSIS — J302 Other seasonal allergic rhinitis: Secondary | ICD-10-CM

## 2024-08-06 ENCOUNTER — Ambulatory Visit: Admitting: Family

## 2024-08-10 ENCOUNTER — Other Ambulatory Visit: Payer: Self-pay | Admitting: Allergy & Immunology

## 2024-08-27 ENCOUNTER — Ambulatory Visit: Payer: Self-pay | Admitting: Family Medicine

## 2024-08-30 ENCOUNTER — Ambulatory Visit: Admitting: Allergy & Immunology
